# Patient Record
Sex: Male | Born: 1950 | Race: Black or African American | Hispanic: No | Marital: Single | Smoking: Current every day smoker
Health system: Southern US, Community
[De-identification: ages and names within clinical notes are randomized; demographics above are authoritative.]

## PROBLEM LIST (undated history)

## (undated) DIAGNOSIS — C3490 Malignant neoplasm of unspecified part of unspecified bronchus or lung: Secondary | ICD-10-CM

## (undated) DIAGNOSIS — B1920 Unspecified viral hepatitis C without hepatic coma: Secondary | ICD-10-CM

## (undated) DIAGNOSIS — J439 Emphysema, unspecified: Secondary | ICD-10-CM

## (undated) DIAGNOSIS — T7840XA Allergy, unspecified, initial encounter: Secondary | ICD-10-CM

## (undated) DIAGNOSIS — J449 Chronic obstructive pulmonary disease, unspecified: Secondary | ICD-10-CM

## (undated) DIAGNOSIS — F17210 Nicotine dependence, cigarettes, uncomplicated: Secondary | ICD-10-CM

## (undated) DIAGNOSIS — F1010 Alcohol abuse, uncomplicated: Secondary | ICD-10-CM

## (undated) DIAGNOSIS — N189 Chronic kidney disease, unspecified: Secondary | ICD-10-CM

## (undated) HISTORY — DX: Emphysema, unspecified: J43.9

## (undated) HISTORY — DX: Allergy, unspecified, initial encounter: T78.40XA

## (undated) HISTORY — DX: Chronic kidney disease, unspecified: N18.9

---

## 1998-02-26 ENCOUNTER — Emergency Department (HOSPITAL_COMMUNITY): Admission: EM | Admit: 1998-02-26 | Discharge: 1998-02-26 | Payer: Self-pay | Admitting: Emergency Medicine

## 1998-02-26 ENCOUNTER — Encounter: Payer: Self-pay | Admitting: Emergency Medicine

## 1998-03-04 ENCOUNTER — Emergency Department (HOSPITAL_COMMUNITY): Admission: EM | Admit: 1998-03-04 | Discharge: 1998-03-04 | Payer: Self-pay | Admitting: Emergency Medicine

## 1998-03-05 ENCOUNTER — Encounter: Payer: Self-pay | Admitting: Emergency Medicine

## 2004-06-02 ENCOUNTER — Emergency Department (HOSPITAL_COMMUNITY): Admission: EM | Admit: 2004-06-02 | Discharge: 2004-06-02 | Payer: Self-pay | Admitting: Emergency Medicine

## 2005-12-02 ENCOUNTER — Emergency Department (HOSPITAL_COMMUNITY): Admission: EM | Admit: 2005-12-02 | Discharge: 2005-12-02 | Payer: Self-pay | Admitting: Emergency Medicine

## 2006-11-13 ENCOUNTER — Emergency Department (HOSPITAL_COMMUNITY): Admission: EM | Admit: 2006-11-13 | Discharge: 2006-11-13 | Payer: Self-pay | Admitting: Emergency Medicine

## 2008-07-24 ENCOUNTER — Emergency Department (HOSPITAL_COMMUNITY): Admission: EM | Admit: 2008-07-24 | Discharge: 2008-07-25 | Payer: Self-pay | Admitting: Emergency Medicine

## 2008-07-25 ENCOUNTER — Inpatient Hospital Stay (HOSPITAL_COMMUNITY): Admission: RE | Admit: 2008-07-25 | Discharge: 2008-07-31 | Payer: Self-pay | Admitting: Psychiatry

## 2008-07-25 ENCOUNTER — Ambulatory Visit: Payer: Self-pay | Admitting: Psychiatry

## 2008-08-03 ENCOUNTER — Emergency Department (HOSPITAL_COMMUNITY): Admission: EM | Admit: 2008-08-03 | Discharge: 2008-08-04 | Payer: Self-pay | Admitting: Emergency Medicine

## 2008-08-05 ENCOUNTER — Inpatient Hospital Stay (HOSPITAL_COMMUNITY): Admission: EM | Admit: 2008-08-05 | Discharge: 2008-08-08 | Payer: Self-pay | Admitting: Psychiatry

## 2009-01-18 ENCOUNTER — Emergency Department (HOSPITAL_COMMUNITY): Admission: EM | Admit: 2009-01-18 | Discharge: 2009-01-19 | Payer: Self-pay | Admitting: Emergency Medicine

## 2009-01-19 ENCOUNTER — Ambulatory Visit: Payer: Self-pay | Admitting: Psychiatry

## 2009-01-19 ENCOUNTER — Inpatient Hospital Stay (HOSPITAL_COMMUNITY): Admission: AD | Admit: 2009-01-19 | Discharge: 2009-01-28 | Payer: Self-pay | Admitting: Psychiatry

## 2010-06-04 LAB — URINALYSIS, ROUTINE W REFLEX MICROSCOPIC
Glucose, UA: NEGATIVE mg/dL
Hgb urine dipstick: NEGATIVE
Nitrite: NEGATIVE
Protein, ur: NEGATIVE mg/dL
Specific Gravity, Urine: 1.026 (ref 1.005–1.030)
Urobilinogen, UA: 1 mg/dL (ref 0.0–1.0)
pH: 6 (ref 5.0–8.0)

## 2010-06-04 LAB — RAPID URINE DRUG SCREEN, HOSP PERFORMED
Amphetamines: NOT DETECTED
Barbiturates: NOT DETECTED
Benzodiazepines: NOT DETECTED
Opiates: NOT DETECTED

## 2010-06-04 LAB — DIFFERENTIAL
Basophils Relative: 1 % (ref 0–1)
Eosinophils Absolute: 0.1 10*3/uL (ref 0.0–0.7)
Monocytes Relative: 16 % — ABNORMAL HIGH (ref 3–12)
Neutrophils Relative %: 53 % (ref 43–77)

## 2010-06-04 LAB — BASIC METABOLIC PANEL
BUN: 8 mg/dL (ref 6–23)
CO2: 28 mEq/L (ref 19–32)
Chloride: 101 mEq/L (ref 96–112)
Creatinine, Ser: 1.15 mg/dL (ref 0.4–1.5)
Glucose, Bld: 123 mg/dL — ABNORMAL HIGH (ref 70–99)

## 2010-06-04 LAB — CBC
MCHC: 34.3 g/dL (ref 30.0–36.0)
MCV: 98.4 fL (ref 78.0–100.0)
Platelets: 62 10*3/uL — ABNORMAL LOW (ref 150–400)
RBC: 4.37 MIL/uL (ref 4.22–5.81)
RDW: 13.5 % (ref 11.5–15.5)

## 2010-06-09 LAB — DIFFERENTIAL
Basophils Absolute: 0.1 10*3/uL (ref 0.0–0.1)
Basophils Relative: 1 % (ref 0–1)
Eosinophils Absolute: 0.1 10*3/uL (ref 0.0–0.7)
Eosinophils Relative: 2 % (ref 0–5)
Lymphocytes Relative: 56 % — ABNORMAL HIGH (ref 12–46)

## 2010-06-09 LAB — URINALYSIS, ROUTINE W REFLEX MICROSCOPIC
Bilirubin Urine: NEGATIVE
Ketones, ur: NEGATIVE mg/dL
Nitrite: NEGATIVE
Protein, ur: NEGATIVE mg/dL
Urobilinogen, UA: 0.2 mg/dL (ref 0.0–1.0)

## 2010-06-09 LAB — ACETAMINOPHEN LEVEL: Acetaminophen (Tylenol), Serum: <10 ug/mL — ABNORMAL LOW (ref 10–30)

## 2010-06-09 LAB — BASIC METABOLIC PANEL
BUN: 9 mg/dL (ref 6–23)
GFR calc non Af Amer: >60 mL/min (ref >60)
Glucose, Bld: 89 mg/dL (ref 70–99)
Potassium: 3.9 mEq/L (ref 3.5–5.1)

## 2010-06-09 LAB — SALICYLATE LEVEL: Salicylate Lvl: <4 mg/dL (ref 2.8–20.0)

## 2010-06-09 LAB — RAPID URINE DRUG SCREEN, HOSP PERFORMED: Tetrahydrocannabinol: NOT DETECTED

## 2010-06-09 LAB — CBC
HCT: 40.2 % (ref 39.0–52.0)
MCV: 99.5 fL (ref 78.0–100.0)
Platelets: 98 10*3/uL — ABNORMAL LOW (ref 150–400)
RDW: 14 % (ref 11.5–15.5)

## 2010-06-10 LAB — COMPREHENSIVE METABOLIC PANEL
ALT: 90 U/L — ABNORMAL HIGH (ref 0–53)
Alkaline Phosphatase: 107 U/L (ref 39–117)
BUN: 5 mg/dL — ABNORMAL LOW (ref 6–23)
CO2: 25 mEq/L (ref 19–32)
GFR calc non Af Amer: >60 mL/min (ref >60)
Glucose, Bld: 99 mg/dL (ref 70–99)
Potassium: 4.1 mEq/L (ref 3.5–5.1)
Total Bilirubin: 0.8 mg/dL (ref 0.3–1.2)
Total Protein: 7.4 g/dL (ref 6.0–8.3)

## 2010-06-10 LAB — DIFFERENTIAL
Basophils Absolute: 0.1 10*3/uL (ref 0.0–0.1)
Basophils Relative: 1 % (ref 0–1)
Eosinophils Absolute: 0.1 10*3/uL (ref 0.0–0.7)
Monocytes Relative: 11 % (ref 3–12)
Neutro Abs: 2 10*3/uL (ref 1.7–7.7)
Neutrophils Relative %: 29 % — ABNORMAL LOW (ref 43–77)

## 2010-06-10 LAB — URINALYSIS, ROUTINE W REFLEX MICROSCOPIC
Nitrite: NEGATIVE
Specific Gravity, Urine: 1.012 (ref 1.005–1.030)
Urobilinogen, UA: 0.2 mg/dL (ref 0.0–1.0)
pH: 5.5 (ref 5.0–8.0)

## 2010-06-10 LAB — CBC
HCT: 43.8 % (ref 39.0–52.0)
Hemoglobin: 14.4 g/dL (ref 13.0–17.0)
RBC: 4.46 MIL/uL (ref 4.22–5.81)
RDW: 13.3 % (ref 11.5–15.5)

## 2010-06-10 LAB — RAPID URINE DRUG SCREEN, HOSP PERFORMED
Cocaine: NOT DETECTED
Opiates: NOT DETECTED

## 2010-06-10 LAB — ETHANOL: Alcohol, Ethyl (B): 300 mg/dL — ABNORMAL HIGH (ref 0–10)

## 2010-07-15 NOTE — H&P (Signed)
Chad Sparks, Chad Sparks              ACCOUNT NO.:  [id number]   MEDICAL RECORD NO.:  [id number]          PATIENT TYPE:  IPS   LOCATION:  0407                          FACILITY:  [facility]   PHYSICIAN:  Anselm Jungling, MD  DATE OF BIRTH:  1950-12-23   DATE OF ADMISSION:  08/05/2008  DATE OF DISCHARGE:                       PSYCHIATRIC ADMISSION ASSESSMENT   This is a voluntary admission to the services of Dr. Geralyn Flash.   Chad Sparks just left [place] on June 1.  He was also admitted at that time  with substance abuse issues, as again is his presentation today.  His  alcohol level is 225.  He does have benzodiazepines in his system.  He  was discharged on Ambien, so it is possible from that.  Chad Sparks  presented to the [facility] ED yesterday with a chief complaint of  medical clearance.  He states that he has been having increased  flashbacks and hallucinations, that he has suicidal ideations, that he  took 2 Prazosin tablets last night and 3 tablets this morning.  He took  this in an effort to fall asleep and stay that way.  He denied being  homicidal.  He denied drug use.  He admits to having drank 3 beers  earlier today, and he states that the reason for the ingestion was his  desire to sleep.  Patient is known to be an alcohol abuser if not  dependant, and he stated that he has been having auditory hallucinations  as well as visual hallucinations.  Due to his prior history, he was  admitted.   PAST PSYCHIATRIC HISTORY:  As already stated, he was most recently with  [place] from May 26 to June 1.  Again, he was admitted due to increasing  polysubstance abuse, reported auditory hallucinations, and possible  suicide risk.   FAMILY HISTORY:  None.   SOCIAL HISTORY:  He is homeless.  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   ALCOHOL/DRUG USE:  When last with [place] a week ago, he had been drinking  about a case of beer a day.  He denied any history for seizures or  blackouts.  He did not have any during his stay here.  He denied any  cocaine or marijuana use.   DRUG ALLERGIES:  He does not have any.   MEDICATIONS AT DISCHARGE:  He was prescribed Seroquel 100 mg q.h.s. and  Ambien 10 mg p.r.n.   POSITIVE PHYSICAL FINDINGS:  He was medically cleared in the ED at  [facility].  His urine was negative.  His UDS was positive for  benzodiazepines, but he is prescribed the Ambien.  His alcohol level was  225.  His electrolytes had no abnormalities.  His CBC  had no abnormality  of hemoglobin and hematocrit.   MENTAL STATUS EXAM:  Tonight, he was dressed in hospital scrubs.  He  appears to be adequately groomed and nourished.  He walks unaided.  His  speech is not pressured.  His mood is somewhat irritable.  Earlier in  the day when Dr. Electa Sniff saw him, he covered his head up with the  covers.  He stirred when he was spoken to but chose not to respond.  His  thought processes today are clear, rational, and goal-oriented.  He is  doing whatever it takes to get some type of income started.  Judgment  and insight are fair.  Concentration and memory superficially are  intact.  Intelligence is at least superficially intact.  He is not  actively suicidal or homicidal.  He is not responding to internal  stimuli nor does he give any appearance of having any thought disorder.   DIAGNOSES:   AXIS I:  1. Alcohol abuse.  2. History for polysubstance abuse.  3. Alcohol-induced schizo-affective disorder.   AXIS II:  Deferred.   AXIS III:  No acute or chronic illnesses are documented.   AXIS IV:  Severe stressors, financial,  etc.   AXIS V:  55.   PLAN:  Help detox from alcohol.  Towards that end, he was given Librium  25 mg p.o. q.h.s. p.r.n. withdrawal.  His discharge medications of  Seroquel 100 q.h.s. and Ambien 10 mg q.h.s. were continued.  He states  that he is prescribed Prazosin through the [place]; however, that was filled  last year.   Tomorrow, you can call [phone], extension [phone], to get his benefits  status to see if in fact he is service-connected or eligible for a non-  service-connected pension.  You can also check to see if he is eligible  for the substance abuse treatment program there called [facility].   Estimated length of stay is 3 to 5 days.      Chad Sparks, P.A.-C.      Anselm Jungling, MD  Electronically Signed    MD/MEDQ  D:  08/06/2008  T:  08/06/2008  Job:  [phone]

## 2010-07-15 NOTE — H&P (Signed)
NAMEJIAIRE, Chad Sparks              ACCOUNT NO.:  [id number]   MEDICAL RECORD NO.:  [id number]          PATIENT TYPE:  IPS   LOCATION:  0406                          FACILITY:  [facility]   PHYSICIAN:  Anselm Jungling, MD  DATE OF BIRTH:  10-Mar-1950   DATE OF ADMISSION:  07/24/2008  DATE OF DISCHARGE:                       PSYCHIATRIC ADMISSION ASSESSMENT   This is a 60 year old male voluntarily on Jul 24, 2008.   HISTORY OF PRESENT ILLNESS:  The patient is here with auditory  hallucinations and relapsed on alcohol.  States that he just started  back about on mother's day, drinking a case a day.  Last drink was  yesterday.  Also having suicidal ideation with a plan to shoot self in  the mouth.  He stated his  brother wrestled it away from him.  The  patient states that his brother found out that he was on the streets and  had a gun at that time.  The gun is not in the patient's possessions, as  stated per patient.  He has had a poor appetite, has lost some weight,  has been on medications prior but states that it makes his suicidal  thoughts worse.   PAST PSYCHIATRIC HISTORY:  First admission to the [facility].  No current outpatient mental health treatment.   SOCIAL HISTORY:  The patient considers himself homeless.  He has a poor  social support.   FAMILY HISTORY:  None.   ALCOHOL AND DRUG HISTORY:  As above.  Has been drinking about a case a  day.  Denies any seizures or blackouts.  Denies any substance use.  Primary care Filomeno Cromley is [facility].   MEDICAL PROBLEMS:  Hepatitis C.   MEDICATIONS:  Has been on Seroquel and albuterol in the past.   DRUG ALLERGIES:  ALLERGIC TO BEE STINGS.   PHYSICAL EXAMINATION:  GENERAL:  This is a disheveled thin male fully  assessed at [facility] emergency department.  The patient is in no  distress.  He offers no complaints other than feeling tired.  VITAL SIGNS:  Blood pressure is 123/86, 89 heart rate, O2 sat is 93%.   His urinalysis is negative.  CBC within normal limits.  Platelet count  of 142.  Urine drug screen is negative.  His CMET shows elevated liver  enzymes with an AST of 140, ALT of 90.   MENTAL STATUS EXAM:  The patient is in the bed.  He is disheveled.  Poor  eye contact.  He offers little information.  Speech is soft-spoken.  Again, the few words spoken are clear.  Mood is tired and depressed.  The patient's affect reflects mood.  Thought process - endorsing  auditory hallucinations.  Does not appear to be actively responding.  Answers he provides are coherent.  Cognitive function intact.  His  memory appears intact.  Judgment and insight are fair.   IMPRESSION:  AXIS I:  Psychosis, polysubstance abuse.  AXIS II:  Deferred.  AXIS III:  Hepatitis C.  AXIS IV:  Problems with housing and other psychosocial problems,  possible medical problems.  AXIS V:  Current is 35.   PLAN:  Our plan is to contract for safety, stabilize his mood and  thinking.  We will put patient on Librium protocol, work on relapse  prevention.  Continue to assess comorbidities, as well as Seroquel  available on a p.r.n. basis, and will identify his support group.  [facility].  Tentative length of stay at this time is 4-6 days.      Landry Corporal, N.P.      Anselm Jungling, MD  Electronically Signed    JO/MEDQ  D:  07/25/2008  T:  07/25/2008  Job:  [phone]

## 2010-07-15 NOTE — Discharge Summary (Signed)
Chad Sparks, BURGARD              ACCOUNT NO.:  [id number]   MEDICAL RECORD NO.:  [id number]          PATIENT TYPE:  IPS   LOCATION:  0406                          FACILITY:  [facility]   PHYSICIAN:  Anselm Jungling, MD  DATE OF BIRTH:  24-Jul-1950   DATE OF ADMISSION:  07/25/2008  DATE OF DISCHARGE:  07/31/2008                               DISCHARGE SUMMARY   IDENTIFYING DATA AND REASON FOR ADMISSION:  This was an inpatient  psychiatric admission for Brysin, a 60 year old unmarried African  American male who was admitted due to increasing polysubstance abuse,  depression, auditory hallucinations, with possible suicide risk.  Please  refer to the admission note for further details pertaining to the  symptoms, circumstances and history that led to his hospitalization.  He  was given an initial Axis I diagnosis of psychosis NOS, and  polysubstance abuse.   MEDICAL AND LABORATORY:  The patient was in good health without any  active or chronic medical problems.  He was medically and physically  assessed by the psychiatric nurse practitioner.  There were no  significant medical issues.   HOSPITAL COURSE:  The patient was admitted to the adult inpatient  psychiatric service.  He presented as a well-nourished, normally-  developed male who complained of feeling tired.  He was relatively  noncommunicative in the initial interview, remaining in bed, with the  covers up over his head, no eye contact, and only minimal verbal  responses.  However, he indicated that he was okay.   He agreed to Seroquel as a medication to help stabilize his sleep,  irritability and agitation.  He tolerated 100 mg nightly well.   He continued fairly withdrawn and guarded, but this did not appear to be  a function of psychosis.  In fact, it appeared to be more function of  his personality and characterologic issues.  The patient is apparently a  [place] Veteran with significant post-traumatic issues, and a strong  inclination against seeking services through the [facility].   He was fearful about going to a shelter and being homeless, but was  extremely particular about what form of housing situation we would find  for him.  We attempted to find resources for him through the [facility], but they were not able to work with him.   The patient was discharged on the seventh hospital day.  He stated that  he had a dental appointment in [facility] for August 01, 2008, and he was  very motivated to make that appointment, as he had significant dental  work to be done.  He consented to be discharged on the seventh hospital  day, even though we had not been able to arrange any housing for him  other than the shelter.  Nonetheless, this was his preference, to leave.   DISCHARGE AND AFTERCARE PLAN:  The patient was to follow-up for  medication management at [facility] with an  appointment to see their psychiatrist on August 02, 2008, at 9:00 a.m.   DISCHARGE MEDICATIONS:  1. Seroquel 100 mg nightly.  2. Ambien 10  mg h.s. p.r.n. insomnia.   DISCHARGE DIAGNOSES:  Axis I:  Schizoaffective disorder, not otherwise  specified and history of polysubstance abuse.  Axis II:  Deferred.  Axis III:  No acute or chronic illnesses.  Axis IV:  Stressors severe.  Axis V:  Global Assessment of Functioning on discharge 55.      Anselm Jungling, MD  Electronically Signed     SPB/MEDQ  D:  08/01/2008  T:  08/01/2008  Job:  [id number]

## 2010-07-18 NOTE — Discharge Summary (Signed)
NAMESHIRO, ELLERMAN              ACCOUNT NO.:  [id number]   MEDICAL RECORD NO.:  [id number]          PATIENT TYPE:  IPS   LOCATION:  0407                          FACILITY:  [facility]   PHYSICIAN:  Anselm Jungling, MD  DATE OF BIRTH:  Jan 18, 1951   DATE OF ADMISSION:  08/05/2008  DATE OF DISCHARGE:  08/08/2008                               DISCHARGE SUMMARY   IDENTIFYING DATA AND REASON FOR ADMISSION:  This was an inpatient  psychiatric admission for Jonavan, a 60 year old single African American  male who had just been discharged from our service 5 days prior to this  readmission.  He returned with a blood alcohol level of 225, and with  benzodiazepines in his system.  He re-presented to [facility]  Emergency Department.  Please refer to the admission note for further  details pertaining to the symptoms, circumstances and history that led  to his hospitalization.  He was given an initial Axis I diagnosis of  alcohol abuse, polysubstance abuse, and rule out schizoaffective  disorder.   MEDICAL AND LABORATORY:  The patient was medically and physically  assessed in the emergency department, and then reassessed by the  psychiatric nurse practitioner upon arrival on the inpatient psychiatry  service.  He was in generally good health without any active or chronic  medical problems.  There were no significant medical issues.   HOSPITAL COURSE:  The patient was admitted to the adult inpatient  psychiatric service.  He presented as a tall, adequately nourished adult  male who complained of auditory and visual hallucinations and suicidal  ideation, but did not appear to be psychotic in any way.  During the  initial interview he remained in bed with covers up over his head, and  although he appeared to be awake, chose not to respond.   He was treated with Librium on an as-needed basis for emergence of  alcohol withdrawal symptoms.  He was encouraged to participate in  therapeutic  groups and activities.   On the fourth hospital day, we were able to arrange for his transfer to  the [facility] in [place], [place].   DISCHARGE DIAGNOSES:  AXIS I:  Alcohol dependence not otherwise  specified and rule out schizoaffective disorder not otherwise specified.  AXIS II:  Deferred.  AXIS III:  No acute or chronic illnesses.  AXIS IV:  Stressors severe.  AXIS V: GAF on discharge 45.      Anselm Jungling, MD  Electronically Signed     SPB/MEDQ  D:  08/21/2008  T:  08/21/2008  Job:  [id number]

## 2010-12-12 LAB — URINALYSIS, ROUTINE W REFLEX MICROSCOPIC
Nitrite: NEGATIVE
Protein, ur: NEGATIVE
Specific Gravity, Urine: 1.023
Urobilinogen, UA: >8 — ABNORMAL HIGH

## 2010-12-12 LAB — DIFFERENTIAL
Basophils Relative: 1
Eosinophils Absolute: 0.1
Eosinophils Relative: 2
Lymphocytes Relative: 39
Neutro Abs: 1.8
Neutrophils Relative %: 38 — ABNORMAL LOW

## 2010-12-12 LAB — COMPREHENSIVE METABOLIC PANEL
ALT: 95 — ABNORMAL HIGH
Albumin: 3 — ABNORMAL LOW
Alkaline Phosphatase: 140 — ABNORMAL HIGH
Calcium: 8.9
Glucose, Bld: 88
Potassium: 4.3
Sodium: 136
Total Protein: 7.1

## 2010-12-12 LAB — URINE MICROSCOPIC-ADD ON

## 2010-12-12 LAB — CBC
MCHC: 34.5
Platelets: 77 — ABNORMAL LOW
RDW: 14.2 — ABNORMAL HIGH

## 2014-10-14 ENCOUNTER — Inpatient Hospital Stay (HOSPITAL_COMMUNITY)
Admission: EM | Admit: 2014-10-14 | Discharge: 2014-10-19 | DRG: 378 | Disposition: A | Discharge status: Skilled Nursing Facility | Payer: Medicare Other | Attending: Internal Medicine | Admitting: Internal Medicine

## 2014-10-14 ENCOUNTER — Encounter (HOSPITAL_COMMUNITY): Payer: Self-pay | Admitting: Emergency Medicine

## 2014-10-14 DIAGNOSIS — J449 Chronic obstructive pulmonary disease, unspecified: Secondary | ICD-10-CM | POA: Diagnosis present

## 2014-10-14 DIAGNOSIS — E872 Acidosis, unspecified: Secondary | ICD-10-CM

## 2014-10-14 DIAGNOSIS — D649 Anemia, unspecified: Secondary | ICD-10-CM

## 2014-10-14 DIAGNOSIS — R7401 Elevation of levels of liver transaminase levels: Secondary | ICD-10-CM

## 2014-10-14 DIAGNOSIS — F1020 Alcohol dependence, uncomplicated: Secondary | ICD-10-CM

## 2014-10-14 DIAGNOSIS — R740 Nonspecific elevation of levels of transaminase and lactic acid dehydrogenase [LDH]: Secondary | ICD-10-CM

## 2014-10-14 DIAGNOSIS — K922 Gastrointestinal hemorrhage, unspecified: Principal | ICD-10-CM | POA: Diagnosis present

## 2014-10-14 DIAGNOSIS — N183 Chronic kidney disease, stage 3 (moderate): Secondary | ICD-10-CM | POA: Diagnosis present

## 2014-10-14 DIAGNOSIS — F17210 Nicotine dependence, cigarettes, uncomplicated: Secondary | ICD-10-CM

## 2014-10-14 DIAGNOSIS — B1920 Unspecified viral hepatitis C without hepatic coma: Secondary | ICD-10-CM | POA: Diagnosis present

## 2014-10-14 DIAGNOSIS — J440 Chronic obstructive pulmonary disease with acute lower respiratory infection: Secondary | ICD-10-CM

## 2014-10-14 DIAGNOSIS — D696 Thrombocytopenia, unspecified: Secondary | ICD-10-CM | POA: Diagnosis present

## 2014-10-14 DIAGNOSIS — B1710 Acute hepatitis C without hepatic coma: Secondary | ICD-10-CM | POA: Diagnosis present

## 2014-10-14 DIAGNOSIS — N179 Acute kidney failure, unspecified: Secondary | ICD-10-CM | POA: Diagnosis present

## 2014-10-14 DIAGNOSIS — D61818 Other pancytopenia: Secondary | ICD-10-CM | POA: Diagnosis present

## 2014-10-14 DIAGNOSIS — D62 Acute posthemorrhagic anemia: Secondary | ICD-10-CM | POA: Diagnosis present

## 2014-10-14 DIAGNOSIS — K7031 Alcoholic cirrhosis of liver with ascites: Secondary | ICD-10-CM | POA: Diagnosis present

## 2014-10-14 DIAGNOSIS — R17 Unspecified jaundice: Secondary | ICD-10-CM

## 2014-10-14 DIAGNOSIS — F1010 Alcohol abuse, uncomplicated: Secondary | ICD-10-CM | POA: Diagnosis present

## 2014-10-14 HISTORY — DX: Alcohol abuse, uncomplicated: F10.10

## 2014-10-14 HISTORY — DX: Nicotine dependence, cigarettes, uncomplicated: F17.210

## 2014-10-14 HISTORY — DX: Chronic obstructive pulmonary disease, unspecified: J44.9

## 2014-10-14 HISTORY — DX: Unspecified viral hepatitis C without hepatic coma: B19.20

## 2014-10-14 LAB — CBC WITH DIFFERENTIAL/PLATELET
BASOS PCT: 0 % (ref 0–1)
Basophils Absolute: 0 10*3/uL (ref 0.0–0.1)
EOS PCT: 1 % (ref 0–5)
Eosinophils Absolute: 0 10*3/uL (ref 0.0–0.7)
HEMATOCRIT: 23 % — AB (ref 39.0–52.0)
HEMOGLOBIN: 7.8 g/dL — AB (ref 13.0–17.0)
Lymphocytes Relative: 44 % (ref 12–46)
Lymphs Abs: 1.7 10*3/uL (ref 0.7–4.0)
MCH: 33.8 pg (ref 26.0–34.0)
MCHC: 33.9 g/dL (ref 30.0–36.0)
MCV: 99.6 fL (ref 78.0–100.0)
MONO ABS: 0.4 10*3/uL (ref 0.1–1.0)
MONOS PCT: 11 % (ref 3–12)
NEUTROS PCT: 44 % (ref 43–77)
Neutro Abs: 1.7 10*3/uL (ref 1.7–7.7)
Platelets: 40 10*3/uL — ABNORMAL LOW (ref 150–400)
RBC: 2.31 MIL/uL — AB (ref 4.22–5.81)
RDW: 18.1 % — AB (ref 11.5–15.5)
WBC: 3.8 10*3/uL — AB (ref 4.0–10.5)

## 2014-10-14 LAB — COMPREHENSIVE METABOLIC PANEL
ALK PHOS: 114 U/L (ref 38–126)
ALT: 77 U/L — AB (ref 17–63)
ANION GAP: 9 (ref 5–15)
AST: 152 U/L — ABNORMAL HIGH (ref 15–41)
Albumin: 1.9 g/dL — ABNORMAL LOW (ref 3.5–5.0)
BILIRUBIN TOTAL: 2.5 mg/dL — AB (ref 0.3–1.2)
BUN: 12 mg/dL (ref 6–20)
CALCIUM: 8 mg/dL — AB (ref 8.9–10.3)
CHLORIDE: 110 mmol/L (ref 101–111)
CO2: 19 mmol/L — AB (ref 22–32)
CREATININE: 1.6 mg/dL — AB (ref 0.61–1.24)
GFR, EST AFRICAN AMERICAN: 51 mL/min — AB (ref >60)
GFR, EST NON AFRICAN AMERICAN: 44 mL/min — AB (ref >60)
Glucose, Bld: 125 mg/dL — ABNORMAL HIGH (ref 65–99)
POTASSIUM: 3.7 mmol/L (ref 3.5–5.1)
SODIUM: 138 mmol/L (ref 135–145)
Total Protein: 7.1 g/dL (ref 6.5–8.1)

## 2014-10-14 LAB — SAMPLE TO BLOOD BANK

## 2014-10-14 MED ORDER — SODIUM CHLORIDE 0.9 % IV SOLN
10.0000 mL/h | Freq: Once | INTRAVENOUS | Status: AC
  Administered 2014-10-15: 10 mL/h via INTRAVENOUS

## 2014-10-14 MED ORDER — PANTOPRAZOLE SODIUM 40 MG IV SOLR
40.0000 mg | Freq: Once | INTRAVENOUS | Status: AC
  Administered 2014-10-14: 40 mg via INTRAVENOUS
  Filled 2014-10-14: qty 40

## 2014-10-14 MED ORDER — SODIUM CHLORIDE 0.9 % IV BOLUS (SEPSIS)
1000.0000 mL | Freq: Once | INTRAVENOUS | Status: AC
  Administered 2014-10-14: 1000 mL via INTRAVENOUS

## 2014-10-14 NOTE — ED Provider Notes (Signed)
CSN: [id number]     Arrival date & time 10/14/14  2043 History   This chart was scribed for Varney Biles, MD by Forrestine Him, ED Scribe. This patient was seen in room D35C/D35C and the patient's care was started 11:33 PM.   Chief Complaint  Patient presents with  . Hematemesis  . Hematochezia   The history is provided by the patient. No language interpreter was used.    HPI Comments: Chad Sparks is a 64 y.o. male with a PMHx of hepatitis C and COPD who presents to the Emergency Department complaining of intermittent, ongoing hematemesis x 4 days. He reports 3 episodes of black colored emesis in the last 24 hours. Pt also reports 3 episodes of tarry/sticky hematochezia in the last day. Reports 5 episodes of each since onset of symptoms. No previous history of same. Last colonoscopy more than 2 years ago without any abnormalities. Chad Sparks admits to having an alcohol problem. States he consumes a few 40 ounce bottles of beer daily. Last drink earlier today. No previous history of ulcers. He is not currently on any anticoagulants or ASA. No known allergies to medications   PCP: [facility]  Past Medical History  Diagnosis Date  . Hepatitis C   . COPD (chronic obstructive pulmonary disease)   . Heavy cigarette smoker   . Alcohol abuse    History reviewed. No pertinent past surgical history. No family history on file. Social History  Substance Use Topics  . Smoking status: Current Every Day Smoker  . Smokeless tobacco: None  . Alcohol Use: Yes    Review of Systems  Constitutional: Negative for fever and chills.  Respiratory: Negative for cough and shortness of breath.   Gastrointestinal: Positive for vomiting, diarrhea and blood in stool. Negative for abdominal pain.  Musculoskeletal: Negative for arthralgias.  Skin: Positive for rash.  Neurological: Positive for weakness.  Psychiatric/Behavioral: Negative for confusion.  All other systems reviewed and are  negative.     Allergies  Review of patient's allergies indicates no known allergies.  Home Medications   Prior to Admission medications   Not on File   Triage Vitals: BP 141/93 mmHg  Pulse 101  Temp(Src) 98.8 F (37.1 C) (Oral)  Resp 13  SpO2 100%   Physical Exam  Constitutional: He is oriented to person, place, and time. He appears well-developed and well-nourished.  HENT:  Head: Normocephalic and atraumatic.  Positive oral thrush   Eyes: EOM are normal. Scleral icterus (mild) is present.  Neck: Normal range of motion.  Cardiovascular: Regular rhythm, normal heart sounds and intact distal pulses.  Tachycardia present.   Pulmonary/Chest: Effort normal and breath sounds normal. No respiratory distress.  Lungs are clear to ausculation   Abdominal: Soft. He exhibits no distension and no mass. There is no tenderness.  Genitourinary:  Melanotic stools   Musculoskeletal: Normal range of motion.  Neurological: He is alert and oriented to person, place, and time.  Skin: Skin is warm and dry.  Psychiatric: He has a normal mood and affect. Judgment normal.  Nursing note and vitals reviewed.   ED Course  Procedures (including critical care time)  DIAGNOSTIC STUDIES: Oxygen Saturation is 99% on RA, Normal  by my interpretation.    COORDINATION OF CARE: 11:42 PM- Wil order CBC, CMP,  APTT, i-stat CG4 lactic acid, and PT-INR. Will give Ativan, Vitamin B-1, Sandostatin. Discussed treatment plan with pt at bedside and pt agreed to plan.     Labs Review Labs  Reviewed  CBC WITH DIFFERENTIAL/PLATELET - Abnormal; Notable for the following:    WBC 3.8 (*)    RBC 2.31 (*)    Hemoglobin 7.8 (*)    HCT 23.0 (*)    RDW 18.1 (*)    Platelets 40 (*)    All other components within normal limits  COMPREHENSIVE METABOLIC PANEL - Abnormal; Notable for the following:    CO2 19 (*)    Glucose, Bld 125 (*)    Creatinine, Ser 1.60 (*)    Calcium 8.0 (*)    Albumin 1.9 (*)    AST 152 (*)     ALT 77 (*)    Total Bilirubin 2.5 (*)    GFR calc non Af Amer 44 (*)    GFR calc Af Amer 51 (*)    All other components within normal limits  APTT - Abnormal; Notable for the following:    aPTT 45 (*)    All other components within normal limits  PROTIME-INR - Abnormal; Notable for the following:    Prothrombin Time 19.9 (*)    INR 1.69 (*)    All other components within normal limits  I-STAT CG4 LACTIC ACID, ED - Abnormal; Notable for the following:    Lactic Acid, Venous 3.46 (*)    All other components within normal limits  POC OCCULT BLOOD, ED - Abnormal; Notable for the following:    Fecal Occult Bld POSITIVE (*)    All other components within normal limits  HEPATITIS PANEL, ACUTE  HCV RNA QUANT  BILIRUBIN, DIRECT  CBC  CBC  CBC  CBC  LACTIC ACID, PLASMA  SAMPLE TO BLOOD BANK  TYPE AND SCREEN  PREPARE RBC (CROSSMATCH)  ABO/RH    Imaging Review No results found. I personally reviewed and evaluated these images and lab results as part of my medical decision-making.   EKG Interpretation None      MDM   Final diagnoses:  Acute upper GI bleed  Symptomatic anemia  Alcoholism  Elevated transaminase level  Elevated bilirubin  Acute hepatitis C virus infection without hepatic coma  Chronic obstructive pulmonary disease with acute lower respiratory infection  Heavy cigarette smoker  Lactic acidosis    CRITICAL CARE Performed by: Varney Biles   Total critical care time: 37 minutes  Critical care time was exclusive of separately billable procedures and treating other patients.  Critical care was necessary to treat or prevent imminent or life-threatening deterioration.  Critical care was time spent personally by me on the following activities: development of treatment plan with patient and/or surrogate as well as nursing, discussions with consultants, evaluation of patient's response to treatment, examination of patient, obtaining history from patient  or surrogate, ordering and performing treatments and interventions, ordering and review of laboratory studies, ordering and review of radiographic studies, pulse oximetry and re-evaluation of patient's condition.   Pt comes in with cc of GI bleed.  DDx includes: Esophagitis Mallory Weiss tear [place]  Variceal bleeding PUD/Gastritis/ulcers Diverticular bleed Colon cancer Rectal bleed Internal hemorrhoids External hemorrhoids  Pt with cc of GI bleed - melena and hematochezia. Hx of liver alcoholism and hepatitis. He has melena on our exam and Hb is low. Pt has malaise with a Hb of 7.2. Will transfuse- suspect upper GI bleed right now. We will give him protonix iv 80 mg bolus and also start him on octreotide, as varices are possible. Will give him vit k as well.    Varney Biles, MD 10/15/14 [phone]

## 2014-10-14 NOTE — ED Notes (Addendum)
Pt. reports multiple bloody emesis and diarrhea onset last week with fatigue and generalized weakness , denies in jury , no abdominal pain / no fever. + ETOH today .

## 2014-10-15 ENCOUNTER — Encounter (HOSPITAL_COMMUNITY): Payer: Self-pay | Admitting: Internal Medicine

## 2014-10-15 ENCOUNTER — Encounter (HOSPITAL_COMMUNITY)
Admission: EM | Disposition: A | Discharge status: Skilled Nursing Facility | Payer: Self-pay | Source: Home / Self Care | Attending: Internal Medicine

## 2014-10-15 ENCOUNTER — Inpatient Hospital Stay (HOSPITAL_COMMUNITY): Payer: Medicare Other | Admitting: Anesthesiology

## 2014-10-15 ENCOUNTER — Inpatient Hospital Stay (HOSPITAL_COMMUNITY): Payer: Medicare Other

## 2014-10-15 DIAGNOSIS — K922 Gastrointestinal hemorrhage, unspecified: Principal | ICD-10-CM | POA: Insufficient documentation

## 2014-10-15 DIAGNOSIS — B1710 Acute hepatitis C without hepatic coma: Secondary | ICD-10-CM | POA: Insufficient documentation

## 2014-10-15 DIAGNOSIS — K7031 Alcoholic cirrhosis of liver with ascites: Secondary | ICD-10-CM | POA: Diagnosis present

## 2014-10-15 DIAGNOSIS — E872 Acidosis: Secondary | ICD-10-CM | POA: Diagnosis present

## 2014-10-15 DIAGNOSIS — D61818 Other pancytopenia: Secondary | ICD-10-CM | POA: Diagnosis present

## 2014-10-15 DIAGNOSIS — N183 Chronic kidney disease, stage 3 (moderate): Secondary | ICD-10-CM | POA: Diagnosis present

## 2014-10-15 DIAGNOSIS — J449 Chronic obstructive pulmonary disease, unspecified: Secondary | ICD-10-CM

## 2014-10-15 DIAGNOSIS — F1010 Alcohol abuse, uncomplicated: Secondary | ICD-10-CM

## 2014-10-15 DIAGNOSIS — N179 Acute kidney failure, unspecified: Secondary | ICD-10-CM | POA: Diagnosis present

## 2014-10-15 DIAGNOSIS — B1920 Unspecified viral hepatitis C without hepatic coma: Secondary | ICD-10-CM | POA: Diagnosis present

## 2014-10-15 DIAGNOSIS — D62 Acute posthemorrhagic anemia: Secondary | ICD-10-CM | POA: Diagnosis present

## 2014-10-15 DIAGNOSIS — F17210 Nicotine dependence, cigarettes, uncomplicated: Secondary | ICD-10-CM

## 2014-10-15 DIAGNOSIS — D696 Thrombocytopenia, unspecified: Secondary | ICD-10-CM | POA: Diagnosis present

## 2014-10-15 DIAGNOSIS — K921 Melena: Secondary | ICD-10-CM

## 2014-10-15 DIAGNOSIS — F1020 Alcohol dependence, uncomplicated: Secondary | ICD-10-CM | POA: Diagnosis present

## 2014-10-15 HISTORY — PX: ESOPHAGOGASTRODUODENOSCOPY (EGD) WITH PROPOFOL: SHX5813

## 2014-10-15 LAB — COMPREHENSIVE METABOLIC PANEL
ALBUMIN: 1.6 g/dL — AB (ref 3.5–5.0)
ALT: 58 U/L (ref 17–63)
AST: 115 U/L — AB (ref 15–41)
Alkaline Phosphatase: 92 U/L (ref 38–126)
Anion gap: 9 (ref 5–15)
BILIRUBIN TOTAL: 2.9 mg/dL — AB (ref 0.3–1.2)
BUN: 10 mg/dL (ref 6–20)
CHLORIDE: 116 mmol/L — AB (ref 101–111)
CO2: 17 mmol/L — AB (ref 22–32)
Calcium: 7.3 mg/dL — ABNORMAL LOW (ref 8.9–10.3)
Creatinine, Ser: 1.53 mg/dL — ABNORMAL HIGH (ref 0.61–1.24)
GFR calc Af Amer: 53 mL/min — ABNORMAL LOW (ref >60)
GFR calc non Af Amer: 46 mL/min — ABNORMAL LOW (ref >60)
GLUCOSE: 81 mg/dL (ref 65–99)
POTASSIUM: 4.1 mmol/L (ref 3.5–5.1)
Sodium: 142 mmol/L (ref 135–145)
Total Protein: 5.6 g/dL — ABNORMAL LOW (ref 6.5–8.1)

## 2014-10-15 LAB — RAPID URINE DRUG SCREEN, HOSP PERFORMED
Amphetamines: NOT DETECTED
BARBITURATES: NOT DETECTED
Benzodiazepines: NOT DETECTED
COCAINE: NOT DETECTED
Opiates: NOT DETECTED
Tetrahydrocannabinol: POSITIVE — AB

## 2014-10-15 LAB — CBC
HCT: 28.7 % — ABNORMAL LOW (ref 39.0–52.0)
HEMOGLOBIN: 10 g/dL — AB (ref 13.0–17.0)
MCH: 32.4 pg (ref 26.0–34.0)
MCHC: 34.8 g/dL (ref 30.0–36.0)
MCV: 92.9 fL (ref 78.0–100.0)
Platelets: 30 10*3/uL — ABNORMAL LOW (ref 150–400)
RBC: 3.09 MIL/uL — AB (ref 4.22–5.81)
RDW: 21.2 % — ABNORMAL HIGH (ref 11.5–15.5)
WBC: 5.3 10*3/uL (ref 4.0–10.5)

## 2014-10-15 LAB — PREPARE RBC (CROSSMATCH)

## 2014-10-15 LAB — PROTIME-INR
INR: 1.69 — ABNORMAL HIGH (ref 0.00–1.49)
INR: 1.87 — ABNORMAL HIGH (ref 0.00–1.49)
Prothrombin Time: 19.9 seconds — ABNORMAL HIGH (ref 11.6–15.2)
Prothrombin Time: 21.4 seconds — ABNORMAL HIGH (ref 11.6–15.2)

## 2014-10-15 LAB — APTT
aPTT: 45 seconds — ABNORMAL HIGH (ref 24–37)
aPTT: 45 seconds — ABNORMAL HIGH (ref 24–37)

## 2014-10-15 LAB — POC OCCULT BLOOD, ED: Fecal Occult Bld: POSITIVE — AB

## 2014-10-15 LAB — CREATININE, URINE, RANDOM: Creatinine, Urine: 205.91 mg/dL

## 2014-10-15 LAB — MRSA PCR SCREENING: MRSA BY PCR: NEGATIVE

## 2014-10-15 LAB — I-STAT CG4 LACTIC ACID, ED: LACTIC ACID, VENOUS: 3.46 mmol/L — AB (ref 0.5–2.0)

## 2014-10-15 LAB — ABO/RH: ABO/RH(D): O POS

## 2014-10-15 LAB — SODIUM, URINE, RANDOM: Sodium, Ur: 91 mmol/L

## 2014-10-15 LAB — BILIRUBIN, DIRECT: Bilirubin, Direct: 1.6 mg/dL — ABNORMAL HIGH (ref 0.1–0.5)

## 2014-10-15 SURGERY — ESOPHAGOGASTRODUODENOSCOPY (EGD) WITH PROPOFOL
Anesthesia: Monitor Anesthesia Care

## 2014-10-15 MED ORDER — NICOTINE 21 MG/24HR TD PT24
21.0000 mg | MEDICATED_PATCH | Freq: Every day | TRANSDERMAL | Status: DC
  Filled 2014-10-15 (×4): qty 1

## 2014-10-15 MED ORDER — LIDOCAINE HCL (CARDIAC) 20 MG/ML IV SOLN
INTRAVENOUS | Status: DC | PRN
  Administered 2014-10-15: 50 mg via INTRAVENOUS

## 2014-10-15 MED ORDER — SODIUM CHLORIDE 0.9 % IV SOLN
50.0000 ug/h | INTRAVENOUS | Status: DC
  Administered 2014-10-15 (×2): 50 ug/h via INTRAVENOUS
  Filled 2014-10-15 (×6): qty 1

## 2014-10-15 MED ORDER — SODIUM CHLORIDE 0.9 % IJ SOLN
3.0000 mL | Freq: Two times a day (BID) | INTRAMUSCULAR | Status: DC
  Administered 2014-10-15 – 2014-10-17 (×7): 3 mL via INTRAVENOUS
  Administered 2014-10-18: 10 mL via INTRAVENOUS
  Administered 2014-10-18 – 2014-10-19 (×2): 3 mL via INTRAVENOUS

## 2014-10-15 MED ORDER — ALBUTEROL SULFATE (2.5 MG/3ML) 0.083% IN NEBU: 2.5000 mg | INHALATION_SOLUTION | Freq: Four times a day (QID) | RESPIRATORY_TRACT | Status: DC | PRN

## 2014-10-15 MED ORDER — LORAZEPAM 2 MG/ML IJ SOLN
0.0000 mg | Freq: Four times a day (QID) | INTRAMUSCULAR | Status: AC
  Administered 2014-10-15: 1 mg via INTRAVENOUS
  Filled 2014-10-15: qty 1

## 2014-10-15 MED ORDER — PANTOPRAZOLE SODIUM 40 MG IV SOLR
40.0000 mg | Freq: Two times a day (BID) | INTRAVENOUS | Status: DC
  Administered 2014-10-15 – 2014-10-19 (×10): 40 mg via INTRAVENOUS
  Filled 2014-10-15 (×12): qty 40

## 2014-10-15 MED ORDER — LACTATED RINGERS IV SOLN
INTRAVENOUS | Status: DC | PRN
  Administered 2014-10-15: 11:00:00 via INTRAVENOUS

## 2014-10-15 MED ORDER — OCTREOTIDE LOAD VIA INFUSION
50.0000 ug | Freq: Once | INTRAVENOUS | Status: AC
  Administered 2014-10-15: 50 ug via INTRAVENOUS
  Filled 2014-10-15: qty 25

## 2014-10-15 MED ORDER — SODIUM CHLORIDE 0.9 % IV BOLUS (SEPSIS)
1500.0000 mL | Freq: Once | INTRAVENOUS | Status: AC
  Administered 2014-10-15: 1500 mL via INTRAVENOUS

## 2014-10-15 MED ORDER — FENTANYL CITRATE (PF) 100 MCG/2ML IJ SOLN
INTRAMUSCULAR | Status: DC | PRN
  Administered 2014-10-15 (×2): 50 ug via INTRAVENOUS

## 2014-10-15 MED ORDER — SODIUM CHLORIDE 0.9 % IV SOLN: INTRAVENOUS | Status: DC

## 2014-10-15 MED ORDER — MIDAZOLAM HCL 5 MG/5ML IJ SOLN
INTRAMUSCULAR | Status: DC | PRN
  Administered 2014-10-15: 1 mg via INTRAVENOUS

## 2014-10-15 MED ORDER — THIAMINE HCL 100 MG/ML IJ SOLN
100.0000 mg | Freq: Every day | INTRAMUSCULAR | Status: DC
  Administered 2014-10-15 – 2014-10-17 (×3): 100 mg via INTRAVENOUS
  Filled 2014-10-15 (×3): qty 1
  Filled 2014-10-15: qty 2
  Filled 2014-10-15: qty 1

## 2014-10-15 MED ORDER — VITAMIN B-1 100 MG PO TABS
100.0000 mg | ORAL_TABLET | Freq: Every day | ORAL | Status: DC
  Administered 2014-10-18 – 2014-10-19 (×2): 100 mg via ORAL
  Filled 2014-10-15 (×5): qty 1

## 2014-10-15 MED ORDER — LORAZEPAM 2 MG/ML IJ SOLN: 0.0000 mg | Freq: Two times a day (BID) | INTRAMUSCULAR | Status: AC

## 2014-10-15 MED ORDER — MORPHINE SULFATE 2 MG/ML IJ SOLN: 2.0000 mg | INTRAMUSCULAR | Status: DC | PRN

## 2014-10-15 MED ORDER — SODIUM CHLORIDE 0.9 % IV SOLN
INTRAVENOUS | Status: DC
  Administered 2014-10-15: 05:00:00 via INTRAVENOUS

## 2014-10-15 MED ORDER — PROPOFOL 10 MG/ML IV BOLUS
INTRAVENOUS | Status: DC | PRN
  Administered 2014-10-15: 20 mg via INTRAVENOUS

## 2014-10-15 MED ORDER — VITAMIN K1 10 MG/ML IJ SOLN
10.0000 mg | Freq: Once | INTRAMUSCULAR | Status: AC
  Administered 2014-10-15: 10 mg via SUBCUTANEOUS
  Filled 2014-10-15 (×2): qty 1

## 2014-10-15 MED ORDER — BUTAMBEN-TETRACAINE-BENZOCAINE 2-2-14 % EX AERO
INHALATION_SPRAY | CUTANEOUS | Status: DC | PRN
  Administered 2014-10-15: 2 via TOPICAL

## 2014-10-15 MED ORDER — ONDANSETRON HCL 4 MG/2ML IJ SOLN
4.0000 mg | Freq: Four times a day (QID) | INTRAMUSCULAR | Status: DC | PRN
  Administered 2014-10-15: 4 mg via INTRAVENOUS
  Filled 2014-10-15: qty 2

## 2014-10-15 MED ORDER — ONDANSETRON HCL 4 MG PO TABS: 4.0000 mg | ORAL_TABLET | Freq: Four times a day (QID) | ORAL | Status: DC | PRN

## 2014-10-15 NOTE — Transfer of Care (Signed)
Immediate Anesthesia Transfer of Care Note  Patient: Chad Sparks  Procedure(s) Performed: Procedure(s): ESOPHAGOGASTRODUODENOSCOPY (EGD) WITH PROPOFOL (N/A)  Patient Location: PACU and Endoscopy Unit  Anesthesia Type:MAC  Level of Consciousness: sedated, patient cooperative and responds to stimulation  Airway & Oxygen Therapy: Patient Spontanous Breathing and Patient connected to nasal cannula oxygen  Post-op Assessment: Report given to RN, Post -op Vital signs reviewed and stable and Patient moving all extremities  Post vital signs: Reviewed and stable  Last Vitals:  Filed Vitals:   10/15/14 1043  BP: 131/80  Pulse: 80  Temp: 36.8 C  Resp: 13    Complications: No apparent anesthesia complications

## 2014-10-15 NOTE — Clinical Documentation Improvement (Signed)
  Please clarify if patient has indications of symptoms associated with any possible condition listed.  _____ABLA _____Other Not able to determine  On admission pt. presents with hematochezia and hematemesis, has generalized weakness, Melena, positive FOBT, Hgb. 7.8 Albumin 1.9 HR 101 Treatment: IVF: 2.5 L NS and then 100 cc/h - 2U of blood transfusion    Thank you,  Philippa Chester ,RN Clinical Documentation Specialist:  [place] [employer] [facility]

## 2014-10-15 NOTE — Consult Note (Signed)
Subjective:   HPI  The patient is a 64 year old male who presented to the emergency room last night with complaints of melena and coffee-ground emesis. He started to experience these a few days ago. He was found to be anemic. His stools were heme positive. He denies heartburn or abdominal pain. He does drink a lot of alcohol. He has a history of hepatitis C. He reports he had a colonoscopy a few years ago and it was normal. This was done in another city.  Review of Systems Currently denies chest pain or shortness of breath  Past Medical History  Diagnosis Date  . Hepatitis C   . COPD (chronic obstructive pulmonary disease)   . Heavy cigarette smoker   . Alcohol abuse    History reviewed. No pertinent past surgical history. Social History   Social History  . Marital Status: Single    Spouse Name: N/A  . Number of Children: N/A  . Years of Education: N/A   Occupational History  . Not on file.   Social History Main Topics  . Smoking status: Current Every Day Smoker  . Smokeless tobacco: Not on file  . Alcohol Use: Yes  . Drug Use: No  . Sexual Activity: Not on file   Other Topics Concern  . Not on file   Social History Narrative   family history includes Brain cancer in his brother; Emphysema in his father; Throat cancer in his mother.  Current facility-administered medications:  .  0.9 %  sodium chloride infusion, , Intravenous, Continuous, Domenic Polite, MD, Last Rate: 75 mL/hr at 10/15/14 0741 .  albuterol (PROVENTIL) (2.5 MG/3ML) 0.083% nebulizer solution 2.5 mg, 2.5 mg, Nebulization, Q6H PRN, Ivor Costa, MD .  LORazepam (ATIVAN) injection 0-4 mg, 0-4 mg, Intravenous, 4 times per day, 1 mg at 10/15/14 0054 **FOLLOWED BY** [START ON 10/17/2014] LORazepam (ATIVAN) injection 0-4 mg, 0-4 mg, Intravenous, Q12H, Ankit Nanavati, MD .  morphine 2 MG/ML injection 2 mg, 2 mg, Intravenous, Q4H PRN, Ivor Costa, MD .  nicotine (NICODERM CQ - dosed in mg/24 hours) patch 21 mg, 21 mg,  Transdermal, Daily, Ivor Costa, MD .  [COMPLETED] octreotide (SANDOSTATIN) 2 mcg/mL load via infusion 50 mcg, 50 mcg, Intravenous, Once, 50 mcg at 10/15/14 0049 **AND** octreotide (SANDOSTATIN) 500 mcg in sodium chloride 0.9 % 250 mL (2 mcg/mL) infusion, 50 mcg/hr, Intravenous, Continuous, Ankit Nanavati, MD, Last Rate: 25 mL/hr at 10/15/14 0045, 50 mcg/hr at 10/15/14 0045 .  ondansetron (ZOFRAN) tablet 4 mg, 4 mg, Oral, Q6H PRN **OR** ondansetron (ZOFRAN) injection 4 mg, 4 mg, Intravenous, Q6H PRN, Ivor Costa, MD .  pantoprazole (PROTONIX) injection 40 mg, 40 mg, Intravenous, Q12H, Ivor Costa, MD, 40 mg at 10/15/14 0334 .  sodium chloride 0.9 % injection 3 mL, 3 mL, Intravenous, Q12H, Ivor Costa, MD, 3 mL at 10/15/14 0330 .  thiamine (VITAMIN B-1) tablet 100 mg, 100 mg, Oral, Daily **OR** thiamine (B-1) injection 100 mg, 100 mg, Intravenous, Daily, Ankit Nanavati, MD No Known Allergies   Objective:     BP 138/96 mmHg  Pulse 82  Temp(Src) 98.6 F (37 C) (Oral)  Resp 14  Ht '6\' 2"'$  (1.88 m)  Wt 71.5 kg (157 lb 10.1 oz)  BMI 20.23 kg/m2  SpO2 100%  He is in no acute distress  Nonicteric  Heart regular rhythm no murmurs  Lungs clear  Abdomen: Bowel sounds normal, soft, nontender  Laboratory No components found for: D1    Assessment:     Gastrointestinal  bleeding. Given the nature of coffee-ground emesis and melena this is most likely upper. With his history of alcohol abuse he could have alcoholic gastritis or peptic ulcer disease.  History of hepatitis C      Plan:     He has received 2 units of packed red cells. Repeat H&H will be done. PPI therapy. We will plan EGD later today. Lab Results  Component Value Date   HGB 7.8* 10/14/2014   HGB 14.8 01/18/2009   HGB 13.5 08/03/2008   HCT 23.0* 10/14/2014   HCT 43.0 01/18/2009   HCT 40.2 08/03/2008   ALKPHOS 114 10/14/2014   ALKPHOS 107 07/24/2008   ALKPHOS 140* 11/13/2006   AST 152* 10/14/2014   AST 140* 07/24/2008   AST  182* 11/13/2006   ALT 77* 10/14/2014   ALT 90* 07/24/2008   ALT 95* 11/13/2006

## 2014-10-15 NOTE — Progress Notes (Signed)
Initial Nutrition Assessment  DOCUMENTATION CODES:   Not applicable  INTERVENTION:   Advance diet as medically appropriate, RD to add interventions accordingly  NUTRITION DIAGNOSIS:   Inadequate oral intake related to inability to eat as evidenced by NPO status  GOAL:   Patient will meet greater than or equal to 90% of their needs  MONITOR:   Diet advancement, Labs, Weight trends, I & O's  REASON FOR ASSESSMENT:   Malnutrition Screening Tool  ASSESSMENT:   64 y.o. Male with PMH of hepatitis C, COPD, tobacco abuse, alcohol abuse, who presents with hematochezia and hematemesis.  Pt currently in ENDOSCOPY.  Per Malnutrition Screening Tool Report, with recent weight loss and has been eating poorly because of a decreased appetite.  Currently NPO.  Nutrient needs increased given chronic illness/PMH.  RD unable to complete Nutrition Focused Physical Exam at this time.  RD suspects malnutrition, however, unable to identify at this time.  Diet Order:  Diet regular Room service appropriate?: Yes; Fluid consistency:: Thin  Skin:  Reviewed, no issues  Last BM:  PTA  Height:   Ht Readings from Last 1 Encounters:  10/15/14 '6\' 2"'$  (1.88 m)    Weight:   Wt Readings from Last 1 Encounters:  10/15/14 157 lb 10.1 oz (71.5 kg)    Ideal Body Weight:  86.3 kg  BMI:  Body mass index is 20.23 kg/(m^2).  Estimated Nutritional Needs:   Kcal:  1800-2000  Protein:  90-100 gm  Fluid:  1.8-2.0 L  EDUCATION NEEDS:   No education needs identified at this time  Arthur Holms, RD, LDN Pager #: [phone] After-Hours Pager #: [phone]

## 2014-10-15 NOTE — H&P (Signed)
[facility] History and Physical  Chad Sparks OHY:[id number] DOB: 1951/02/16 DOA: 10/14/2014  Referring physician: ED physician PCP: No primary care provider on file.  Specialists:   Chief Complaint: Hematochezia and hematemesis  HPI: Chad Sparks is a 64 y.o. male with PMH of hepatitis C, COPD, tobacco abuse, alcohol abuse, who presents with hematochezia and hematemesis.  Patient reports that he has been having hematemesis and are stool intermittently in the past 4 days.   He reports 3 episodes of black colored emesis in the last 24 hours. Pt also reports 3 episodes of tarry yesterday. No previous history of same. Last colonoscopy was more than 2 years ago without any abnormalities per pt. Pt admits to having an alcohol problem. States he consumes a few 40 ounce bottles of beer daily. Last drink earlier today. No previous history of ulcers. He is not currently on any anticoagulants, NSAIDs or ASA. Patient does not have chest pain, shortness of breath, dizziness, abdominal pain, symptoms of UTI. No unilateral weakness, but has generalized weakness.  In ED, patient was found to have positive FOBT, hemoglobin drop from 14.8 on 01/18/09 to 7.8, WBC 3.8, platelet of 40, AKI, temperature 90.9, tachycardia, lactate is 3.46, INR 1.69, PTT 45, abnormal liver function with ALP 114, AST 152, ALT 77, total bilirubin 2.5. Patient is admitted to inpatient for further evaluation and treatment. GI was consulted by ED.  Where does patient live?   At home    Can patient participate in ADLs?  Yes     Review of Systems:   General: no fevers, chills, no changes in body weight, has fatigue HEENT: no blurry vision, hearing changes or sore throat Pulm: no dyspnea, coughing, wheezing CV: no chest pain, palpitations Abd: no nausea, vomiting, abdominal pain, diarrhea, constipation. Has hematemesis and hematochezia. GU: no dysuria, burning on urination, increased urinary frequency, hematuria  Ext:  no leg edema Neuro: no unilateral weakness, numbness, or tingling, no vision change or hearing loss Skin: no rash MSK: No muscle spasm, no deformity, no limitation of range of movement in spin Heme: No easy bruising.  Travel history: No recent long distant travel.  Allergy: No Known Allergies  Past Medical History  Diagnosis Date  . Hepatitis C   . COPD (chronic obstructive pulmonary disease)   . Heavy cigarette smoker   . Alcohol abuse     History reviewed. No pertinent past surgical history.  Social History:  reports that he has been smoking.  He does not have any smokeless tobacco history on file. He reports that he drinks alcohol. He reports that he does not use illicit drugs.  Family History:  Family History  Problem Relation Age of Onset  . Throat cancer Mother   . Emphysema Father   . Brain cancer Brother      Prior to Admission medications   Not on File    Physical Exam: Filed Vitals:   10/15/14 0100 10/15/14 0125 10/15/14 0226 10/15/14 0322  BP: 141/93 128/87 126/82 126/87  Pulse: 101 95  91  Temp:  98.6 F (37 C) 98.4 F (36.9 C) 98.2 F (36.8 C)  TempSrc:  Oral Oral Oral  Resp: '13 16  20  '$ Height:   '6\' 2"'$  (1.88 m)   Weight:   71.5 kg (157 lb 10.1 oz)   SpO2: 100% 100%  100%   General: Not in acute distress.  HEENT:       Eyes: PERRL, EOMI, no scleral icterus.  ENT: No discharge from the ears and nose, no pharynx injection, no tonsillar enlargement.        Neck: No JVD, no bruit, no mass felt. Heme: No neck lymph node enlargement. Cardiac: S1/S2, RRR, No murmurs, No gallops or rubs. Pulm: Good air movement bilaterally. No rales, wheezing, rhonchi or rubs. Abd: Soft, nondistended, nontender, no rebound pain, no organomegaly, BS present. Ext: No pitting leg edema bilaterally. 2+DP/PT pulse bilaterally. Musculoskeletal: No joint deformities, No joint redness or warmth, no limitation of ROM in spin. Skin: No rashes.  Neuro: Alert, oriented X3,  cranial nerves II-XII grossly intact, muscle strength 5/5 in all extremities, sensation to light touch intact.  Psych: Patient is not psychotic, no suicidal or hemocidal ideation.  Labs on Admission:  Basic Metabolic Panel:  Recent Labs Lab 10/14/14 2103  NA 138  K 3.7  CL 110  CO2 19*  GLUCOSE 125*  BUN 12  CREATININE 1.60*  CALCIUM 8.0*   Liver Function Tests:  Recent Labs Lab 10/14/14 2103  AST 152*  ALT 77*  ALKPHOS 114  BILITOT 2.5*  PROT 7.1  ALBUMIN 1.9*   No results for input(s): LIPASE, AMYLASE in the last 168 hours. No results for input(s): AMMONIA in the last 168 hours. CBC:  Recent Labs Lab 10/14/14 2103  WBC 3.8*  NEUTROABS 1.7  HGB 7.8*  HCT 23.0*  MCV 99.6  PLT 40*   Cardiac Enzymes: No results for input(s): CKTOTAL, CKMB, CKMBINDEX, TROPONINI in the last 168 hours.  BNP (last 3 results) No results for input(s): BNP in the last 8760 hours.  ProBNP (last 3 results) No results for input(s): PROBNP in the last 8760 hours.  CBG: No results for input(s): GLUCAP in the last 168 hours.  Radiological Exams on Admission: Dg Chest Portable 1 View  10/15/2014   CLINICAL DATA:  Pre-transfusion chest radiograph. Initial encounter.  EXAM: PORTABLE CHEST - 1 VIEW  COMPARISON:  None.  FINDINGS: The lungs are well-aerated and clear. There is no evidence of focal opacification, pleural effusion or pneumothorax.  The cardiomediastinal silhouette is within normal limits. No acute osseous abnormalities are seen.  IMPRESSION: No acute cardiopulmonary process seen.   Electronically Signed   By: Garald Balding M.D.   On: 10/15/2014 02:28    EKG:  Not done in ED, will get one.   Assessment/Plan Principal Problem:   GIB (gastrointestinal bleeding) Active Problems:   Hepatitis C   COPD (chronic obstructive pulmonary disease)   Heavy cigarette smoker   Alcohol abuse   AKI (acute kidney injury)   Pancytopenia  GIB (gastrointestinal bleeding): Likely upper  GI bleeding secondary to alcohol abuse, possible gastritis, alcoholic cirrhosis/esophageal varices. Patient's tachycardia, with elevated lactate 3.46, indicating hypoperfusion. Hemodynamically stable. GI was consulted today ED.  - will admit to tele bed - GI consulted by Ed, will follow up recommendations - NPO - IVF: 2.5 L NS and then 100 cc/h - 2U of blood ordered by ED - Start IV pantoprazole 40 mg bib - Octreotide load and infusion - prn Zofran IV for nausea and morphine for pain - Avoid NSAIDs and SQ heparin - Maintain IV access (2 large bore IVs if possible). - Monitor closely and follow q6h cbc, transfuse as necessary. - LaB: INR, PTT, Lactate - US-abd to evaluate for possible alcoholic cirrhosis - check UDS and HIV ab  Abnormal liver function: Likely secondary to hepatitis C and alcohol abuse - Hepatitis C VL - Hepatitis panel - avoid tylenol  Tobacco  abuse and Alcohol abuse: -Did counseling about importance of quitting smoking -Nicotine patch -Did counseling about the importance of quitting drinking -CIWA protocol  COPD (chronic obstructive pulmonary disease): stable. No shortness breath or coughing. -Albuterol Nebx prn  AKI: Likely due to prerenal secondary to GIB - IVF as above - Check FeNa - f/u US-abd to r/u hydronephrosis  DVT ppx: SCD  Code Status: Full code Family Communication: None at bed side.  Disposition Plan: Admit to inpatient   Date of Service 10/15/2014    Ivor Costa [facility] Pager [phone]  If 7PM-7AM, please contact night-coverage [email or web address] Password [facility] 10/15/2014, 3:48 AM

## 2014-10-15 NOTE — Care Management Note (Signed)
Case Management Note  Patient Details  Name: Chad Sparks MRN: [id number] Date of Birth: 06-Dec-1950  Subjective/Objective:      Adm w gi bleed              Action/Plan: lives at home   Expected Discharge Date:                  Expected Discharge Plan:  Home/Self Care  In-House Referral:     Discharge planning Services     Post Acute Care Choice:    Choice offered to:     DME Arranged:    DME Agency:     HH Arranged:    [place] Agency:     Status of Service:     Medicare Important Message Given:    Date Medicare IM Given:    Medicare IM give by:    Date Additional Medicare IM Given:    Additional Medicare Important Message give by:     If discussed at [place] of Stay Meetings, dates discussed:    Additional Comments: ur review done  Lacretia Leigh, RN 10/15/2014, 10:03 AM

## 2014-10-15 NOTE — Progress Notes (Signed)
PT Cancellation Note  Patient Details Name: Chad Sparks MRN: [id number] DOB: October 27, 1950   Cancelled Treatment:    Reason Eval/Treat Not Completed: Patient at procedure or test/unavailable (2 attempts to see pt this AM. Ist Dr.Joseph in room and now pt at procedure. Will attempt again as time allows)   Lanetta Inch [facility] 10/15/2014, 11:55 AM Elwyn Reach, [place]

## 2014-10-15 NOTE — ED Notes (Signed)
Pt vomiting.

## 2014-10-15 NOTE — Anesthesia Postprocedure Evaluation (Signed)
Anesthesia Post Note  Patient: Chad Sparks  Procedure(s) Performed: Procedure(s) (LRB): ESOPHAGOGASTRODUODENOSCOPY (EGD) WITH PROPOFOL (N/A)  Anesthesia type: MAC  Patient location: PACU  Post pain: Pain level controlled  Post assessment: Patient's Cardiovascular Status Stable  Last Vitals:  Filed Vitals:   10/15/14 1208  BP: 125/79  Pulse: 96  Temp: 36.9 C  Resp: 21    Post vital signs: Reviewed and stable  Level of consciousness: sedated  Complications: No apparent anesthesia complications

## 2014-10-15 NOTE — ED Notes (Signed)
Pt reports being daily Etoh

## 2014-10-15 NOTE — Anesthesia Preprocedure Evaluation (Signed)
Anesthesia Evaluation  Patient identified by MRN, date of birth, ID band Patient awake    Reviewed: Allergy & Precautions, NPO status , Patient's Chart, lab work & pertinent test results  Airway Mallampati: II  TM Distance: >3 FB Neck ROM: Full    Dental  (+) Edentulous Upper, Poor Dentition, Dental Advisory Given   Pulmonary COPDCurrent Smoker,    Pulmonary exam normal       Cardiovascular negative cardio ROS Normal cardiovascular exam    Neuro/Psych negative neurological ROS  negative psych ROS   GI/Hepatic (+)     substance abuse  alcohol use, Hepatitis -, C  Endo/Other  negative endocrine ROS  Renal/GU Renal InsufficiencyRenal disease     Musculoskeletal   Abdominal   Peds  Hematology   Anesthesia Other Findings   Reproductive/Obstetrics                             Anesthesia Physical Anesthesia Plan  ASA: III  Anesthesia Plan: MAC   Post-op Pain Management:    Induction: Intravenous  Airway Management Planned: Simple Face Mask  Additional Equipment:   Intra-op Plan:   Post-operative Plan:   Informed Consent: I have reviewed the patients History and Physical, chart, labs and discussed the procedure including the risks, benefits and alternatives for the proposed anesthesia with the patient or authorized representative who has indicated his/her understanding and acceptance.   Dental advisory given  Plan Discussed with: CRNA, Anesthesiologist and Surgeon  Anesthesia Plan Comments:         Anesthesia Quick Evaluation

## 2014-10-15 NOTE — Progress Notes (Signed)
Pt seen and examined, admitted this am pe Dr.Niu 63/M with COPD, HEp C and ETOH abuse here with Hematemesis and melena for 24hours IV PPI, Octreotide, 2units PRBC transfusion ongoing [facility] consulting, plan for EGD today Labs in am  Domenic Polite, MD [phone]

## 2014-10-15 NOTE — Op Note (Signed)
[facility] [place], [id number]   ENDOSCOPY PROCEDURE REPORT  PATIENT: Chad, Sparks  MR#: [id number] BIRTHDATE: [birth year] , 53  yrs. old GENDER: male ENDOSCOPIST: Acquanetta Sit, MD REFERRED BY: PROCEDURE DATE:  2014-11-02 PROCEDURE:  EGD ASA CLASS:     3 INDICATIONS:  melena and coffee-ground emesis MEDICATIONS: propofol per anesthesia TOPICAL ANESTHETIC:  DESCRIPTION OF PROCEDURE: After the risks benefits and alternatives of the procedure were thoroughly explained, informed consent was obtained.  The PENTAX GASTOROSCOPE [id number] endoscope was introduced through the mouth and advanced to the second portion of the duodenum , Without limitations.  The instrument was slowly withdrawn as the mucosa was fully examined. Estimated blood loss is zero unless otherwise noted in this procedure report.  Findings:  Esophagus: Normal. No evidence of Mallory-Weiss tear or esophageal varices.  Stomach: Normal, no blood seen and no stigmata of bleeding seen.  Duodenum: Normal      The scope was then withdrawn from the patient and the procedure completed.  COMPLICATIONS: There were no immediate complications.  ENDOSCOPIC IMPRESSION: normal EGD. There is nothing on this exam to explain coffee-ground emesis or melena. I suspect that there was some irritation from all the alcohol that he has been drinking.   RECOMMENDATIONS: monitor the patient clinically. PPI therapy. Avoid alcohol.  REPEAT EXAM:  eSignedAcquanetta Sit, MD 11/02/2014 11:55 AM    CC:  CPT CODES: ICD CODES:  The ICD and CPT codes recommended by this software are interpretations from the data that the clinical staff has captured with the software.  The verification of the translation of this report to the ICD and CPT codes and modifiers is the sole responsibility of the health care institution and practicing physician where this report was generated.  [employer]. will not be held responsible for the validity of the ICD and CPT codes included on this report.  AMA assumes no liability for data contained or not contained herein. CPT is a Designer, television/film set of the [employer].  PATIENT NAME:  Chad, Sparks MR#: [id number]

## 2014-10-15 NOTE — ED Notes (Signed)
Pt reports last Etoh today at 2pm.   Pt reports no food since Wednesday.

## 2014-10-16 ENCOUNTER — Inpatient Hospital Stay (HOSPITAL_COMMUNITY): Payer: Medicare Other

## 2014-10-16 ENCOUNTER — Encounter (HOSPITAL_COMMUNITY): Payer: Self-pay | Admitting: Gastroenterology

## 2014-10-16 DIAGNOSIS — N179 Acute kidney failure, unspecified: Secondary | ICD-10-CM

## 2014-10-16 LAB — COMPREHENSIVE METABOLIC PANEL
ALT: 48 U/L (ref 17–63)
AST: 85 U/L — AB (ref 15–41)
Albumin: 1.5 g/dL — ABNORMAL LOW (ref 3.5–5.0)
Alkaline Phosphatase: 84 U/L (ref 38–126)
Anion gap: 4 — ABNORMAL LOW (ref 5–15)
BILIRUBIN TOTAL: 2.5 mg/dL — AB (ref 0.3–1.2)
BUN: 7 mg/dL (ref 6–20)
CALCIUM: 7 mg/dL — AB (ref 8.9–10.3)
CO2: 19 mmol/L — ABNORMAL LOW (ref 22–32)
CREATININE: 1.51 mg/dL — AB (ref 0.61–1.24)
Chloride: 112 mmol/L — ABNORMAL HIGH (ref 101–111)
GFR calc Af Amer: 54 mL/min — ABNORMAL LOW (ref >60)
GFR, EST NON AFRICAN AMERICAN: 47 mL/min — AB (ref >60)
Glucose, Bld: 152 mg/dL — ABNORMAL HIGH (ref 65–99)
POTASSIUM: 3.8 mmol/L (ref 3.5–5.1)
Sodium: 135 mmol/L (ref 135–145)
TOTAL PROTEIN: 5.5 g/dL — AB (ref 6.5–8.1)

## 2014-10-16 LAB — CBC
HEMATOCRIT: 25.4 % — AB (ref 39.0–52.0)
HEMATOCRIT: 25.4 % — AB (ref 39.0–52.0)
Hemoglobin: 8.8 g/dL — ABNORMAL LOW (ref 13.0–17.0)
Hemoglobin: 8.6 g/dL — ABNORMAL LOW (ref 13.0–17.0)
MCH: 32 pg (ref 26.0–34.0)
MCH: 31.9 pg (ref 26.0–34.0)
MCHC: 34.6 g/dL (ref 30.0–36.0)
MCHC: 33.9 g/dL (ref 30.0–36.0)
MCV: 92.4 fL (ref 78.0–100.0)
MCV: 94.1 fL (ref 78.0–100.0)
PLATELETS: 32 10*3/uL — AB (ref 150–400)
Platelets: 30 10*3/uL — ABNORMAL LOW (ref 150–400)
RBC: 2.75 MIL/uL — ABNORMAL LOW (ref 4.22–5.81)
RBC: 2.7 MIL/uL — ABNORMAL LOW (ref 4.22–5.81)
RDW: 20.9 % — AB (ref 11.5–15.5)
RDW: 21 % — AB (ref 11.5–15.5)
WBC: 6.8 10*3/uL (ref 4.0–10.5)
WBC: 7.2 10*3/uL (ref 4.0–10.5)

## 2014-10-16 LAB — HEPATITIS PANEL, ACUTE
HCV Ab: >11 s/co ratio — ABNORMAL HIGH (ref 0.0–0.9)
HEP A IGM: NEGATIVE
Hep B C IgM: NEGATIVE
Hepatitis B Surface Ag: NEGATIVE

## 2014-10-16 LAB — TYPE AND SCREEN
ABO/RH(D): O POS
Antibody Screen: NEGATIVE
UNIT DIVISION: 0
Unit division: 0

## 2014-10-16 LAB — GLUCOSE, CAPILLARY: Glucose-Capillary: 124 mg/dL — ABNORMAL HIGH (ref 65–99)

## 2014-10-16 LAB — HIV ANTIBODY (ROUTINE TESTING W REFLEX): HIV Screen 4th Generation wRfx: NONREACTIVE

## 2014-10-16 MED ORDER — PNEUMOCOCCAL VAC POLYVALENT 25 MCG/0.5ML IJ INJ
0.5000 mL | INJECTION | INTRAMUSCULAR | Status: DC
  Filled 2014-10-16: qty 0.5

## 2014-10-16 MED ORDER — PEG 3350-KCL-NA BICARB-NACL 420 G PO SOLR
4000.0000 mL | Freq: Once | ORAL | Status: AC
  Administered 2014-10-16: 4000 mL via ORAL
  Filled 2014-10-16: qty 4000

## 2014-10-16 MED ORDER — PEG 3350-KCL-NA BICARB-NACL 420 G PO SOLR: 4000.0000 mL | Freq: Once | ORAL | Status: DC

## 2014-10-16 NOTE — Evaluation (Signed)
Physical Therapy Evaluation Patient Details Name: Chad Sparks MRN: [id number] DOB: November 20, 1950 Today's Date: 10/16/2014   History of Present Illness  64 y.o. male with PMH of hepatitis C, COPD, tobacco abuse, alcohol abuse, who presents with hematochezia and hematemesis.  Clinical Impression  Pt pleasant with rather flat affect who reports continued decline with several falls over the last year. Inability to shop for himself and unable to get out of the tub at home. Pt will benefit from acute therapy to maximize mobility, strength, gait, balance and function to decrease burden of care as well as ST-SNF rehab to maximize independence. Recommend daily mobility with nursing.     Follow Up Recommendations SNF;Supervision/Assistance - 24 hour    Equipment Recommendations  Rolling walker with 5" wheels;Other (comment) (tub bench)    Recommendations for Other Services       Precautions / Restrictions Precautions Precautions: Fall      Mobility  Bed Mobility Overal bed mobility: Modified Independent             General bed mobility comments: with rail and increased time  Transfers Overall transfer level: Needs assistance   Transfers: Sit to/from Stand Sit to Stand: Min assist         General transfer comment: cues for hand placement, anterior translation and assist for elevation from surface  Ambulation/Gait Ambulation/Gait assistance: Min guard Ambulation Distance (Feet): 50 Feet Assistive device: Rolling walker (2 wheeled) Gait Pattern/deviations: Step-through pattern;Decreased stride length;Trunk flexed   Gait velocity interpretation: Below normal speed for age/gender General Gait Details: cues for posture and position in RW with fatigue limiting distance  Stairs            Wheelchair Mobility    Modified Rankin (Stroke Patients Only)       Balance Overall balance assessment: Needs assistance;History of Falls   Sitting balance-Leahy Scale:  Fair       Standing balance-Leahy Scale: Poor                               Pertinent Vitals/Pain Pain Assessment: 0-10 Pain Score: 4  Pain Location: hands- arthritic Pain Descriptors / Indicators: Aching Pain Intervention(s): Repositioned    Home Living Family/patient expects to be discharged to:: Private residence Living Arrangements: Alone Available Help at Discharge: Family;Available PRN/intermittently Type of Home: Apartment Home Access: Level entry     Home Layout: One level Home Equipment: None      Prior Function Level of Independence: Needs assistance      ADL's / Homemaking Assistance Needed: doesn't drive, sisters do grocery shopping  Comments: pt reports continued decline over the last year and that he doesnt' leave his apartment much     Hand Dominance        Extremity/Trunk Assessment   Upper Extremity Assessment: Generalized weakness           Lower Extremity Assessment: RLE deficits/detail;LLE deficits/detail RLE Deficits / Details: hip flexion 4/5, abduct/ADD 3/5, knee extension and flexion 4/5 LLE Deficits / Details: hip flexion 4/5, abduct/ADD 3/5, knee extension and flexion 4/5  Cervical / Trunk Assessment: Kyphotic  Communication   Communication: No difficulties  Cognition Arousal/Alertness: Awake/alert Behavior During Therapy: WFL for tasks assessed/performed Overall Cognitive Status: Impaired/Different from baseline Area of Impairment: Safety/judgement         Safety/Judgement: Decreased awareness of deficits;Decreased awareness of safety     General Comments: pt incontinent of urine, aware and did not  notify anyone and returned to sleep    General Comments      Exercises        Assessment/Plan    PT Assessment Patient needs continued PT services  PT Diagnosis Difficulty walking;Generalized weakness   PT Problem List Decreased strength;Decreased cognition;Decreased activity tolerance;Decreased  balance;Decreased safety awareness;Decreased knowledge of use of DME;Decreased mobility  PT Treatment Interventions Gait training;DME instruction;Functional mobility training;Therapeutic activities;Therapeutic exercise;Balance training;Patient/family education;Cognitive remediation   PT Goals (Current goals can be found in the Care Plan section) Acute Rehab PT Goals Patient Stated Goal: return home PT Goal Formulation: With patient Time For Goal Achievement: 10/30/14 Potential to Achieve Goals: Fair    Frequency Min 3X/week   Barriers to discharge Decreased caregiver support      Co-evaluation               End of Session Equipment Utilized During Treatment: Gait belt Activity Tolerance: Patient tolerated treatment well Patient left: in chair;with call bell/phone within reach;with chair alarm set Nurse Communication: Mobility status         Time: 8337-4451 PT Time Calculation (min) (ACUTE ONLY): 20 min   Charges:   PT Evaluation $Initial PT Evaluation Tier I: 1 Procedure     PT G CodesMelford Aase 10/16/2014, 8:14 AM Elwyn Reach, [place]

## 2014-10-16 NOTE — Progress Notes (Signed)
[facility] Progress Note  Subjective: Other than feeling tired he does not have any specific complaints today. I reviewed his EGD with him again today. It was normal. There was nothing to explain coffee-ground emesis or melena. We talked about doing a colonoscopy. His last colonoscopy was a few years ago he states, and states it was normal, but we don't have any documentation of that.  Objective: Vital signs in last 24 hours: Temp:  [98.2 F (36.8 C)-99.6 F (37.6 C)] 98.8 F (37.1 C) (08/16 0739) Pulse Rate:  [73-128] 128 (08/16 0809) Resp:  [12-23] 17 (08/16 0739) BP: (117-152)/(71-98) 119/75 mmHg (08/16 0739) SpO2:  [96 %-100 %] 100 % (08/16 0809) Weight change:    PE:  No distress  Heart regular rhythm  Lungs clear  Abdomen soft and nontender  Lab Results: Results for orders placed or performed during the hospital encounter of 10/14/14 (from the past 24 hour(s))  Hepatitis panel, acute     Status: Abnormal   Collection Time: 10/15/14 12:10 PM  Result Value Ref Range   Hepatitis B Surface Ag Negative Negative   HCV Ab >11.0 (H) 0.0 - 0.9 s/co ratio   Hep A IgM Negative Negative   Hep B C IgM Negative Negative  Bilirubin, direct     Status: Abnormal   Collection Time: 10/15/14 12:10 PM  Result Value Ref Range   Bilirubin, Direct 1.6 (H) 0.1 - 0.5 mg/dL  HIV antibody     Status: None   Collection Time: 10/15/14 12:10 PM  Result Value Ref Range   HIV Screen 4th Generation wRfx Non Reactive Non Reactive  Comprehensive metabolic panel     Status: Abnormal   Collection Time: 10/15/14 12:10 PM  Result Value Ref Range   Sodium 142 135 - 145 mmol/L   Potassium 4.1 3.5 - 5.1 mmol/L   Chloride 116 (H) 101 - 111 mmol/L   CO2 17 (L) 22 - 32 mmol/L   Glucose, Bld 81 65 - 99 mg/dL   BUN 10 6 - 20 mg/dL   Creatinine, Ser 1.53 (H) 0.61 - 1.24 mg/dL   Calcium 7.3 (L) 8.9 - 10.3 mg/dL   Total Protein 5.6 (L) 6.5 - 8.1 g/dL   Albumin 1.6 (L) 3.5 - 5.0 g/dL   AST  115 (H) 15 - 41 U/L   ALT 58 17 - 63 U/L   Alkaline Phosphatase 92 38 - 126 U/L   Total Bilirubin 2.9 (H) 0.3 - 1.2 mg/dL   GFR calc non Af Amer 46 (L) >60 mL/min   GFR calc Af Amer 53 (L) >60 mL/min   Anion gap 9 5 - 15  Protime-INR     Status: Abnormal   Collection Time: 10/15/14 12:10 PM  Result Value Ref Range   Prothrombin Time 21.4 (H) 11.6 - 15.2 seconds   INR 1.87 (H) 0.00 - 1.49  APTT     Status: Abnormal   Collection Time: 10/15/14 12:10 PM  Result Value Ref Range   aPTT 45 (H) 24 - 37 seconds  CBC     Status: Abnormal   Collection Time: 10/15/14  4:15 PM  Result Value Ref Range   WBC 5.3 4.0 - 10.5 K/uL   RBC 3.09 (L) 4.22 - 5.81 MIL/uL   Hemoglobin 10.0 (L) 13.0 - 17.0 g/dL   HCT 28.7 (L) 39.0 - 52.0 %   MCV 92.9 78.0 - 100.0 fL   MCH 32.4 26.0 - 34.0 pg   MCHC 34.8 30.0 -  36.0 g/dL   RDW 21.2 (H) 11.5 - 15.5 %   Platelets 30 (L) 150 - 400 K/uL  CBC     Status: Abnormal   Collection Time: 10/15/14 11:20 PM  Result Value Ref Range   WBC 6.8 4.0 - 10.5 K/uL   RBC 2.75 (L) 4.22 - 5.81 MIL/uL   Hemoglobin 8.8 (L) 13.0 - 17.0 g/dL   HCT 25.4 (L) 39.0 - 52.0 %   MCV 92.4 78.0 - 100.0 fL   MCH 32.0 26.0 - 34.0 pg   MCHC 34.6 30.0 - 36.0 g/dL   RDW 20.9 (H) 11.5 - 15.5 %   Platelets 30 (L) 150 - 400 K/uL  Comprehensive metabolic panel     Status: Abnormal   Collection Time: 10/16/14  6:02 AM  Result Value Ref Range   Sodium 135 135 - 145 mmol/L   Potassium 3.8 3.5 - 5.1 mmol/L   Chloride 112 (H) 101 - 111 mmol/L   CO2 19 (L) 22 - 32 mmol/L   Glucose, Bld 152 (H) 65 - 99 mg/dL   BUN 7 6 - 20 mg/dL   Creatinine, Ser 1.51 (H) 0.61 - 1.24 mg/dL   Calcium 7.0 (L) 8.9 - 10.3 mg/dL   Total Protein 5.5 (L) 6.5 - 8.1 g/dL   Albumin 1.5 (L) 3.5 - 5.0 g/dL   AST 85 (H) 15 - 41 U/L   ALT 48 17 - 63 U/L   Alkaline Phosphatase 84 38 - 126 U/L   Total Bilirubin 2.5 (H) 0.3 - 1.2 mg/dL   GFR calc non Af Amer 47 (L) >60 mL/min   GFR calc Af Amer 54 (L) >60 mL/min   Anion  gap 4 (L) 5 - 15  CBC     Status: Abnormal   Collection Time: 10/16/14  6:57 AM  Result Value Ref Range   WBC 7.2 4.0 - 10.5 K/uL   RBC 2.70 (L) 4.22 - 5.81 MIL/uL   Hemoglobin 8.6 (L) 13.0 - 17.0 g/dL   HCT 25.4 (L) 39.0 - 52.0 %   MCV 94.1 78.0 - 100.0 fL   MCH 31.9 26.0 - 34.0 pg   MCHC 33.9 30.0 - 36.0 g/dL   RDW 21.0 (H) 11.5 - 15.5 %   Platelets 32 (L) 150 - 400 K/uL  Glucose, capillary     Status: Abnormal   Collection Time: 10/16/14  7:38 AM  Result Value Ref Range   Glucose-Capillary 124 (H) 65 - 99 mg/dL    Studies/Results: No results found.    Assessment: GI bleeding of uncertain etiology.  Anemia  Plan:   We will set up for colonoscopy tomorrow.    Chad Sparks 10/16/2014, 9:29 AM  Pager: [phone] If no answer or after 5 PM call [phone] Lab Results  Component Value Date   HGB 8.6* 10/16/2014   HGB 8.8* 10/15/2014   HGB 10.0* 10/15/2014   HCT 25.4* 10/16/2014   HCT 25.4* 10/15/2014   HCT 28.7* 10/15/2014   ALKPHOS 84 10/16/2014   ALKPHOS 92 10/15/2014   ALKPHOS 114 10/14/2014   AST 85* 10/16/2014   AST 115* 10/15/2014   AST 152* 10/14/2014   ALT 48 10/16/2014   ALT 58 10/15/2014   ALT 77* 10/14/2014

## 2014-10-16 NOTE — Progress Notes (Addendum)
[facility] PROGRESS NOTE  Chad Sparks IBB:[id number] DOB: 21-Mar-1950 DOA: 10/14/2014 PCP: No primary care provider on file.  Assessment/Plan: 1. Upper Gi Bleed -resolved, suspect ETOH related irritation -EGD normal -on PPI -GI following, plan for colonoscopy tomorrow -follow Hb  2. Acute blood loss anemia -due to 1, s/p 2 units PRBC 8/15am -follow Hb  3. ETOH abuse -continue thiamine, monitor on CIWA -no overt withdrawal yet  4. COPD -stable, albuterol PRN  5. 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 AKI -due to volume loss, s/p iVF and PRBC -IVf stopped, labs pending this am  7. H/o Hep C  DVt proph: SCDs  Code Status: Full Code Family Communication:none at bedside Disposition Plan: transfer to tele   Consultants:  [facility]  Procedures:  EGD: 8/15, normal  HPI/Subjective: Feels ok, no withdrawal symptoms  Objective: Filed Vitals:   10/16/14 1207  BP: 123/78  Pulse:   Temp:   Resp:     Intake/Output Summary (Last 24 hours) at 10/16/14 1250 Last data filed at 10/16/14 1000  Gross per 24 hour  Intake    866 ml  Output    525 ml  Net    341 ml   Filed Weights   10/15/14 0226  Weight: 71.5 kg (157 lb 10.1 oz)    Exam:   General:  AAOx3, no distress  Cardiovascular: S1S2/RRR  Respiratory: CTAB  Abdomen: soft, Nt, BS present  Musculoskeletal: no edema c/c  Neuro: mild tremors only, no asterixes   Data Reviewed: Basic Metabolic Panel:  Recent Labs Lab 10/14/14 2103 10/15/14 1210 10/16/14 0602  NA 138 142 135  K 3.7 4.1 3.8  CL 110 116* 112*  CO2 19* 17* 19*  GLUCOSE 125* 81 152*  BUN '12 10 7  '$ CREATININE 1.60* 1.53* 1.51*  CALCIUM 8.0* 7.3* 7.0*   Liver Function Tests:  Recent Labs Lab 10/14/14 2103 10/15/14 1210 10/16/14 0602  AST 152* 115* 85*  ALT 77* 58 48  ALKPHOS 114 92 84  BILITOT 2.5* 2.9* 2.5*  PROT 7.1 5.6* 5.5*   ALBUMIN 1.9* 1.6* 1.5*   No results for input(s): LIPASE, AMYLASE in the last 168 hours. No results for input(s): AMMONIA in the last 168 hours. CBC:  Recent Labs Lab 10/14/14 2103 10/15/14 1615 10/15/14 2320 10/16/14 0657  WBC 3.8* 5.3 6.8 7.2  NEUTROABS 1.7  --   --   --   HGB 7.8* 10.0* 8.8* 8.6*  HCT 23.0* 28.7* 25.4* 25.4*  MCV 99.6 92.9 92.4 94.1  PLT 40* 30* 30* 32*   Cardiac Enzymes: No results for input(s): CKTOTAL, CKMB, CKMBINDEX, TROPONINI in the last 168 hours. BNP (last 3 results) No results for input(s): BNP in the last 8760 hours.  ProBNP (last 3 results) No results for input(s): PROBNP in the last 8760 hours.  CBG:  Recent Labs Lab 10/16/14 0738  GLUCAP 124*    Recent Results (from the past 240 hour(s))  MRSA PCR Screening     Status: None   Collection Time: 10/15/14  3:13 AM  Result Value Ref Range Status   MRSA by PCR NEGATIVE NEGATIVE Final    Comment:        The GeneXpert MRSA Assay (FDA approved for NASAL specimens only), is one component of a comprehensive MRSA colonization surveillance program. It is not intended to diagnose MRSA infection nor to guide or monitor treatment for MRSA infections.  Studies: Dg Chest Portable 1 View  10/15/2014   CLINICAL DATA:  Pre-transfusion chest radiograph. Initial encounter.  EXAM: PORTABLE CHEST - 1 VIEW  COMPARISON:  None.  FINDINGS: The lungs are well-aerated and clear. There is no evidence of focal opacification, pleural effusion or pneumothorax.  The cardiomediastinal silhouette is within normal limits. No acute osseous abnormalities are seen.  IMPRESSION: No acute cardiopulmonary process seen.   Electronically Signed   By: Garald Balding M.D.   On: 10/15/2014 02:28    Scheduled Meds: . LORazepam  0-4 mg Intravenous 4 times per day   Followed by  . [START ON 10/17/2014] LORazepam  0-4 mg Intravenous Q12H  . nicotine  21 mg Transdermal Daily  . pantoprazole (PROTONIX) IV  40 mg  Intravenous Q12H  . polyethylene glycol-electrolytes  4,000 mL Oral Once  . sodium chloride  3 mL Intravenous Q12H  . thiamine  100 mg Oral Daily   Or  . thiamine  100 mg Intravenous Daily   Continuous Infusions:  Antibiotics Given (last 72 hours)    None      Principal Problem:   GIB (gastrointestinal bleeding) Active Problems:   Hepatitis C   COPD (chronic obstructive pulmonary disease)   Heavy cigarette smoker   Alcohol abuse   AKI (acute kidney injury)   Pancytopenia   Acute upper GI bleed    Time spent: 87mn    Chad Sparks  [facility] Pager [phone] If 7PM-7AM, please contact night-coverage at [email or web address], password [email or web address]8/16/2016, 12:50 PM  LOS: 1 day

## 2014-10-16 NOTE — Evaluation (Signed)
Occupational Therapy Evaluation Patient Details Name: Chad Sparks MRN: [id number] DOB: October 29, 1950 Today's Date: 10/16/2014    History of Present Illness 64 y.o. male with PMH of hepatitis C, COPD, tobacco abuse, alcohol abuse, who presents with hematochezia and hematemesis.   Clinical Impression   Pt was managing personal care and light meal prep and housekeeping prior to admission, but with a significant amount of falls including in the bathroom. Pt presents with weakness, impaired balance and decreased safety awareness.  Will benefit from [place] rehab in SNF prior to return home alone.  Will follow acutely.    Follow Up Recommendations  SNF    Equipment Recommendations  3 in 1 bedside comode;Tub/shower bench    Recommendations for Other Services       Precautions / Restrictions Precautions Precautions: Fall Restrictions Weight Bearing Restrictions: No      Mobility Bed Mobility Overal bed mobility: Modified Independent             General bed mobility comments: with rail and increased time  Transfers Overall transfer level: Needs assistance Equipment used: Rolling walker (2 wheeled) Transfers: Sit to/from Stand Sit to Stand: Min assist         General transfer comment: cues for hand placement, anterior translation and assist for elevation from surface    Balance Overall balance assessment: Needs assistance;History of Falls   Sitting balance-Leahy Scale: Fair       Standing balance-Leahy Scale: Poor                              ADL Overall ADL's : Needs assistance/impaired Eating/Feeding: NPO   Grooming: Oral care;Standing;Minimal assistance   Upper Body Bathing: Set up;Sitting   Lower Body Bathing: Minimal assistance;Sit to/from stand   Upper Body Dressing : Set up;Sitting   Lower Body Dressing: Sit to/from stand;Minimal assistance   Toilet Transfer: Min guard;Ambulation;RW;Regular Museum/gallery exhibitions officer  and Hygiene: Sit to/from stand;Minimal assistance       Functional mobility during ADLs: Herbalist     Praxis      Pertinent Vitals/Pain Pain Assessment: No/denies pain Pain Score: 4  Pain Location: hands- arthritic Pain Descriptors / Indicators: Aching Pain Intervention(s): Repositioned     Hand Dominance Right   Extremity/Trunk Assessment Upper Extremity Assessment Upper Extremity Assessment: Generalized weakness   Lower Extremity Assessment Lower Extremity Assessment: Defer to PT evaluation RLE Deficits / Details: hip flexion 4/5, abduct/ADD 3/5, knee extension and flexion 4/5 LLE Deficits / Details: hip flexion 4/5, abduct/ADD 3/5, knee extension and flexion 4/5   Cervical / Trunk Assessment Cervical / Trunk Assessment: Kyphotic   Communication Communication Communication: No difficulties   Cognition Arousal/Alertness: Awake/alert Behavior During Therapy: WFL for tasks assessed/performed Overall Cognitive Status: Impaired/Different from baseline Area of Impairment: Safety/judgement         Safety/Judgement: Decreased awareness of deficits;Decreased awareness of safety     General Comments: pt incontinent of urine, aware and did not notify anyone and returned to sleep   General Comments       Exercises       Shoulder Instructions      Home Living Family/patient expects to be discharged to:: Private residence Living Arrangements: Alone Available Help at Discharge: Family;Available PRN/intermittently Type of Home: Apartment Home Access: Level entry     Home Layout: One level  Bathroom Shower/Tub: Risk analyst characteristics: Scientist, water quality: None          Prior Functioning/Environment Level of Independence: Needs assistance    ADL's / Homemaking Assistance Needed: doesn't drive, sisters do grocery shopping   Comments: pt reports  continued decline over the last year and that he doesnt' leave his apartment much    OT Diagnosis: Generalized weakness;Cognitive deficits   OT Problem List: Decreased strength;Decreased activity tolerance;Impaired balance (sitting and/or standing);Decreased knowledge of use of DME or AE;Decreased safety awareness   OT Treatment/Interventions: Self-care/ADL training;DME and/or AE instruction;Therapeutic activities;Patient/family education    OT Goals(Current goals can be found in the care plan section) Acute Rehab OT Goals Patient Stated Goal: return home OT Goal Formulation: With patient Time For Goal Achievement: 10/30/14 Potential to Achieve Goals: Good ADL Goals Pt Will Perform Grooming: with modified independence;standing Pt Will Perform Lower Body Bathing: with modified independence;sit to/from stand Pt Will Perform Lower Body Dressing: with modified independence;sit to/from stand Pt Will Transfer to Toilet: with modified independence;ambulating;bedside commode (over toilet) Pt Will Perform Toileting - Clothing Manipulation and hygiene: with modified independence;sit to/from stand Pt Will Perform Tub/Shower Transfer: Tub transfer;with modified independence;ambulating;tub bench;rolling walker  OT Frequency: Min 2X/week   Barriers to D/C: Decreased caregiver support          Co-evaluation              End of Session Nurse Communication:  (pt is NPO for test)  Activity Tolerance: Patient tolerated treatment well Patient left: in chair;with call bell/phone within reach;with family/visitor present;with chair alarm set   Time: 8841-6606 OT Time Calculation (min): 18 min Charges:  OT General Charges $OT Visit: 1 Procedure OT Evaluation $Initial OT Evaluation Tier I: 1 Procedure G-Codes:    Malka So 10/16/2014, 9:06 AM

## 2014-10-17 ENCOUNTER — Encounter (HOSPITAL_COMMUNITY)
Admission: EM | Disposition: A | Discharge status: Skilled Nursing Facility | Payer: Self-pay | Source: Home / Self Care | Attending: Internal Medicine

## 2014-10-17 ENCOUNTER — Encounter (HOSPITAL_COMMUNITY): Payer: Self-pay | Admitting: Anesthesiology

## 2014-10-17 DIAGNOSIS — D61818 Other pancytopenia: Secondary | ICD-10-CM

## 2014-10-17 LAB — GLUCOSE, CAPILLARY: Glucose-Capillary: 154 mg/dL — ABNORMAL HIGH (ref 65–99)

## 2014-10-17 LAB — COMPREHENSIVE METABOLIC PANEL
ALBUMIN: 1.5 g/dL — AB (ref 3.5–5.0)
ALT: 43 U/L (ref 17–63)
ANION GAP: 5 (ref 5–15)
AST: 74 U/L — AB (ref 15–41)
Alkaline Phosphatase: 81 U/L (ref 38–126)
BILIRUBIN TOTAL: 3.2 mg/dL — AB (ref 0.3–1.2)
BUN: 8 mg/dL (ref 6–20)
CHLORIDE: 110 mmol/L (ref 101–111)
CO2: 18 mmol/L — AB (ref 22–32)
Calcium: 7 mg/dL — ABNORMAL LOW (ref 8.9–10.3)
Creatinine, Ser: 1.48 mg/dL — ABNORMAL HIGH (ref 0.61–1.24)
GFR calc Af Amer: 56 mL/min — ABNORMAL LOW (ref >60)
GFR calc non Af Amer: 48 mL/min — ABNORMAL LOW (ref >60)
GLUCOSE: 106 mg/dL — AB (ref 65–99)
POTASSIUM: 3.8 mmol/L (ref 3.5–5.1)
SODIUM: 133 mmol/L — AB (ref 135–145)
TOTAL PROTEIN: 5.7 g/dL — AB (ref 6.5–8.1)

## 2014-10-17 LAB — CBC
HEMATOCRIT: 25.2 % — AB (ref 39.0–52.0)
HEMOGLOBIN: 8.5 g/dL — AB (ref 13.0–17.0)
MCH: 32.1 pg (ref 26.0–34.0)
MCHC: 33.7 g/dL (ref 30.0–36.0)
MCV: 95.1 fL (ref 78.0–100.0)
Platelets: 32 10*3/uL — ABNORMAL LOW (ref 150–400)
RBC: 2.65 MIL/uL — ABNORMAL LOW (ref 4.22–5.81)
RDW: 20.6 % — AB (ref 11.5–15.5)
WBC: 6 10*3/uL (ref 4.0–10.5)

## 2014-10-17 LAB — HCV RNA QUANT
HCV QUANT LOG: 6.25 {Log_IU}/mL (ref >1.70)
HCV Quantitative: 1780000 IU/mL (ref >50)

## 2014-10-17 SURGERY — CANCELLED PROCEDURE

## 2014-10-17 MED ORDER — PHYTONADIONE 5 MG PO TABS
5.0000 mg | ORAL_TABLET | Freq: Once | ORAL | Status: AC
  Administered 2014-10-17: 5 mg via ORAL
  Filled 2014-10-17: qty 1

## 2014-10-17 NOTE — Progress Notes (Signed)
RN attempting to have patient drink colonoscopy prep since beginning of shift. Only one cup of golytely drank thus far. Patient now refusing to drink prep, stating 'Can't they just give me something else.' Thoroughly explained to the patient the importance of drinking prep for procedure in the morning. Patient still refusing to drink golytely. Dr. Penelope Coop notified and aware. Colonoscopy will be canceled at this time.  M.Forest Gleason, RN

## 2014-10-17 NOTE — Progress Notes (Signed)
The patient refused to drink his prep for colonoscopy. I talked to him today. He refuses to drink it and refuses colonoscopy. We will sign off. I will give him a regular diet.

## 2014-10-17 NOTE — Care Management Important Message (Signed)
Important Message  Patient Details  Name: DAYLAN JUHNKE MRN: [id number] Date of Birth: 1950-08-03   Medicare Important Message Given:  Yes-second notification given    Pricilla Handler 10/17/2014, 11:50 AM

## 2014-10-17 NOTE — Progress Notes (Signed)
[facility] PROGRESS NOTE  Assessment/Plan: GIB (gastrointestinal bleeding)/acute blood loss anemia: - No sign of bleeding, EGD on 8.16.2016 no signs of bleeding. - colonoscopy on 8.17.2016. Cont PPI. - HBg has stabilize. S/p 2 units of PRBC  AKI (acute kidney injury) - Unknown baseline creatinine, his renal function continues to improve with IV hydration.  Pancytopenia: - Likely due to alcohol abuse. - Abdominal ultrasound shows signs of cirrhosis.  Cirrhosis due to alcohol abuse and  Hepatitis C - Ultrasound confirmed discuss with him abstinence. - PT and INR was 1.8, give vitamin K check a PT and INR.  Heavy cigarette smoker - counseling.  COPD (chronic obstructive pulmonary disease)  Alcohol abuse: - No signs of withdrawal.   Code Status: full Family Communication: none  Disposition Plan: home in am   Consultants:  GI  Procedures:  EGD on 8.16.2016: No signs of bleeding.  Antibiotics:  none  HPI/Subjective: He has no new complaints, he hasn't had any further bleedings. No melanotic or red stools.  Objective: Filed Vitals:   10/16/14 2320 10/17/14 0000 10/17/14 0200 10/17/14 0400  BP: 138/74 129/83 131/80 124/77  Pulse: 75 65 64 81  Temp: 98.4 F (36.9 C)   99.3 F (37.4 C)  TempSrc: Oral   Oral  Resp: '20 14 13 15  '$ Height:      Weight:      SpO2: 98% 100% 100% 99%    Intake/Output Summary (Last 24 hours) at 10/17/14 0719 Last data filed at 10/17/14 0400  Gross per 24 hour  Intake    551 ml  Output    500 ml  Net     51 ml   Filed Weights   10/15/14 0226  Weight: 71.5 kg (157 lb 10.1 oz)    Exam:  General: Alert, awake, oriented x3, in no acute distress.  HEENT: No bruits, no goiter.  Heart: Regular rate and rhythm. Lungs: Good air movement, clear Abdomen: Soft, nontender, nondistended, positive bowel sounds.  Neuro: Grossly intact, nonfocal.   Data Reviewed: Basic Metabolic Panel:  Recent Labs Lab 10/14/14 2103  10/15/14 1210 10/16/14 0602 10/17/14 0418  NA 138 142 135 133*  K 3.7 4.1 3.8 3.8  CL 110 116* 112* 110  CO2 19* 17* 19* 18*  GLUCOSE 125* 81 152* 106*  BUN '12 10 7 8  '$ CREATININE 1.60* 1.53* 1.51* 1.48*  CALCIUM 8.0* 7.3* 7.0* 7.0*   Liver Function Tests:  Recent Labs Lab 10/14/14 2103 10/15/14 1210 10/16/14 0602 10/17/14 0418  AST 152* 115* 85* 74*  ALT 77* 58 48 43  ALKPHOS 114 92 84 81  BILITOT 2.5* 2.9* 2.5* 3.2*  PROT 7.1 5.6* 5.5* 5.7*  ALBUMIN 1.9* 1.6* 1.5* 1.5*   No results for input(s): LIPASE, AMYLASE in the last 168 hours. No results for input(s): AMMONIA in the last 168 hours. CBC:  Recent Labs Lab 10/14/14 2103 10/15/14 1615 10/15/14 2320 10/16/14 0657 10/17/14 0418  WBC 3.8* 5.3 6.8 7.2 6.0  NEUTROABS 1.7  --   --   --   --   HGB 7.8* 10.0* 8.8* 8.6* 8.5*  HCT 23.0* 28.7* 25.4* 25.4* 25.2*  MCV 99.6 92.9 92.4 94.1 95.1  PLT 40* 30* 30* 32* 32*   Cardiac Enzymes: No results for input(s): CKTOTAL, CKMB, CKMBINDEX, TROPONINI in the last 168 hours. BNP (last 3 results) No results for input(s): BNP in the last 8760 hours.  ProBNP (last 3 results) No results for input(s): PROBNP in the last 8760  hours.  CBG:  Recent Labs Lab 10/16/14 0738  GLUCAP 124*    Recent Results (from the past 240 hour(s))  MRSA PCR Screening     Status: None   Collection Time: 10/15/14  3:13 AM  Result Value Ref Range Status   MRSA by PCR NEGATIVE NEGATIVE Final    Comment:        The GeneXpert MRSA Assay (FDA approved for NASAL specimens only), is one component of a comprehensive MRSA colonization surveillance program. It is not intended to diagnose MRSA infection nor to guide or monitor treatment for MRSA infections.      Studies: US Abdomen Complete  10/16/2014   CLINICAL DATA:  GI bleeding.  Chronic hepatitis-C and alcohol abuse.  EXAM: ULTRASOUND ABDOMEN COMPLETE  COMPARISON:  None.  FINDINGS: Gallbladder: Echogenic sludge noted within the  gallbladder, however no definite gallstones identified. Scattered tiny 1-2 mm nodular densities are seen along the gallbladder wall, suspicious for cholesterolosis which is of no clinical significance. No sonographic Murphy sign noted by sonographer.  Common bile duct: Diameter: 3 mm, within normal limits  Liver: Increased parenchymal echogenicity and capsular nodularity, consistent with cirrhosis. Mild perihepatic ascites also noted. No liver mass visualized.  IVC: No abnormality visualized.  Pancreas: Visualized portion unremarkable.  Spleen: Size and appearance within normal limits.  Right Kidney: Length: 10.0 cm. Echogenicity within normal limits. Tiny 1 cm subcapsular cyst noted in upper pole. No mass or hydronephrosis visualized.  Left Kidney: Length: 9.6 cm. Echogenicity within normal limits. Small cysts noted, largest measuring 2.1 cm. No mass or hydronephrosis visualized.  Abdominal aorta: No aneurysm visualized.  Other findings: None.  IMPRESSION: Gallbladder sludge, without definite gallstones or biliary ductal dilatation.  Hepatic cirrhosis and mild perihepatic ascites. No liver mass visualized.  Small bilateral renal cysts.  No evidence of hydronephrosis.   Electronically Signed   By: Earle Gell M.D.   On: 10/16/2014 13:48    Scheduled Meds: . LORazepam  0-4 mg Intravenous Q12H  . nicotine  21 mg Transdermal Daily  . pantoprazole (PROTONIX) IV  40 mg Intravenous Q12H  . pneumococcal 23 valent vaccine  0.5 mL Intramuscular Tomorrow-1000  . sodium chloride  3 mL Intravenous Q12H  . thiamine  100 mg Oral Daily   Or  . thiamine  100 mg Intravenous Daily   Continuous Infusions:   Time Spent: 25 min   Charlynne Cousins  [facility] Pager [phone]. If 7PM-7AM, please contact night-coverage at [email or web address], password [email or web address] [facility] 10/17/2014, 7:19 AM  LOS: 2 days

## 2014-10-17 NOTE — Clinical Social Work Note (Signed)
Clinical Social Work Assessment  Patient Details  Name: Chad Sparks MRN: [id number] Date of Birth: 12-29-1950  Date of referral:  10/17/14               Reason for consult:  Facility Placement                Permission sought to share information with:  Chartered certified accountant granted to share information::  Yes, Verbal Permission Granted  Name::        Agency::  [facility]  Relationship::     Contact Information:     Housing/Transportation Living arrangements for the past 2 months:  Single Family Home Source of Information:  Patient Patient Interpreter Needed:  None Criminal Activity/Legal Involvement Pertinent to Current Situation/Hospitalization:  No - Comment as needed Significant Relationships:  Siblings Lives with:  Self Do you feel safe going back to the place where you live?  Yes Need for family participation in patient care:  No (Coment)  Care giving concerns: pt lives alone   Facilities manager / plan:  CSW spoke with pt about PT recommendation for SNF  Employment status:    Insurance information:  Medicare PT Recommendations:  [place] / Referral to community resources:  [place]  Patient/Family's Response to care:  Pt agreeable to SNF placement for very short term rehab- does not wish to stay more than 2 weeks  Patient/Family's Understanding of and Emotional Response to Diagnosis, Current Treatment, and Prognosis:  Pt did not have any questions or concerns at this time  Emotional Assessment Appearance:  Appears stated age Attitude/Demeanor/Rapport:    Affect (typically observed):  Appropriate Orientation:  Oriented to Self, Oriented to Place, Oriented to  Time, Oriented to Situation Alcohol / Substance use:  Alcohol Use Psych involvement (Current and /or in the community):  No (Comment)  Discharge Needs  Concerns to be addressed:  Care Coordination Readmission within the  last 30 days:  No Current discharge risk:  Physical Impairment Barriers to Discharge:  Continued Medical Work up   [employer], LCSW 10/17/2014, 3:53 PM

## 2014-10-17 NOTE — Clinical Social Work Placement (Signed)
   CLINICAL SOCIAL WORK PLACEMENT  NOTE  Date:  10/17/2014  Patient Details  Name: Chad Sparks MRN: [id number] Date of Birth: 04-25-1950  Clinical Social Work is seeking post-discharge placement for this patient at the [place] level of care (*CSW will initial, date and re-position this form in  chart as items are completed):  Yes   Patient/family provided with [place] Work Department's list of facilities offering this level of care within the geographic area requested by the patient (or if unable, by the patient's family).  Yes   Patient/family informed of their freedom to choose among providers that offer the needed level of care, that participate in Medicare, Medicaid or managed care program needed by the patient, have an available bed and are willing to accept the patient.  Yes   Patient/family informed of [place]'s ownership interest in [facility] and [facility], as well as of the fact that they are under no obligation to receive care at these facilities.  PASRR submitted to EDS on 10/17/14     PASRR number received on 10/17/14     Existing PASRR number confirmed on       FL2 transmitted to all facilities in geographic area requested by pt/family on 10/17/14     FL2 transmitted to all facilities within larger geographic area on       Patient informed that his/her managed care company has contracts with or will negotiate with certain facilities, including the following:            Patient/family informed of bed offers received.  Patient chooses bed at       Physician recommends and patient chooses bed at      Patient to be transferred to   on  .  Patient to be transferred to facility by       Patient family notified on   of transfer.  Name of family member notified:        PHYSICIAN Please sign FL2     Additional Comment:    _______________________________________________ Cranford Mon, LCSW 10/17/2014,  3:54 PM

## 2014-10-17 NOTE — Progress Notes (Addendum)
Pt given bed offers- choice pending  csw will continue to follow  Domenica Reamer, [place] Social Worker [phone]

## 2014-10-18 LAB — PROTIME-INR
INR: 1.93 — ABNORMAL HIGH (ref 0.00–1.49)
INR: 2.07 — AB (ref 0.00–1.49)
PROTHROMBIN TIME: 22 s — AB (ref 11.6–15.2)
Prothrombin Time: 23.2 seconds — ABNORMAL HIGH (ref 11.6–15.2)

## 2014-10-18 LAB — GLUCOSE, CAPILLARY: GLUCOSE-CAPILLARY: 83 mg/dL (ref 65–99)

## 2014-10-18 MED ORDER — PHYTONADIONE 5 MG PO TABS
5.0000 mg | ORAL_TABLET | Freq: Once | ORAL | Status: AC
  Administered 2014-10-18: 5 mg via ORAL
  Filled 2014-10-18: qty 1

## 2014-10-18 MED ORDER — DEXTROSE 5 % IV SOLN
INTRAVENOUS | Status: AC
  Administered 2014-10-18: 17:00:00 via INTRAVENOUS
  Filled 2014-10-18: qty 1000

## 2014-10-18 NOTE — Progress Notes (Signed)
[facility] PROGRESS NOTE  Assessment/Plan: GIB (gastrointestinal bleeding)/acute blood loss anemia: - No sign of bleeding, EGD on 8.16.2016 showed no signs of bleeding. - he refused colonoscopy,Cont PPI. - HBg has stabilize. S/p 2 units of PRBC. - check Hbg in am is stable home. - advance diet  AKI (acute kidney injury) - renal function improving, change IV to K7Q  Metabolic acidosis: - Probably due to renal disease. - Start D5w with HCO3.  Pancytopenia: - Likely due to alcohol abuse. - Abdominal ultrasound shows signs of cirrhosis.  Cirrhosis due to alcohol abuse and  Hepatitis C - Ultrasound confirmed discuss with him abstinence. - Check a PT and INR.  Heavy cigarette smoker - counseling.  COPD (chronic obstructive pulmonary disease)  Alcohol abuse: - No signs of withdrawal.   Code Status: full Family Communication: none  Disposition Plan: home in am   Consultants:  GI  Procedures:  EGD on 8.16.2016: No signs of bleeding.  Antibiotics:  none  HPI/Subjective: He has no new complaints, he hasn't had any further bleedings. No melanotic or red stools.  Objective: Filed Vitals:   10/17/14 1213 10/17/14 1353 10/17/14 2107 10/18/14 0532  BP: 109/57 101/61 115/62 109/64  Pulse: 68 111 79 94  Temp: 98.9 F (37.2 C) 99.4 F (37.4 C) 99.9 F (37.7 C) 100.3 F (37.9 C)  TempSrc: Oral Oral Oral Oral  Resp: '17 18 16 18  '$ Height:      Weight:      SpO2: 100% 97% 100% 96%    Intake/Output Summary (Last 24 hours) at 10/18/14 1322 Last data filed at 10/18/14 0941  Gross per 24 hour  Intake    240 ml  Output    250 ml  Net    -10 ml   Filed Weights   10/15/14 0226  Weight: 71.5 kg (157 lb 10.1 oz)    Exam:  General: Alert, awake, oriented x3, in no acute distress.  HEENT: No bruits, no goiter.  Heart: Regular rate and rhythm. Lungs: Good air movement, clear Abdomen: Soft, nontender, nondistended, positive bowel sounds.  Neuro:  Grossly intact, nonfocal.   Data Reviewed: Basic Metabolic Panel:  Recent Labs Lab 10/14/14 2103 10/15/14 1210 10/16/14 0602 10/17/14 0418  NA 138 142 135 133*  K 3.7 4.1 3.8 3.8  CL 110 116* 112* 110  CO2 19* 17* 19* 18*  GLUCOSE 125* 81 152* 106*  BUN '12 10 7 8  '$ CREATININE 1.60* 1.53* 1.51* 1.48*  CALCIUM 8.0* 7.3* 7.0* 7.0*   Liver Function Tests:  Recent Labs Lab 10/14/14 2103 10/15/14 1210 10/16/14 0602 10/17/14 0418  AST 152* 115* 85* 74*  ALT 77* 58 48 43  ALKPHOS 114 92 84 81  BILITOT 2.5* 2.9* 2.5* 3.2*  PROT 7.1 5.6* 5.5* 5.7*  ALBUMIN 1.9* 1.6* 1.5* 1.5*   No results for input(s): LIPASE, AMYLASE in the last 168 hours. No results for input(s): AMMONIA in the last 168 hours. CBC:  Recent Labs Lab 10/14/14 2103 10/15/14 1615 10/15/14 2320 10/16/14 0657 10/17/14 0418  WBC 3.8* 5.3 6.8 7.2 6.0  NEUTROABS 1.7  --   --   --   --   HGB 7.8* 10.0* 8.8* 8.6* 8.5*  HCT 23.0* 28.7* 25.4* 25.4* 25.2*  MCV 99.6 92.9 92.4 94.1 95.1  PLT 40* 30* 30* 32* 32*   Cardiac Enzymes: No results for input(s): CKTOTAL, CKMB, CKMBINDEX, TROPONINI in the last 168 hours. BNP (last 3 results) No results for input(s): BNP in the  last 8760 hours.  ProBNP (last 3 results) No results for input(s): PROBNP in the last 8760 hours.  CBG:  Recent Labs Lab 10/16/14 0738 10/17/14 0903 10/18/14 0755  GLUCAP 124* 154* 83    Recent Results (from the past 240 hour(s))  MRSA PCR Screening     Status: None   Collection Time: 10/15/14  3:13 AM  Result Value Ref Range Status   MRSA by PCR NEGATIVE NEGATIVE Final    Comment:        The GeneXpert MRSA Assay (FDA approved for NASAL specimens only), is one component of a comprehensive MRSA colonization surveillance program. It is not intended to diagnose MRSA infection nor to guide or monitor treatment for MRSA infections.      Studies: US Abdomen Complete  10/16/2014   CLINICAL DATA:  GI bleeding.  Chronic  hepatitis-C and alcohol abuse.  EXAM: ULTRASOUND ABDOMEN COMPLETE  COMPARISON:  None.  FINDINGS: Gallbladder: Echogenic sludge noted within the gallbladder, however no definite gallstones identified. Scattered tiny 1-2 mm nodular densities are seen along the gallbladder wall, suspicious for cholesterolosis which is of no clinical significance. No sonographic Murphy sign noted by sonographer.  Common bile duct: Diameter: 3 mm, within normal limits  Liver: Increased parenchymal echogenicity and capsular nodularity, consistent with cirrhosis. Mild perihepatic ascites also noted. No liver mass visualized.  IVC: No abnormality visualized.  Pancreas: Visualized portion unremarkable.  Spleen: Size and appearance within normal limits.  Right Kidney: Length: 10.0 cm. Echogenicity within normal limits. Tiny 1 cm subcapsular cyst noted in upper pole. No mass or hydronephrosis visualized.  Left Kidney: Length: 9.6 cm. Echogenicity within normal limits. Small cysts noted, largest measuring 2.1 cm. No mass or hydronephrosis visualized.  Abdominal aorta: No aneurysm visualized.  Other findings: None.  IMPRESSION: Gallbladder sludge, without definite gallstones or biliary ductal dilatation.  Hepatic cirrhosis and mild perihepatic ascites. No liver mass visualized.  Small bilateral renal cysts.  No evidence of hydronephrosis.   Electronically Signed   By: Earle Gell M.D.   On: 10/16/2014 13:48    Scheduled Meds: . LORazepam  0-4 mg Intravenous Q12H  . nicotine  21 mg Transdermal Daily  . pantoprazole (PROTONIX) IV  40 mg Intravenous Q12H  . phytonadione  5 mg Oral Once  . pneumococcal 23 valent vaccine  0.5 mL Intramuscular Tomorrow-1000  . sodium chloride  3 mL Intravenous Q12H  . thiamine  100 mg Oral Daily   Or  . thiamine  100 mg Intravenous Daily   Continuous Infusions:   Time Spent: 25 min   Charlynne Cousins  [facility] Pager [phone]. If 7PM-7AM, please contact night-coverage at  [email or web address], password [facility] 10/18/2014, 1:22 PM  LOS: 3 days

## 2014-10-18 NOTE — Progress Notes (Signed)
PT Cancellation Note  Patient Details Name: Chad Sparks MRN: [id number] DOB: 1950-09-30   Cancelled Treatment:    Reason Eval/Treat Not Completed: Patient declined, no reason specified.  Is running a fever today and not feeling completely energetic but has been up to BR without assistance.   Ramond Dial 10/18/2014, 12:10 PM   Mee Hives, PT MS Acute Rehab Dept. Number: [facility] [id number] and [place] [phone]

## 2014-10-18 NOTE — Progress Notes (Signed)
Utilization Review completed. Jarrah Seher RN BSN CM 

## 2014-10-18 NOTE — Clinical Social Work Note (Signed)
Clinical Social Worker continuing to follow patient and family for support and discharge planning needs.  CSW spoke with patient at bedside who states that he has chosen a bed [name].  CSW spoke with admissions coordinator at the facility who states that bed will be available tomorrow.  Patient plans to provide update to his family.  CSW remains available to facilitate patient discharge needs once medically stable.  Chad Sparks, [place]

## 2014-10-19 LAB — GLUCOSE, CAPILLARY: GLUCOSE-CAPILLARY: 85 mg/dL (ref 65–99)

## 2014-10-19 LAB — BASIC METABOLIC PANEL
Anion gap: 7 (ref 5–15)
BUN: 6 mg/dL (ref 6–20)
CALCIUM: 7.5 mg/dL — AB (ref 8.9–10.3)
CO2: 23 mmol/L (ref 22–32)
CREATININE: 1.53 mg/dL — AB (ref 0.61–1.24)
Chloride: 105 mmol/L (ref 101–111)
GFR, EST AFRICAN AMERICAN: 53 mL/min — AB (ref >60)
GFR, EST NON AFRICAN AMERICAN: 46 mL/min — AB (ref >60)
Glucose, Bld: 97 mg/dL (ref 65–99)
Potassium: 3.5 mmol/L (ref 3.5–5.1)
SODIUM: 135 mmol/L (ref 135–145)

## 2014-10-19 LAB — CBC
HCT: 24.7 % — ABNORMAL LOW (ref 39.0–52.0)
Hemoglobin: 8.4 g/dL — ABNORMAL LOW (ref 13.0–17.0)
MCH: 32.3 pg (ref 26.0–34.0)
MCHC: 34 g/dL (ref 30.0–36.0)
MCV: 95 fL (ref 78.0–100.0)
PLATELETS: 42 10*3/uL — AB (ref 150–400)
RBC: 2.6 MIL/uL — AB (ref 4.22–5.81)
RDW: 20.4 % — AB (ref 11.5–15.5)
WBC: 6.2 10*3/uL (ref 4.0–10.5)

## 2014-10-19 MED ORDER — PANTOPRAZOLE SODIUM 40 MG PO TBEC: 40.0000 mg | DELAYED_RELEASE_TABLET | Freq: Every day | ORAL | Status: DC

## 2014-10-19 NOTE — Progress Notes (Signed)
Report given to [facility].  Will continue to monitor.

## 2014-10-19 NOTE — Discharge Summary (Signed)
Physician Discharge Summary  Chad Sparks OIN:[id number] DOB: 1950/04/14 DOA: 10/14/2014  PCP: No primary care provider on file.  Admit date: 10/14/2014 Discharge date: 10/19/2014  Time spent: 35 minutes  Recommendations for Outpatient Follow-up:  1. Follow-up with primary care doctor as an outpatient. Patient has refuse colonoscopy.  Discharge Diagnoses:  Principal Problem:   GIB (gastrointestinal bleeding) Active Problems:   Hepatitis C   COPD (chronic obstructive pulmonary disease)   Heavy cigarette smoker   Alcohol abuse   AKI (acute kidney injury)   Pancytopenia   Acute upper GI bleed   Discharge Condition: stable  Diet recommendation: regular  Filed Weights   10/15/14 0226  Weight: 71.5 kg (157 lb 10.1 oz)    History of present illness:  64 year old male with past medical history of hepatitis C and COPD reports 3 episodes of black stools in the last 24 hours prior to admission. Patient admits to having alcohol. He relates his last colonoscopy was more than 2 years ago. He is not currently on any anticoagulation NSAID or aspirin.  Hospital Course:  Acute blood loss anemia/gastrointestinal bleeding: He was started empirically on Protonix twice a day, GI was consulted who recommended an EGD which was done in a 16 2016 that showed no signs of bleeding. He was transfused 2 units of packed red blood cells and his hemoglobin remained stable. Patient refused colonoscopy as he was not able to tolerate the prep. 2 GI recommended follow-up as an outpatient.  Chronic kidney disease stage III: Creatinine remained stable from 1. 412.6.  Metabolic acidosis likely due to chronic renal disease: After IV fluid hydration resolved.  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  Heavy tobacco  abuse: Counseling.  Alcohol abuse No signs of withdrawal.   Procedures:  Upper endoscopy  Chest x-ray  Abdominal ultrasound  Consultations:  GI  Discharge Exam: Filed Vitals:   10/19/14 0514  BP: 132/81  Pulse: 84  Temp: 99.8 F (37.7 C)  Resp: 18    General: A&O x3 Cardiovascular: RRR Respiratory: good air movement CTA B/L  Discharge Instructions   Discharge Instructions    Diet - low sodium heart healthy    Complete by:  As directed      Increase activity slowly    Complete by:  As directed           Current Discharge Medication List    START taking these medications   Details  pantoprazole (PROTONIX) 40 MG tablet Take 1 tablet (40 mg total) by mouth daily. Qty: 30 tablet, Refills: 0       No Known Allergies    The results of significant diagnostics from this hospitalization (including imaging, microbiology, ancillary and laboratory) are listed below for reference.    Significant Diagnostic Studies: US Abdomen Complete  10/16/2014   CLINICAL DATA:  GI bleeding.  Chronic hepatitis-C and alcohol abuse.  EXAM: ULTRASOUND ABDOMEN COMPLETE  COMPARISON:  None.  FINDINGS: Gallbladder: Echogenic sludge noted within the gallbladder, however no definite gallstones identified. Scattered tiny 1-2 mm nodular densities are seen along the gallbladder wall, suspicious for cholesterolosis which is of no clinical significance. No sonographic Murphy sign noted by sonographer.  Common bile duct: Diameter: 3 mm, within normal limits  Liver: Increased parenchymal echogenicity and capsular nodularity, consistent with cirrhosis. Mild perihepatic ascites also noted. No  liver mass visualized.  IVC: No abnormality visualized.  Pancreas: Visualized portion unremarkable.  Spleen: Size and appearance within normal limits.  Right Kidney: Length: 10.0 cm. Echogenicity within normal limits. Tiny 1 cm subcapsular cyst noted in upper pole. No mass or hydronephrosis visualized.  Left  Kidney: Length: 9.6 cm. Echogenicity within normal limits. Small cysts noted, largest measuring 2.1 cm. No mass or hydronephrosis visualized.  Abdominal aorta: No aneurysm visualized.  Other findings: None.  IMPRESSION: Gallbladder sludge, without definite gallstones or biliary ductal dilatation.  Hepatic cirrhosis and mild perihepatic ascites. No liver mass visualized.  Small bilateral renal cysts.  No evidence of hydronephrosis.   Electronically Signed   By: Earle Gell M.D.   On: 10/16/2014 13:48   Dg Chest Portable 1 View  10/15/2014   CLINICAL DATA:  Pre-transfusion chest radiograph. Initial encounter.  EXAM: PORTABLE CHEST - 1 VIEW  COMPARISON:  None.  FINDINGS: The lungs are well-aerated and clear. There is no evidence of focal opacification, pleural effusion or pneumothorax.  The cardiomediastinal silhouette is within normal limits. No acute osseous abnormalities are seen.  IMPRESSION: No acute cardiopulmonary process seen.   Electronically Signed   By: Garald Balding M.D.   On: 10/15/2014 02:28    Microbiology: Recent Results (from the past 240 hour(s))  MRSA PCR Screening     Status: None   Collection Time: 10/15/14  3:13 AM  Result Value Ref Range Status   MRSA by PCR NEGATIVE NEGATIVE Final    Comment:        The GeneXpert MRSA Assay (FDA approved for NASAL specimens only), is one component of a comprehensive MRSA colonization surveillance program. It is not intended to diagnose MRSA infection nor to guide or monitor treatment for MRSA infections.      Labs: Basic Metabolic Panel:  Recent Labs Lab 10/14/14 2103 10/15/14 1210 10/16/14 0602 10/17/14 0418 10/19/14 0538  NA 138 142 135 133* 135  K 3.7 4.1 3.8 3.8 3.5  CL 110 116* 112* 110 105  CO2 19* 17* 19* 18* 23  GLUCOSE 125* 81 152* 106* 97  BUN '12 10 7 8 6  '$ CREATININE 1.60* 1.53* 1.51* 1.48* 1.53*  CALCIUM 8.0* 7.3* 7.0* 7.0* 7.5*   Liver Function Tests:  Recent Labs Lab 10/14/14 2103 10/15/14 1210  10/16/14 0602 10/17/14 0418  AST 152* 115* 85* 74*  ALT 77* 58 48 43  ALKPHOS 114 92 84 81  BILITOT 2.5* 2.9* 2.5* 3.2*  PROT 7.1 5.6* 5.5* 5.7*  ALBUMIN 1.9* 1.6* 1.5* 1.5*   No results for input(s): LIPASE, AMYLASE in the last 168 hours. No results for input(s): AMMONIA in the last 168 hours. CBC:  Recent Labs Lab 10/14/14 2103 10/15/14 1615 10/15/14 2320 10/16/14 0657 10/17/14 0418 10/19/14 0538  WBC 3.8* 5.3 6.8 7.2 6.0 6.2  NEUTROABS 1.7  --   --   --   --   --   HGB 7.8* 10.0* 8.8* 8.6* 8.5* 8.4*  HCT 23.0* 28.7* 25.4* 25.4* 25.2* 24.7*  MCV 99.6 92.9 92.4 94.1 95.1 95.0  PLT 40* 30* 30* 32* 32* 42*   Cardiac Enzymes: No results for input(s): CKTOTAL, CKMB, CKMBINDEX, TROPONINI in the last 168 hours. BNP: BNP (last 3 results) No results for input(s): BNP in the last 8760 hours.  ProBNP (last 3 results) No results for input(s): PROBNP in the last 8760 hours.  CBG:  Recent Labs Lab 10/16/14 0738 10/17/14 0903 10/18/14 0755  GLUCAP 124* 154* 83  Signed:  Charlynne Cousins  [facility] 10/19/2014, 7:28 AM

## 2014-10-19 NOTE — Clinical Social Work Placement (Signed)
   CLINICAL SOCIAL WORK PLACEMENT  NOTE  Date:  10/19/2014  Patient Details  Name: RENTON BERKLEY MRN: [id number] Date of Birth: [birth year]  Clinical Social Work is seeking post-discharge placement for this patient at the [place] level of care (*CSW will initial, date and re-position this form in  chart as items are completed):  Yes   Patient/family provided with [place] Work Department's list of facilities offering this level of care within the geographic area requested by the patient (or if unable, by the patient's family).  Yes   Patient/family informed of their freedom to choose among providers that offer the needed level of care, that participate in Medicare, Medicaid or managed care program needed by the patient, have an available bed and are willing to accept the patient.  Yes   Patient/family informed of [place] [facility]'s ownership interest in [facility] and [facility], as well as of the fact that they are under no obligation to receive care at these facilities.  PASRR submitted to EDS on 10/17/14     PASRR number received on 10/17/14     Existing PASRR number confirmed on       FL2 transmitted to all facilities in geographic area requested by pt/family on 10/17/14     FL2 transmitted to all facilities within larger geographic area on       Patient informed that his/her managed care company has contracts with or will negotiate with certain facilities, including the following:        Yes   Patient/family informed of bed offers received.  Patient chooses bed at [facility]     Physician recommends and patient chooses bed at      Patient to be transferred to [facility] on 10/19/14.  Patient to be transferred to facility by Ambulance     Patient family notified on 10/19/14 of transfer.  Name of family member notified:  Patient notifying family.      PHYSICIAN Please sign FL2      Additional Comment:   Per MD patient ready for DC to [facility]. RN, patient, patient's family, and facility notified of DC. RN given number for report. DC packet on chart. Ambulance transport requested for patient for 1:30PM. CSW signing off.  _______________________________________________ Liz Beach MSW, [place], [place], [phone]

## 2014-10-19 NOTE — Progress Notes (Signed)
PT Cancellation Note  Patient Details Name: Chad Sparks MRN: [id number] DOB: 06-10-1950   Cancelled Treatment:    Reason Eval/Treat Not Completed: Patient declined, no reason specified   [place] 10/19/2014, 9:19 AM  [facility] PT [phone]

## 2014-10-19 NOTE — Care Management Note (Signed)
Case Management Note  Patient Details  Name: CAMRAN KEADY MRN: [id number] Date of Birth: 02-Jun-1950  Subjective/Objective:                 Pt to discharge today to SNF- [facility].    Action/Plan:   Expected Discharge Date:                  Expected Discharge Plan:  Home/Self Care  In-House Referral:  Clinical Social Work  Discharge planning Services  CM Consult  Post Acute Care Choice:    Choice offered to:  Patient  DME Arranged:    DME Agency:     HH Arranged:    [place] Agency:     Status of Service:  Completed, signed off  Medicare Important Message Given:  Yes-second notification given Date Medicare IM Given:    Medicare IM give by:    Date Additional Medicare IM Given:    Additional Medicare Important Message give by:     If discussed at [place] of Stay Meetings, dates discussed:    Additional Comments:  Carles Collet, RN 10/19/2014, 11:21 AM

## 2014-11-22 ENCOUNTER — Ambulatory Visit: Payer: Self-pay | Admitting: Nurse Practitioner

## 2014-11-26 ENCOUNTER — Emergency Department (INDEPENDENT_AMBULATORY_CARE_PROVIDER_SITE_OTHER)
Admission: EM | Admit: 2014-11-26 | Discharge: 2014-11-26 | Disposition: A | Discharge status: Nursing Home (Includes ICF) | Payer: Non-veteran care | Source: Home / Self Care | Attending: Family Medicine | Admitting: Family Medicine

## 2014-11-26 ENCOUNTER — Observation Stay (HOSPITAL_COMMUNITY)
Admission: EM | Admit: 2014-11-26 | Discharge: 2014-11-28 | Disposition: A | Discharge status: Home / Self Care | Payer: Non-veteran care | Attending: Internal Medicine | Admitting: Internal Medicine

## 2014-11-26 ENCOUNTER — Encounter (HOSPITAL_COMMUNITY): Payer: Self-pay | Admitting: Emergency Medicine

## 2014-11-26 ENCOUNTER — Encounter (HOSPITAL_COMMUNITY): Payer: Self-pay | Admitting: Cardiology

## 2014-11-26 ENCOUNTER — Emergency Department (HOSPITAL_COMMUNITY)
Admission: EM | Admit: 2014-11-26 | Discharge: 2014-11-26 | Disposition: A | Discharge status: Home / Self Care | Payer: Self-pay

## 2014-11-26 ENCOUNTER — Emergency Department (INDEPENDENT_AMBULATORY_CARE_PROVIDER_SITE_OTHER): Payer: Non-veteran care

## 2014-11-26 DIAGNOSIS — R5383 Other fatigue: Secondary | ICD-10-CM | POA: Diagnosis not present

## 2014-11-26 DIAGNOSIS — D649 Anemia, unspecified: Secondary | ICD-10-CM | POA: Diagnosis not present

## 2014-11-26 DIAGNOSIS — Z720 Tobacco use: Secondary | ICD-10-CM | POA: Diagnosis not present

## 2014-11-26 DIAGNOSIS — M7989 Other specified soft tissue disorders: Secondary | ICD-10-CM

## 2014-11-26 DIAGNOSIS — R600 Localized edema: Secondary | ICD-10-CM

## 2014-11-26 DIAGNOSIS — R0602 Shortness of breath: Secondary | ICD-10-CM

## 2014-11-26 DIAGNOSIS — D61818 Other pancytopenia: Secondary | ICD-10-CM | POA: Diagnosis present

## 2014-11-26 DIAGNOSIS — R635 Abnormal weight gain: Secondary | ICD-10-CM

## 2014-11-26 DIAGNOSIS — R0902 Hypoxemia: Secondary | ICD-10-CM

## 2014-11-26 DIAGNOSIS — Z79899 Other long term (current) drug therapy: Secondary | ICD-10-CM | POA: Diagnosis not present

## 2014-11-26 DIAGNOSIS — R609 Edema, unspecified: Secondary | ICD-10-CM

## 2014-11-26 DIAGNOSIS — R05 Cough: Secondary | ICD-10-CM | POA: Diagnosis not present

## 2014-11-26 DIAGNOSIS — N189 Chronic kidney disease, unspecified: Secondary | ICD-10-CM | POA: Diagnosis present

## 2014-11-26 DIAGNOSIS — B1920 Unspecified viral hepatitis C without hepatic coma: Secondary | ICD-10-CM

## 2014-11-26 DIAGNOSIS — J441 Chronic obstructive pulmonary disease with (acute) exacerbation: Secondary | ICD-10-CM | POA: Insufficient documentation

## 2014-11-26 DIAGNOSIS — K922 Gastrointestinal hemorrhage, unspecified: Principal | ICD-10-CM | POA: Insufficient documentation

## 2014-11-26 DIAGNOSIS — Z8619 Personal history of other infectious and parasitic diseases: Secondary | ICD-10-CM | POA: Insufficient documentation

## 2014-11-26 DIAGNOSIS — F1010 Alcohol abuse, uncomplicated: Secondary | ICD-10-CM | POA: Diagnosis present

## 2014-11-26 DIAGNOSIS — R2240 Localized swelling, mass and lump, unspecified lower limb: Secondary | ICD-10-CM | POA: Diagnosis present

## 2014-11-26 LAB — BASIC METABOLIC PANEL
ANION GAP: 10 (ref 5–15)
BUN: 8 mg/dL (ref 6–20)
CALCIUM: 7.8 mg/dL — AB (ref 8.9–10.3)
CO2: 17 mmol/L — AB (ref 22–32)
Chloride: 109 mmol/L (ref 101–111)
Creatinine, Ser: 1.45 mg/dL — ABNORMAL HIGH (ref 0.61–1.24)
GFR, EST AFRICAN AMERICAN: 57 mL/min — AB (ref >60)
GFR, EST NON AFRICAN AMERICAN: 49 mL/min — AB (ref >60)
Glucose, Bld: 106 mg/dL — ABNORMAL HIGH (ref 65–99)
POTASSIUM: 3.6 mmol/L (ref 3.5–5.1)
Sodium: 136 mmol/L (ref 135–145)

## 2014-11-26 LAB — HEPATIC FUNCTION PANEL
ALBUMIN: 1.6 g/dL — AB (ref 3.5–5.0)
ALK PHOS: 95 U/L (ref 38–126)
ALT: 26 U/L (ref 17–63)
AST: 58 U/L — AB (ref 15–41)
BILIRUBIN TOTAL: 1.2 mg/dL (ref 0.3–1.2)
Bilirubin, Direct: 0.7 mg/dL — ABNORMAL HIGH (ref 0.1–0.5)
Indirect Bilirubin: 0.5 mg/dL (ref 0.3–0.9)
TOTAL PROTEIN: 7.2 g/dL (ref 6.5–8.1)

## 2014-11-26 LAB — CBC
HEMATOCRIT: 20.4 % — AB (ref 39.0–52.0)
Hemoglobin: 6.8 g/dL — CL (ref 13.0–17.0)
MCH: 31.5 pg (ref 26.0–34.0)
MCHC: 33.3 g/dL (ref 30.0–36.0)
MCV: 94.4 fL (ref 78.0–100.0)
Platelets: 106 10*3/uL — ABNORMAL LOW (ref 150–400)
RBC: 2.16 MIL/uL — AB (ref 4.22–5.81)
RDW: 14.8 % (ref 11.5–15.5)
WBC: 6.1 10*3/uL (ref 4.0–10.5)

## 2014-11-26 LAB — RAPID URINE DRUG SCREEN, HOSP PERFORMED
AMPHETAMINES: NOT DETECTED
BENZODIAZEPINES: NOT DETECTED
Barbiturates: NOT DETECTED
Cocaine: NOT DETECTED
OPIATES: NOT DETECTED
TETRAHYDROCANNABINOL: NOT DETECTED

## 2014-11-26 LAB — PREPARE RBC (CROSSMATCH)

## 2014-11-26 LAB — APTT: aPTT: 46 seconds — ABNORMAL HIGH (ref 24–37)

## 2014-11-26 LAB — ETHANOL: ALCOHOL ETHYL (B): 125 mg/dL — AB (ref <5)

## 2014-11-26 LAB — BRAIN NATRIURETIC PEPTIDE: B Natriuretic Peptide: 28.2 pg/mL (ref 0.0–100.0)

## 2014-11-26 LAB — I-STAT TROPONIN, ED: TROPONIN I, POC: 0 ng/mL (ref 0.00–0.08)

## 2014-11-26 LAB — PROTIME-INR
INR: 1.84 — ABNORMAL HIGH (ref 0.00–1.49)
Prothrombin Time: 21.2 seconds — ABNORMAL HIGH (ref 11.6–15.2)

## 2014-11-26 MED ORDER — FUROSEMIDE 10 MG/ML IJ SOLN
40.0000 mg | Freq: Once | INTRAMUSCULAR | Status: AC
  Administered 2014-11-26: 40 mg via INTRAVENOUS
  Filled 2014-11-26: qty 4

## 2014-11-26 MED ORDER — ONDANSETRON HCL 4 MG PO TABS
4.0000 mg | ORAL_TABLET | Freq: Four times a day (QID) | ORAL | Status: DC | PRN
  Administered 2014-11-27: 4 mg via ORAL
  Filled 2014-11-26: qty 1

## 2014-11-26 MED ORDER — VITAMIN B-1 100 MG PO TABS
100.0000 mg | ORAL_TABLET | Freq: Every day | ORAL | Status: DC
  Administered 2014-11-27 – 2014-11-28 (×2): 100 mg via ORAL
  Filled 2014-11-26 (×3): qty 1

## 2014-11-26 MED ORDER — LORAZEPAM 2 MG/ML IJ SOLN: 1.0000 mg | Freq: Four times a day (QID) | INTRAMUSCULAR | Status: DC | PRN

## 2014-11-26 MED ORDER — IPRATROPIUM-ALBUTEROL 0.5-2.5 (3) MG/3ML IN SOLN
3.0000 mL | Freq: Once | RESPIRATORY_TRACT | Status: AC
  Administered 2014-11-26: 3 mL via RESPIRATORY_TRACT

## 2014-11-26 MED ORDER — ACETAMINOPHEN 650 MG RE SUPP
650.0000 mg | Freq: Four times a day (QID) | RECTAL | Status: DC | PRN
Start: 2014-11-26 — End: 2014-11-28

## 2014-11-26 MED ORDER — THIAMINE HCL 100 MG/ML IJ SOLN
100.0000 mg | Freq: Every day | INTRAMUSCULAR | Status: DC
  Administered 2014-11-26: 100 mg via INTRAVENOUS
  Filled 2014-11-26: qty 2

## 2014-11-26 MED ORDER — ADULT MULTIVITAMIN W/MINERALS CH
1.0000 | ORAL_TABLET | Freq: Every day | ORAL | Status: DC
  Administered 2014-11-27 – 2014-11-28 (×3): 1 via ORAL
  Filled 2014-11-26 (×3): qty 1

## 2014-11-26 MED ORDER — IPRATROPIUM-ALBUTEROL 0.5-2.5 (3) MG/3ML IN SOLN
RESPIRATORY_TRACT | Status: AC
  Filled 2014-11-26: qty 3

## 2014-11-26 MED ORDER — PANTOPRAZOLE SODIUM 40 MG IV SOLR
40.0000 mg | Freq: Two times a day (BID) | INTRAVENOUS | Status: DC
  Administered 2014-11-26 – 2014-11-28 (×4): 40 mg via INTRAVENOUS
  Filled 2014-11-26 (×4): qty 40

## 2014-11-26 MED ORDER — ACETAMINOPHEN 325 MG PO TABS: 650.0000 mg | ORAL_TABLET | Freq: Four times a day (QID) | ORAL | Status: DC | PRN

## 2014-11-26 MED ORDER — FOLIC ACID 1 MG PO TABS
1.0000 mg | ORAL_TABLET | Freq: Every day | ORAL | Status: DC
  Administered 2014-11-27 – 2014-11-28 (×3): 1 mg via ORAL
  Filled 2014-11-26 (×3): qty 1

## 2014-11-26 MED ORDER — SODIUM CHLORIDE 0.9 % IJ SOLN
3.0000 mL | Freq: Two times a day (BID) | INTRAMUSCULAR | Status: DC
  Administered 2014-11-27 – 2014-11-28 (×4): 3 mL via INTRAVENOUS

## 2014-11-26 MED ORDER — LORAZEPAM 1 MG PO TABS
1.0000 mg | ORAL_TABLET | Freq: Four times a day (QID) | ORAL | Status: DC | PRN
  Administered 2014-11-27: 1 mg via ORAL
  Filled 2014-11-26: qty 1

## 2014-11-26 MED ORDER — ALBUTEROL SULFATE (2.5 MG/3ML) 0.083% IN NEBU
INHALATION_SOLUTION | RESPIRATORY_TRACT | Status: AC
  Filled 2014-11-26: qty 3

## 2014-11-26 MED ORDER — SODIUM CHLORIDE 0.9 % IV SOLN
10.0000 mL/h | Freq: Once | INTRAVENOUS | Status: AC
  Administered 2014-11-26: 10 mL/h via INTRAVENOUS

## 2014-11-26 MED ORDER — ONDANSETRON HCL 4 MG/2ML IJ SOLN: 4.0000 mg | Freq: Four times a day (QID) | INTRAMUSCULAR | Status: DC | PRN

## 2014-11-26 MED ORDER — ALBUTEROL SULFATE (2.5 MG/3ML) 0.083% IN NEBU
2.5000 mg | INHALATION_SOLUTION | Freq: Once | RESPIRATORY_TRACT | Status: AC
  Administered 2014-11-26: 2.5 mg via RESPIRATORY_TRACT

## 2014-11-26 NOTE — ED Notes (Signed)
Pt reports swelling in bilateral legs for the past month. Reports pain with the swelling. Pt reports some SOB and cough recently.

## 2014-11-26 NOTE — ED Notes (Signed)
Dr. Patel at bedside 

## 2014-11-26 NOTE — ED Notes (Signed)
Pt being transferred to ED via shuttle for further evaluation for low O2 Sats and BLE swelling, 2 pitting edema. Report called to First RN in the ED, Caryl Pina.

## 2014-11-26 NOTE — ED Notes (Signed)
Pt here today for SOB and swelling in his feet and legs.  Pt has 2+ pitting edema and low oxygen saturation levels.

## 2014-11-26 NOTE — H&P (Signed)
[facility] History and Physical  Patient: Chad Sparks  MRN: [id number]  DOB: Jul 23, 1950  DOS: the patient was seen and examined on 11/26/2014 PCP: [facility]  Referring physician: Dr. Alvino Chapel Chief Complaint: Shortness of breath  HPI: Chad Sparks is a 64 y.o. male with Past medical history of hepatitis C,, cirrhosis, alcohol abuse, COPD, active smoker. The patient is presenting with complaints of shortness of breath progressively worsening over last 1 week. Denies having any fever chills cough. Denies having any active bleeding nausea vomiting. Denies having any burning urination. He mentions he has noted on and out episodes of bright red blood per rectum. Denies any black color bowel movements or loose watery bowel movements. He denies any sick contacts as well. No recent travel history.  The patient is coming from home.  At his baseline ambulates with walker And is independent for most of his ADL; manages his medication on his own.  Review of Systems: as mentioned in the history of present illness.  A comprehensive review of the other systems is negative.  Past Medical History  Diagnosis Date  . Hepatitis C   . COPD (chronic obstructive pulmonary disease)   . Heavy cigarette smoker   . Alcohol abuse    Past Surgical History  Procedure Laterality Date  . Esophagogastroduodenoscopy (egd) with propofol N/A 10/15/2014    Procedure: ESOPHAGOGASTRODUODENOSCOPY (EGD) WITH PROPOFOL;  Surgeon: Wonda Horner, MD;  Location: [facility] ENDOSCOPY;  Service: Endoscopy;  Laterality: N/A;   Social History:  reports that he has been smoking Cigarettes.  He has been smoking about 0.50 packs per day. He does not have any smokeless tobacco history on file. He reports that he drinks alcohol. He reports that he does not use illicit drugs.  No Known Allergies  Family History  Problem Relation Age of Onset  . Throat cancer Mother   . Emphysema Father   . Brain cancer Brother      Prior to Admission medications   Medication Sig Start Date End Date Taking? Authorizing Provider  pantoprazole (PROTONIX) 40 MG tablet Take 1 tablet (40 mg total) by mouth daily. 10/19/14  Yes Charlynne Cousins, MD    Physical Exam: Filed Vitals:   11/26/14 2030 11/26/14 2146 11/26/14 2155 11/26/14 2213  BP: 110/69 116/75 116/75 123/78  Pulse: 107 115 116 115  Temp:  98.3 F (36.8 C)  98.5 F (36.9 C)  TempSrc:  Oral  Oral  Resp: '14 16  16  '$ Weight:      SpO2: 100% 100%  94%    General: Alert, Awake and Oriented to Time, Place and Person. Appear in mild distress Eyes: PERRL ENT: Oral Mucosa clear moist. Neck: no JVD Cardiovascular: S1 and S2 Present, no Murmur, Peripheral Pulses Present Respiratory: Bilateral Air entry equal and Decreased,  Clear to Auscultation, no Crackles, no wheezes Abdomen: Bowel Sound present, Soft and no tenderness Skin: no Rash ExtremitiesBilateral  Pedal edema, no calf tenderness Neurologic: Grossly no focal neuro deficit. Labs on Admission:  CBC:  Recent Labs Lab 11/26/14 1853  WBC 6.1  HGB 6.8*  HCT 20.4*  MCV 94.4  PLT 106*    CMP     Component Value Date/Time   NA 136 11/26/2014 1853   K 3.6 11/26/2014 1853   CL 109 11/26/2014 1853   CO2 17* 11/26/2014 1853   GLUCOSE 106* 11/26/2014 1853   BUN 8 11/26/2014 1853   CREATININE 1.45* 11/26/2014 1853   CALCIUM 7.8* 11/26/2014 1853  PROT 7.2 11/26/2014 1854   ALBUMIN 1.6* 11/26/2014 1854   AST 58* 11/26/2014 1854   ALT 26 11/26/2014 1854   ALKPHOS 95 11/26/2014 1854   BILITOT 1.2 11/26/2014 1854   GFRNONAA 49* 11/26/2014 1853   GFRAA 57* 11/26/2014 1853    No results for input(s): CKTOTAL, CKMB, CKMBINDEX, TROPONINI in the last 168 hours. BNP (last 3 results)  Recent Labs  11/26/14 1854  BNP 28.2    ProBNP (last 3 results) No results for input(s): PROBNP in the last 8760 hours.   Radiological Exams on Admission: Dg Chest 2 View  11/26/2014   CLINICAL DATA:   Sick for 1 month, cough, COPD, smoker  EXAM: CHEST  2 VIEW  COMPARISON:  10/15/2014  FINDINGS: Normal heart size, mediastinal contours and pulmonary vascularity.  Lungs clear.  No definite pulmonary infiltrate, pleural effusion or pneumothorax.  Osseous structures unremarkable.  IMPRESSION: No acute abnormalities.   Electronically Signed   By: Lavonia Dana M.D.   On: 11/26/2014 16:43   EKG: Independently reviewed. normal sinus rhythm, nonspecific ST and T waves changes.  Assessment/Plan 1. Symptomatic anemia The patient is presenting with numbness of progressively worsening shortness of breath. He is found to be having anemia. Most likely the setting of cirrhosis as well as a slow chronic GI bleed. With this the patient will be admitted in the hospital on telemetry. He will receive one unit of PRBC. I would give him 40 mg of IV Lasix post procedure. Monitor ins and outs and daily weight. Recheck H&H and if remains lower would need GI evaluation in the hospital otherwise GI follow-up.  2. Hepatitis C Not on any medication. Patient recommended to go to [facility] on a regular basis for further workup.  3  Alcohol abuse Does not appear to be any withdrawal at present. We will continue close monitoring. On CIWA protocol currently.  4  Pancytopenia Chronic pancytopenia. We will continue close monitoring. Avoiding DVT prophylaxis pharmacological.  5  CKD (chronic kidney disease) Chronic kidney disease in the setting of cough hepatic cirrhosis. We will continue close to monitor.  6  Pedal edema Asymmetrical pedal edema. Will check ultrasound lower extremity in the morning.  Nutrition clear liquid diet DVT Prophylaxis: mechanical compression device  Advance goals of care discussion: Full code   Disposition: Admitted as observation, telemetry unit. Estimated length of stay: One to 2 day  Author: Berle Mull, MD [facility] Pager: [phone] 11/26/2014  If 7PM-7AM,  please contact night-coverage [email or web address] Password [email or web address]

## 2014-11-26 NOTE — ED Provider Notes (Signed)
CSN: [id number]     Arrival date & time 11/26/14  1407 History   First MD Initiated Contact with Patient 11/26/14 1610     Chief Complaint  Patient presents with  . Leg Swelling  . Shortness of Breath   (Consider location/radiation/quality/duration/timing/severity/associated sxs/prior Treatment) HPI  Swelling of bilat LE. Started 6 wks ago. Getting worse. Constant. Initially in ankles but migrating to upper thighs. Hot bath w/o benefit. No change after rest. SOB 6 wks ago. Also constant and getting worse. Cough started 6 wks ago. Non-productive. SOB worse w/ ambulation Denies fevers, diarrhea, constipation, hematochezia, CP, wheezing.     Past Medical History  Diagnosis Date  . Hepatitis C   . COPD (chronic obstructive pulmonary disease)   . Heavy cigarette smoker   . Alcohol abuse    Past Surgical History  Procedure Laterality Date  . Esophagogastroduodenoscopy (egd) with propofol N/A 10/15/2014    Procedure: ESOPHAGOGASTRODUODENOSCOPY (EGD) WITH PROPOFOL;  Surgeon: Wonda Horner, MD;  Location: [facility] ENDOSCOPY;  Service: Endoscopy;  Laterality: N/A;   Family History  Problem Relation Age of Onset  . Throat cancer Mother   . Emphysema Father   . Brain cancer Brother    Social History  Substance Use Topics  . Smoking status: Current Every Day Smoker -- 0.50 packs/day    Types: Cigarettes  . Smokeless tobacco: None  . Alcohol Use: Yes     Comment: social    Review of Systems Per HPI with all other pertinent systems negative.   Allergies  Review of patient's allergies indicates no known allergies.  Home Medications   Prior to Admission medications   Medication Sig Start Date End Date Taking? Authorizing Provider  pantoprazole (PROTONIX) 40 MG tablet Take 1 tablet (40 mg total) by mouth daily. 10/19/14  Yes Charlynne Cousins, MD   Meds Ordered and Administered this Visit   Medications  albuterol (PROVENTIL) (2.5 MG/3ML) 0.083% nebulizer solution 2.5 mg (2.5 mg  Nebulization Given 11/26/14 1711)  ipratropium-albuterol (DUONEB) 0.5-2.5 (3) MG/3ML nebulizer solution 3 mL (3 mLs Nebulization Given 11/26/14 1711)    BP 121/74 mmHg  Pulse 138  Temp(Src) 98.3 F (36.8 C) (Oral)  Resp 18  Wt 172 lb (78.019 kg)  SpO2 100% No data found.   Physical Exam Physical Exam  Constitutional: oriented to person, place, and time. appears well-developed and well-nourished. No distress.  HENT:  Head: Normocephalic and atraumatic.  Eyes: EOMI. PERRL.  Neck: Normal range of motion.  Cardiovascular: RRR, no m/r/g, 2+ distal pulses, 2+ bilat LE edema Pulmonary/Chest: Effort normal and breath sounds normal. No respiratory distress.  Abdominal: Soft. Bowel sounds are normal. NonTTP, no distension.  Musculoskeletal: Normal range of motion. Non ttp, no effusion.  Neurological: alert and oriented to person, place, and time.  Skin: Skin is warm. No rash noted. non diaphoretic.  Psychiatric: normal mood and affect. behavior is normal. Judgment and thought content normal.   ED Course  Procedures (including critical care time)  Labs Review Labs Reviewed - No data to display  Imaging Review Dg Chest 2 View  11/26/2014   CLINICAL DATA:  Sick for 1 month, cough, COPD, smoker  EXAM: CHEST  2 VIEW  COMPARISON:  10/15/2014  FINDINGS: Normal heart size, mediastinal contours and pulmonary vascularity.  Lungs clear.  No definite pulmonary infiltrate, pleural effusion or pneumothorax.  Osseous structures unremarkable.  IMPRESSION: No acute abnormalities.   Electronically Signed   By: Lavonia Dana M.D.   On: 11/26/2014 16:43  Visual Acuity Review  Right Eye Distance:   Left Eye Distance:   Bilateral Distance:    Right Eye Near:   Left Eye Near:    Bilateral Near:         MDM   1. SOB (shortness of breath)   2. Leg swelling   3. Weight gain   4. Hypoxemia    Etiology not immediately clear but warrants further workup - COPD (though lungs CTAB and nml effort),  CHF (no Echo or h/o), Cirrhosis (hep C and continues to drink ETOH), DVT (unlikely given bilat edema but possible), multifactorial.. Patient's sats initially 89%, checked on 2 separate pulse ox, improved to 100% after DuoNeb and separate albuterol nebulizer treatment. Patient states that his shortness of breath is unrelieved after breathing treatment.   CXR unremarkable  Pt w/ 15lb wt gain in past 3wks  Waldemar Dickens, MD 11/26/14 1735

## 2014-11-26 NOTE — ED Provider Notes (Signed)
CSN: [id number]     Arrival date & time 11/26/14  1738 History   First MD Initiated Contact with Patient 11/26/14 1812     Chief Complaint  Patient presents with  . Leg Swelling     (Consider location/radiation/quality/duration/timing/severity/associated sxs/prior Treatment) The history is provided by the patient.   patient was sent from urgent care. He has had swelling in his legs for last couple weeks. Has had shortness of breath also. Has had a little bit of cough. Has had some chest pain he states is from a cough. Some slight production. No fevers. Found to have oxygen saturations of 89%. He is a heavy smoker and heavy drinker. Has a history of hepatitis C and does continue to drink. States her legs are painful. States he did seen at the [place].  Past Medical History  Diagnosis Date  . Hepatitis C   . COPD (chronic obstructive pulmonary disease)   . Heavy cigarette smoker   . Alcohol abuse    Past Surgical History  Procedure Laterality Date  . Esophagogastroduodenoscopy (egd) with propofol N/A 10/15/2014    Procedure: ESOPHAGOGASTRODUODENOSCOPY (EGD) WITH PROPOFOL;  Surgeon: Wonda Horner, MD;  Location: [facility] ENDOSCOPY;  Service: Endoscopy;  Laterality: N/A;   Family History  Problem Relation Age of Onset  . Throat cancer Mother   . Emphysema Father   . Brain cancer Brother    Social History  Substance Use Topics  . Smoking status: Current Every Day Smoker -- 0.50 packs/day    Types: Cigarettes  . Smokeless tobacco: None  . Alcohol Use: Yes     Comment: social    Review of Systems  Constitutional: Positive for fatigue. Negative for activity change and appetite change.  Eyes: Negative for pain.  Respiratory: Positive for cough and shortness of breath. Negative for chest tightness.   Cardiovascular: Positive for chest pain and leg swelling.  Gastrointestinal: Negative for nausea, vomiting, abdominal pain and diarrhea.  Genitourinary: Negative for flank pain.   Musculoskeletal: Negative for back pain and neck stiffness.  Skin: Negative for rash.  Neurological: Negative for weakness, numbness and headaches.  Psychiatric/Behavioral: Negative for behavioral problems.      Allergies  Review of patient's allergies indicates no known allergies.  Home Medications   Prior to Admission medications   Medication Sig Start Date End Date Taking? Authorizing Provider  pantoprazole (PROTONIX) 40 MG tablet Take 1 tablet (40 mg total) by mouth daily. 10/19/14  Yes Charlynne Cousins, MD   BP 133/88 mmHg  Pulse 101  Temp(Src) 98.2 F (36.8 C) (Oral)  Resp 18  Ht 6' 2.5" (1.892 m)  Wt 172 lb (78.019 kg)  BMI 21.80 kg/m2  SpO2 100% Physical Exam  Constitutional: He is oriented to person, place, and time. He appears well-developed and well-nourished.  Patient smells like alcohol  HENT:  Head: Normocephalic and atraumatic.  Neck: Normal range of motion.  Cardiovascular:  No murmur heard. Tachycardia  Pulmonary/Chest:  Some tachypnea, but lungs clear.  Abdominal: Soft. Bowel sounds are normal. He exhibits no distension. There is no tenderness.  Musculoskeletal: He exhibits edema.  Moderate pitting edema to bilateral lower legs.  Neurological: He is alert and oriented to person, place, and time. No cranial nerve deficit.  Skin: Skin is warm and dry.  Nursing note and vitals reviewed.   ED Course  Procedures (including critical care time) Labs Review Labs Reviewed  BASIC METABOLIC PANEL - Abnormal; Notable for the following:    CO2 17 (*)  Glucose, Bld 106 (*)    Creatinine, Ser 1.45 (*)    Calcium 7.8 (*)    GFR calc non Af Amer 49 (*)    GFR calc Af Amer 57 (*)    All other components within normal limits  CBC - Abnormal; Notable for the following:    RBC 2.16 (*)    Hemoglobin 6.8 (*)    HCT 20.4 (*)    Platelets 106 (*)    All other components within normal limits  HEPATIC FUNCTION PANEL - Abnormal; Notable for the following:     Albumin 1.6 (*)    AST 58 (*)    Bilirubin, Direct 0.7 (*)    All other components within normal limits  ETHANOL - Abnormal; Notable for the following:    Alcohol, Ethyl (B) 125 (*)    All other components within normal limits  PROTIME-INR - Abnormal; Notable for the following:    Prothrombin Time 21.2 (*)    INR 1.84 (*)    All other components within normal limits  APTT - Abnormal; Notable for the following:    aPTT 46 (*)    All other components within normal limits  BRAIN NATRIURETIC PEPTIDE  URINE RAPID DRUG SCREEN, HOSP PERFORMED  I-STAT TROPOININ, ED  POC OCCULT BLOOD, ED  TYPE AND SCREEN  PREPARE RBC (CROSSMATCH)    Imaging Review Dg Chest 2 View  11/26/2014   CLINICAL DATA:  Sick for 1 month, cough, COPD, smoker  EXAM: CHEST  2 VIEW  COMPARISON:  10/15/2014  FINDINGS: Normal heart size, mediastinal contours and pulmonary vascularity.  Lungs clear.  No definite pulmonary infiltrate, pleural effusion or pneumothorax.  Osseous structures unremarkable.  IMPRESSION: No acute abnormalities.   Electronically Signed   By: Lavonia Dana M.D.   On: 11/26/2014 16:43   I have personally reviewed and evaluated these images and lab results as part of my medical decision-making.   EKG Interpretation   Date/Time:  Monday November 26 2014 18:09:10 EDT Ventricular Rate:  141 PR Interval:  126 QRS Duration: 78 QT Interval:  290 QTC Calculation: 444 R Axis:   12 Text Interpretation:  indeterminate rhythm. tachycardia Confirmed by  Alvino Chapel  MD, Ovid Curd [phone]) on 11/26/2014 6:16:01 PM      MDM   Final diagnoses:  Gastrointestinal hemorrhage, unspecified gastritis, unspecified gastrointestinal hemorrhage type    Patient with shortness of breath and leg swelling. Found to have recurrent anemia. Previous upper GI bleed that required transfusion. Anemia here. States that he has had some black stools on further questioning. Admit to internal medicine.    Davonna Belling,  MD 11/27/14 [phone]

## 2014-11-26 NOTE — ED Notes (Signed)
Consent signed by patient to receive blood products. Blood bank has blood ready for transfusion

## 2014-11-26 NOTE — ED Notes (Signed)
Second RN at bedside attempting IV

## 2014-11-26 NOTE — ED Notes (Signed)
IV attempt x1

## 2014-11-26 NOTE — ED Notes (Signed)
hemoglobin 6.8 Dr. Alvino Chapel informed

## 2014-11-27 DIAGNOSIS — B1710 Acute hepatitis C without hepatic coma: Secondary | ICD-10-CM

## 2014-11-27 DIAGNOSIS — D649 Anemia, unspecified: Secondary | ICD-10-CM | POA: Diagnosis not present

## 2014-11-27 DIAGNOSIS — R600 Localized edema: Secondary | ICD-10-CM | POA: Diagnosis not present

## 2014-11-27 DIAGNOSIS — D61818 Other pancytopenia: Secondary | ICD-10-CM | POA: Diagnosis not present

## 2014-11-27 LAB — COMPREHENSIVE METABOLIC PANEL
ALT: 22 U/L (ref 17–63)
ANION GAP: 6 (ref 5–15)
AST: 46 U/L — ABNORMAL HIGH (ref 15–41)
Albumin: 1.4 g/dL — ABNORMAL LOW (ref 3.5–5.0)
Alkaline Phosphatase: 71 U/L (ref 38–126)
BUN: 7 mg/dL (ref 6–20)
CHLORIDE: 108 mmol/L (ref 101–111)
CO2: 23 mmol/L (ref 22–32)
Calcium: 7.7 mg/dL — ABNORMAL LOW (ref 8.9–10.3)
Creatinine, Ser: 1.36 mg/dL — ABNORMAL HIGH (ref 0.61–1.24)
GFR calc non Af Amer: 53 mL/min — ABNORMAL LOW (ref >60)
Glucose, Bld: 86 mg/dL (ref 65–99)
POTASSIUM: 3.8 mmol/L (ref 3.5–5.1)
SODIUM: 137 mmol/L (ref 135–145)
Total Bilirubin: 1.8 mg/dL — ABNORMAL HIGH (ref 0.3–1.2)
Total Protein: 5.7 g/dL — ABNORMAL LOW (ref 6.5–8.1)

## 2014-11-27 LAB — CBC WITH DIFFERENTIAL/PLATELET
Basophils Absolute: 0 10*3/uL (ref 0.0–0.1)
Basophils Relative: 1 %
EOS ABS: 0.1 10*3/uL (ref 0.0–0.7)
EOS PCT: 2 %
HCT: 21.7 % — ABNORMAL LOW (ref 39.0–52.0)
Hemoglobin: 7.1 g/dL — ABNORMAL LOW (ref 13.0–17.0)
LYMPHS ABS: 2.2 10*3/uL (ref 0.7–4.0)
Lymphocytes Relative: 43 %
MCH: 30.7 pg (ref 26.0–34.0)
MCHC: 32.7 g/dL (ref 30.0–36.0)
MCV: 93.9 fL (ref 78.0–100.0)
MONO ABS: 0.9 10*3/uL (ref 0.1–1.0)
Monocytes Relative: 17 %
Neutro Abs: 1.9 10*3/uL (ref 1.7–7.7)
Neutrophils Relative %: 38 %
PLATELETS: 88 10*3/uL — AB (ref 150–400)
RBC: 2.31 MIL/uL — AB (ref 4.22–5.81)
RDW: 14.8 % (ref 11.5–15.5)
WBC: 5.1 10*3/uL (ref 4.0–10.5)

## 2014-11-27 LAB — TYPE AND SCREEN
ABO/RH(D): O POS
Antibody Screen: NEGATIVE
UNIT DIVISION: 0

## 2014-11-27 LAB — PROTIME-INR
INR: 1.98 — AB (ref 0.00–1.49)
PROTHROMBIN TIME: 22.4 s — AB (ref 11.6–15.2)

## 2014-11-27 NOTE — Progress Notes (Signed)
PROGRESS NOTE  Chad Sparks QQI:[id number] DOB: 1950-10-08 DOA: 11/26/2014 PCP: [facility]  HPI/Recap of past 24 hours: Feeling better, poor historian,   Assessment/Plan: Principal Problem:   Symptomatic anemia Active Problems:   Hepatitis C   Alcohol abuse   Pancytopenia   CKD (chronic kidney disease)   Pedal edema  1. Symptomatic anemia With progressively worsening shortness of breath. Anemia in setting of cirrhosis as well as a slow chronic GI bleed. S/p  PRBCx1 with lasix '40mg'$  post transfusion. No active gi bleed, Recheck H&H and if remains lower would need GI evaluation in the hospital otherwise GI follow-up. On ppi  2. Hepatitis C Not on any medication. Patient recommended to go to [facility] on a regular basis for further workup. Care manager consulted to help with transportation to [place].  3 Alcohol abuse Does not appear to be any withdrawal at present. on CIWA protocol currently. Reported only drink on the weekend.  4 Pancytopenia Chronic pancytopenia. Avoiding DVT prophylaxis pharmacological.  5 CKD (chronic kidney disease) Chronic kidney disease in the setting of cough hepatic cirrhosis. Stable at baseline  6 Pedal edema Asymmetrical pedal edema ultrasound lower extremity pending,  If no DVT, consider lasix/spironolactone at discharge   Soft  diet DVT Prophylaxis: mechanical compression device  Advance goals of care discussion: Full code   Family Communication: patient   Disposition Plan: home with home health? Pending PT   Consultants:  none  Procedures:  prbc transfuisonx1unit  Antibiotics:  none   Objective: BP 110/83 mmHg  Pulse 99  Temp(Src) 98.2 F (36.8 C) (Oral)  Resp 18  Ht 6' 2.5" (1.892 m)  Wt 172 lb (78.019 kg)  BMI 21.80 kg/m2  SpO2 100%  Intake/Output Summary (Last 24 hours) at 11/27/14 2021 Last data filed at 11/27/14 1749  Gross per 24 hour  Intake   2445 ml  Output    700 ml  Net   1745 ml     Filed Weights   11/26/14 1807 11/27/14 0001  Weight: 172 lb (78.019 kg) 172 lb (78.019 kg)    Exam:   General:  NAD, frail  Cardiovascular: RRR  Respiratory: CTABL  Abdomen: Soft/ND/NT, positive BS  Musculoskeletal: No Edema  Neuro: aaox3  Data Reviewed: Basic Metabolic Panel:  Recent Labs Lab 11/26/14 1853 11/27/14 0716  NA 136 137  K 3.6 3.8  CL 109 108  CO2 17* 23  GLUCOSE 106* 86  BUN 8 7  CREATININE 1.45* 1.36*  CALCIUM 7.8* 7.7*   Liver Function Tests:  Recent Labs Lab 11/26/14 1854 11/27/14 0716  AST 58* 46*  ALT 26 22  ALKPHOS 95 71  BILITOT 1.2 1.8*  PROT 7.2 5.7*  ALBUMIN 1.6* 1.4*   No results for input(s): LIPASE, AMYLASE in the last 168 hours. No results for input(s): AMMONIA in the last 168 hours. CBC:  Recent Labs Lab 11/26/14 1853 11/27/14 0716  WBC 6.1 5.1  NEUTROABS  --  1.9  HGB 6.8* 7.1*  HCT 20.4* 21.7*  MCV 94.4 93.9  PLT 106* 88*   Cardiac Enzymes:   No results for input(s): CKTOTAL, CKMB, CKMBINDEX, TROPONINI in the last 168 hours. BNP (last 3 results)  Recent Labs  11/26/14 1854  BNP 28.2    ProBNP (last 3 results) No results for input(s): PROBNP in the last 8760 hours.  CBG: No results for input(s): GLUCAP in the last 168 hours.  No results found for this or any previous visit (from the  past 240 hour(s)).   Studies: No results found.  Scheduled Meds: . folic acid  1 mg Oral Daily  . multivitamin with minerals  1 tablet Oral Daily  . pantoprazole (PROTONIX) IV  40 mg Intravenous Q12H  . sodium chloride  3 mL Intravenous Q12H  . thiamine  100 mg Oral Daily   Or  . thiamine  100 mg Intravenous Daily    Continuous Infusions:    Time spent: 38mns  Xu,Fang MD, PhD  [facility] Pager [phone] If 7PM-7AM, please contact night-coverage at [email or web address], password [email or web address]9/27/2016, 8:21 PM

## 2014-11-28 ENCOUNTER — Observation Stay (HOSPITAL_BASED_OUTPATIENT_CLINIC_OR_DEPARTMENT_OTHER): Payer: Non-veteran care

## 2014-11-28 DIAGNOSIS — B1710 Acute hepatitis C without hepatic coma: Secondary | ICD-10-CM | POA: Diagnosis not present

## 2014-11-28 DIAGNOSIS — F1010 Alcohol abuse, uncomplicated: Secondary | ICD-10-CM | POA: Diagnosis not present

## 2014-11-28 DIAGNOSIS — R609 Edema, unspecified: Secondary | ICD-10-CM

## 2014-11-28 DIAGNOSIS — D649 Anemia, unspecified: Secondary | ICD-10-CM | POA: Diagnosis not present

## 2014-11-28 DIAGNOSIS — N189 Chronic kidney disease, unspecified: Secondary | ICD-10-CM | POA: Diagnosis not present

## 2014-11-28 LAB — BASIC METABOLIC PANEL
Anion gap: 3 — ABNORMAL LOW (ref 5–15)
BUN: 7 mg/dL (ref 6–20)
CALCIUM: 7.5 mg/dL — AB (ref 8.9–10.3)
CHLORIDE: 107 mmol/L (ref 101–111)
CO2: 23 mmol/L (ref 22–32)
CREATININE: 1.45 mg/dL — AB (ref 0.61–1.24)
GFR, EST AFRICAN AMERICAN: 57 mL/min — AB (ref >60)
GFR, EST NON AFRICAN AMERICAN: 49 mL/min — AB (ref >60)
Glucose, Bld: 87 mg/dL (ref 65–99)
Potassium: 4 mmol/L (ref 3.5–5.1)
SODIUM: 133 mmol/L — AB (ref 135–145)

## 2014-11-28 LAB — CBC
HCT: 21.4 % — ABNORMAL LOW (ref 39.0–52.0)
HEMOGLOBIN: 7.1 g/dL — AB (ref 13.0–17.0)
MCH: 30.2 pg (ref 26.0–34.0)
MCHC: 33.2 g/dL (ref 30.0–36.0)
MCV: 91.1 fL (ref 78.0–100.0)
PLATELETS: 84 10*3/uL — AB (ref 150–400)
RBC: 2.35 MIL/uL — ABNORMAL LOW (ref 4.22–5.81)
RDW: 14.7 % (ref 11.5–15.5)
WBC: 5.8 10*3/uL (ref 4.0–10.5)

## 2014-11-28 LAB — MAGNESIUM: MAGNESIUM: 1.6 mg/dL — AB (ref 1.7–2.4)

## 2014-11-28 MED ORDER — PANTOPRAZOLE SODIUM 40 MG PO TBEC: 40.0000 mg | DELAYED_RELEASE_TABLET | Freq: Two times a day (BID) | ORAL | Status: DC

## 2014-11-28 NOTE — Evaluation (Signed)
Physical Therapy Evaluation Patient Details Name: Chad Sparks MRN: [id number] DOB: [birth year] Today's Date: 11/28/2014   History of Present Illness  HPI: Chad Sparks is a 64 y.o. male with Past medical history of hepatitis C,, cirrhosis, alcohol abuse, COPD, active smoker.; Admitted with shortness of breath and symptomatic anemia  Clinical Impression   Pt admitted with above diagnosis. Pt currently with functional limitations due to the deficits listed below (see PT Problem List).  Pt will benefit from skilled PT to increase their independence and safety with mobility to allow discharge to the venue listed below.       Follow Up Recommendations Home health PT    Equipment Recommendations  None recommended by PT    Recommendations for Other Services       Precautions / Restrictions Precautions Precautions: Fall Precaution Comments: Fall risk greatly reduced with use of RW      Mobility  Bed Mobility Overal bed mobility: Independent                Transfers Overall transfer level: Needs assistance Equipment used: 4-wheeled walker Transfers: Sit to/from Stand Sit to Stand: Min guard         General transfer comment: Initialy difficulty with rise, but better after scooting closer to edge; dependent on UE push  Ambulation/Gait Ambulation/Gait assistance: Min guard;Supervision Ambulation Distance (Feet): 100 Feet Assistive device: 4-wheeled walker Gait Pattern/deviations: Step-through pattern (trunk slightly flexed)     General Gait Details: Cues to self-monitor for activity tolerance  Stairs            Wheelchair Mobility    Modified Rankin (Stroke Patients Only)       Balance                                             Pertinent Vitals/Pain Pain Assessment: No/denies pain    Home Living Family/patient expects to be discharged to:: Private residence Living Arrangements: Alone Available Help at Discharge: Other  (Comment) (sisters check on pt occasionallly ) Type of Home: Apartment Home Access: Level entry     Home Layout: One level Home Equipment: Walker - 4 wheels      Prior Function Level of Independence: Independent with assistive device(s)         Comments: pt reports continued decline over the last year and that he doesnt' leave his apartment much     Hand Dominance   Dominant Hand: Right    Extremity/Trunk Assessment   Upper Extremity Assessment: Overall WFL for tasks assessed           Lower Extremity Assessment: Generalized weakness         Communication   Communication: No difficulties  Cognition Arousal/Alertness: Awake/alert Behavior During Therapy: WFL for tasks assessed/performed Overall Cognitive Status: Within Functional Limits for tasks assessed                      General Comments General comments (skin integrity, edema, etc.): not Orthostatic, See vitals flow sheet.     Exercises        Assessment/Plan    PT Assessment Patient needs continued PT services  PT Diagnosis Generalized weakness   PT Problem List Decreased strength;Decreased activity tolerance;Decreased balance;Decreased mobility  PT Treatment Interventions DME instruction;Gait training;Functional mobility training;Therapeutic activities;Therapeutic exercise;Balance training;Patient/family education   PT Goals (Current goals can be  found in the Care Plan section) Acute Rehab PT Goals Patient Stated Goal: hopes to get better PT Goal Formulation: With patient Time For Goal Achievement: 12/05/14 Potential to Achieve Goals: Good    Frequency Min 3X/week   Barriers to discharge        Co-evaluation               End of Session Equipment Utilized During Treatment: Gait belt Activity Tolerance: Patient tolerated treatment well Patient left: in chair;with call bell/phone within reach Nurse Communication: Mobility status    Functional Assessment Tool Used:  Clinical Judgement Functional Limitation: Mobility: Walking and moving around Mobility: Walking and Moving Around Current Status (J1941): At least 1 percent but less than 20 percent impaired, limited or restricted Mobility: Walking and Moving Around Goal Status [phone]): 0 percent impaired, limited or restricted    Time: 1236-1250 PT Time Calculation (min) (ACUTE ONLY): 14 min   Charges:         PT G Codes:   PT G-Codes **NOT FOR INPATIENT CLASS** Functional Assessment Tool Used: Clinical Judgement Functional Limitation: Mobility: Walking and moving around Mobility: Walking and Moving Around Current Status (K4818): At least 1 percent but less than 20 percent impaired, limited or restricted Mobility: Walking and Moving Around Goal Status [phone]): 0 percent impaired, limited or restricted    Roney Marion [facility] 11/28/2014, 1:45 PM  Roney Marion, [place] Pager [phone] Office [phone]

## 2014-11-28 NOTE — Progress Notes (Signed)
Walked with 1+ assist last night around 2130 without oxygen. Oxygen checked and sat 98%-99 %.

## 2014-11-28 NOTE — Discharge Summary (Signed)
Physician Discharge Summary   Patient ID: Chad Sparks MRN: [id number] DOB/AGE: 1951/01/28 64 y.o.  Admit date: 11/26/2014 Discharge date: 11/28/2014  Primary Care Physician:  [facility]  Discharge Diagnoses:    . Symptomatic anemia . Hepatitis C . Alcohol abuse . Pancytopenia . CKD (chronic kidney disease) . Pedal edema  Consults:  none   Recommendations for Outpatient Follow-up:    TESTS THAT NEED FOLLOW-UP cbc   DIET heart healthy diet    Allergies:  No Known Allergies   Discharge Medications:   Medication List    TAKE these medications        pantoprazole 40 MG tablet  Commonly known as:  PROTONIX  Take 1 tablet (40 mg total) by mouth 2 (two) times daily before a meal.         Brief H and P: For complete details please refer to admission H and P, but in brief patient is 64 year old male with a known history of hepatitis C, cirrhosis, alcohol abuse, COPD, active smoker, had presented with shortness of breath over last 1 week, no fevers or chills. Denied any active bleeding, nausea or vomiting. He had noticed off and on episodes of bright red blood per rectum otherwise denied any melena or diarrhea. Patient was found to have a hemoglobin of 6.8 and hence was admitted.  Hospital Course:     Symptomatic anemia - Patient has a history of liver cirrhosis, hepatitis C and chronic oozing. He was admitted last month with similar presentation. The patient was placed on Protonix. GI was consulted and patient underwent endoscopy (Dr. Penelope Coop) which showed no signs of bleeding and was normal. Patient refused colonoscopy at that time and it was recommended GI follow-up. Patient did not follow-up. During this admission patient was transfused 1 unit packed RBC, hemoglobin remained stable after the transfusion. He did not have any any active bleeding. I discussed in detail with Dr. Amedeo Plenty on the phone who recommended outpatient follow-up with gastroenterology,  colonoscopy to be scheduled with Dr. Penelope Coop. Dr. Amedeo Plenty took the patient's information to schedule the patient's follow-up. I discussed in detail with patient and his wife at the bedside and he is willing to undergo outpatient colonoscopy and GI follow-up. Normally he follows up at [facility] [place].  History of alcohol abuse Patient was strongly counseled to quit alcohol, he had no signs of withdrawal  Chronic kidney disease stage III Creatinine remained stable  Pancytopenia Due to liver disease and alcohol abuse  Bilateral lower extremity edema - Venous duplex did not show any lower extremity DVTs. Patient's BP is borderline low, recommended strongly to follow up with gastroenterology. He will benefit from diuretics, once more stable, to be started by GI outpatient.   Day of Discharge BP 100/65 mmHg  Pulse 88  Temp(Src) 99.6 F (37.6 C) (Oral)  Resp 18  Ht 6' 2.5" (1.892 m)  Wt 78.109 kg (172 lb 3.2 oz)  BMI 21.82 kg/m2  SpO2 98%  Physical Exam: General: Alert and awake oriented x3 not in any acute distress. HEENT: anicteric sclera, pupils reactive to light and accommodation CVS: S1-S2 clear no murmur rubs or gallops Chest: clear to auscultation bilaterally, no wheezing rales or rhonchi Abdomen: soft nontender, nondistended, normal bowel sounds Extremities: no cyanosis, clubbing, 2+ edema noted bilaterally Neuro: Cranial nerves II-XII intact, no focal neurological deficits   The results of significant diagnostics from this hospitalization (including imaging, microbiology, ancillary and laboratory) are listed below for reference.    LAB  RESULTS: Basic Metabolic Panel:  Recent Labs Lab 11/27/14 0716 11/28/14 0604  NA 137 133*  K 3.8 4.0  CL 108 107  CO2 23 23  GLUCOSE 86 87  BUN 7 7  CREATININE 1.36* 1.45*  CALCIUM 7.7* 7.5*  MG  --  1.6*   Liver Function Tests:  Recent Labs Lab 11/26/14 1854 11/27/14 0716  AST 58* 46*  ALT 26 22  ALKPHOS 95 71   BILITOT 1.2 1.8*  PROT 7.2 5.7*  ALBUMIN 1.6* 1.4*   No results for input(s): LIPASE, AMYLASE in the last 168 hours. No results for input(s): AMMONIA in the last 168 hours. CBC:  Recent Labs Lab 11/27/14 0716 11/28/14 0604  WBC 5.1 5.8  NEUTROABS 1.9  --   HGB 7.1* 7.1*  HCT 21.7* 21.4*  MCV 93.9 91.1  PLT 88* 84*   Cardiac Enzymes: No results for input(s): CKTOTAL, CKMB, CKMBINDEX, TROPONINI in the last 168 hours. BNP: Invalid input(s): POCBNP CBG: No results for input(s): GLUCAP in the last 168 hours.  Significant Diagnostic Studies:  Dg Chest 2 View  11/26/2014   CLINICAL DATA:  Sick for 1 month, cough, COPD, smoker  EXAM: CHEST  2 VIEW  COMPARISON:  10/15/2014  FINDINGS: Normal heart size, mediastinal contours and pulmonary vascularity.  Lungs clear.  No definite pulmonary infiltrate, pleural effusion or pneumothorax.  Osseous structures unremarkable.  IMPRESSION: No acute abnormalities.   Electronically Signed   By: Lavonia Dana M.D.   On: 11/26/2014 16:43    2D ECHO:   Disposition and Follow-up:     Discharge Instructions    Diet - low sodium heart healthy    Complete by:  As directed      Increase activity slowly    Complete by:  As directed             DISPOSITION: Home  DISCHARGE FOLLOW-UP Follow-up Information    Follow up with Cassell Clement, MD. Schedule an appointment as soon as possible for a visit in 2 weeks.   Specialty:  Gastroenterology   Why:  for hospital follow-up. Office will call to set up appointment for colonoscopy.   Contact information:   [address] [place] [address] [phone]        Time spent on Discharge: 35 mins   Signed:   RAI,RIPUDEEP M.D. [facility] 11/28/2014, 12:15 PM Pager: [phone]

## 2014-11-28 NOTE — Progress Notes (Signed)
Preliminary results by tech - Bilateral lower ext. Venous duplex completed. Negative for deep and superficial vein thrombosis in the both lower extremities. Oda Cogan, BS, RDMS, RVT

## 2015-01-31 ENCOUNTER — Encounter: Payer: Self-pay | Admitting: Medical

## 2015-01-31 ENCOUNTER — Telehealth: Payer: Self-pay

## 2015-01-31 ENCOUNTER — Ambulatory Visit (HOSPITAL_BASED_OUTPATIENT_CLINIC_OR_DEPARTMENT_OTHER)
Admission: RE | Admit: 2015-01-31 | Discharge: 2015-01-31 | Disposition: A | Discharge status: Home / Self Care | Payer: Medicare Other | Source: Ambulatory Visit | Attending: Medical | Admitting: Medical

## 2015-01-31 ENCOUNTER — Ambulatory Visit (INDEPENDENT_AMBULATORY_CARE_PROVIDER_SITE_OTHER): Payer: Non-veteran care | Admitting: Medical

## 2015-01-31 VITALS — BP 110/70 | HR 98 | Temp 97.9°F | Ht 74.5 in | Wt 174.0 lb

## 2015-01-31 DIAGNOSIS — M79609 Pain in unspecified limb: Secondary | ICD-10-CM

## 2015-01-31 DIAGNOSIS — Z23 Encounter for immunization: Secondary | ICD-10-CM | POA: Diagnosis not present

## 2015-01-31 DIAGNOSIS — R600 Localized edema: Secondary | ICD-10-CM

## 2015-01-31 DIAGNOSIS — F1011 Alcohol abuse, in remission: Secondary | ICD-10-CM

## 2015-01-31 DIAGNOSIS — F1010 Alcohol abuse, uncomplicated: Secondary | ICD-10-CM | POA: Diagnosis not present

## 2015-01-31 DIAGNOSIS — D649 Anemia, unspecified: Secondary | ICD-10-CM

## 2015-01-31 DIAGNOSIS — J441 Chronic obstructive pulmonary disease with (acute) exacerbation: Secondary | ICD-10-CM

## 2015-01-31 DIAGNOSIS — R0600 Dyspnea, unspecified: Secondary | ICD-10-CM

## 2015-01-31 DIAGNOSIS — Z8719 Personal history of other diseases of the digestive system: Secondary | ICD-10-CM

## 2015-01-31 DIAGNOSIS — R05 Cough: Secondary | ICD-10-CM | POA: Insufficient documentation

## 2015-01-31 DIAGNOSIS — J209 Acute bronchitis, unspecified: Secondary | ICD-10-CM

## 2015-01-31 DIAGNOSIS — K219 Gastro-esophageal reflux disease without esophagitis: Secondary | ICD-10-CM

## 2015-01-31 DIAGNOSIS — M79605 Pain in left leg: Secondary | ICD-10-CM | POA: Insufficient documentation

## 2015-01-31 DIAGNOSIS — M7122 Synovial cyst of popliteal space [Baker], left knee: Secondary | ICD-10-CM | POA: Insufficient documentation

## 2015-01-31 DIAGNOSIS — J449 Chronic obstructive pulmonary disease, unspecified: Secondary | ICD-10-CM

## 2015-01-31 DIAGNOSIS — M79604 Pain in right leg: Secondary | ICD-10-CM | POA: Insufficient documentation

## 2015-01-31 DIAGNOSIS — M79606 Pain in leg, unspecified: Secondary | ICD-10-CM

## 2015-01-31 DIAGNOSIS — R918 Other nonspecific abnormal finding of lung field: Secondary | ICD-10-CM | POA: Insufficient documentation

## 2015-01-31 LAB — CBC WITH DIFFERENTIAL/PLATELET
BASOS PCT: 0.3 % (ref 0.0–3.0)
Basophils Absolute: 0 10*3/uL (ref 0.0–0.1)
EOS PCT: 0.7 % (ref 0.0–5.0)
Eosinophils Absolute: 0 10*3/uL (ref 0.0–0.7)
HEMATOCRIT: 30 % — AB (ref 39.0–52.0)
HEMOGLOBIN: 9.3 g/dL — AB (ref 13.0–17.0)
LYMPHS PCT: 26.5 % (ref 12.0–46.0)
Lymphs Abs: 1.7 10*3/uL (ref 0.7–4.0)
MCHC: 30.8 g/dL (ref 30.0–36.0)
MCV: 81.8 fl (ref 78.0–100.0)
Monocytes Absolute: 1.1 10*3/uL — ABNORMAL HIGH (ref 0.1–1.0)
Monocytes Relative: 16.9 % — ABNORMAL HIGH (ref 3.0–12.0)
Neutro Abs: 3.6 10*3/uL (ref 1.4–7.7)
Neutrophils Relative %: 55.6 % (ref 43.0–77.0)
Platelets: 111 10*3/uL — ABNORMAL LOW (ref 150.0–400.0)
RBC: 3.67 Mil/uL — ABNORMAL LOW (ref 4.22–5.81)
RDW: 22.5 % — AB (ref 11.5–15.5)
WBC: 6.4 10*3/uL (ref 4.0–10.5)

## 2015-01-31 LAB — COMPREHENSIVE METABOLIC PANEL
ALBUMIN: 2 g/dL — AB (ref 3.5–5.2)
ALT: 16 U/L (ref 0–53)
AST: 42 U/L — ABNORMAL HIGH (ref 0–37)
Alkaline Phosphatase: 79 U/L (ref 39–117)
BUN: 7 mg/dL (ref 6–23)
CALCIUM: 8.2 mg/dL — AB (ref 8.4–10.5)
CHLORIDE: 106 meq/L (ref 96–112)
CO2: 24 meq/L (ref 19–32)
Creatinine, Ser: 1.24 mg/dL (ref 0.40–1.50)
GFR: 75 mL/min (ref >60.00)
GLUCOSE: 111 mg/dL — AB (ref 70–99)
POTASSIUM: 3.8 meq/L (ref 3.5–5.1)
Sodium: 135 mEq/L (ref 135–145)
Total Bilirubin: 1.4 mg/dL — ABNORMAL HIGH (ref 0.2–1.2)
Total Protein: 7.3 g/dL (ref 6.0–8.3)

## 2015-01-31 LAB — BRAIN NATRIURETIC PEPTIDE: Pro B Natriuretic peptide (BNP): 115 pg/mL — ABNORMAL HIGH (ref 0.0–100.0)

## 2015-01-31 LAB — GAMMA GT: GGT: 114 U/L — ABNORMAL HIGH (ref 7–51)

## 2015-01-31 MED ORDER — AZITHROMYCIN 250 MG PO TABS: ORAL_TABLET | ORAL | Status: DC

## 2015-01-31 MED ORDER — BENZONATATE 100 MG PO CAPS: 100.0000 mg | ORAL_CAPSULE | Freq: Three times a day (TID) | ORAL | Status: DC | PRN

## 2015-01-31 MED ORDER — GABAPENTIN 100 MG PO CAPS: 100.0000 mg | ORAL_CAPSULE | Freq: Every day | ORAL | Status: DC

## 2015-01-31 MED ORDER — ALBUTEROL SULFATE HFA 108 (90 BASE) MCG/ACT IN AERS: 2.0000 | INHALATION_SPRAY | Freq: Four times a day (QID) | RESPIRATORY_TRACT | Status: DC | PRN

## 2015-01-31 MED ORDER — IPRATROPIUM BROMIDE HFA 17 MCG/ACT IN AERS: 2.0000 | INHALATION_SPRAY | Freq: Four times a day (QID) | RESPIRATORY_TRACT | Status: AC | PRN

## 2015-01-31 MED ORDER — OMEPRAZOLE 20 MG PO CPDR: 20.0000 mg | DELAYED_RELEASE_CAPSULE | Freq: Every day | ORAL | Status: DC

## 2015-01-31 NOTE — Assessment & Plan Note (Signed)
Hx of possible gi bleed. Get stat cbc. Refer back to gi asap.

## 2015-01-31 NOTE — Patient Instructions (Addendum)
For bronchits. Rx zpack antibiotic and benzonatate.  For wheezing and copd atrovent inhaler and albuterol  For your stomach symptom which I think is severe gerd. Rx omeprazole and reglan.  Please get cxr today, labs and lower ext dopplers. Stay in xray until I am called on doppler studies.  Will refer to Gi  Follow up in 1 week or as needed. Schedule 30 minute appointment.

## 2015-01-31 NOTE — Progress Notes (Signed)
Pre visit review using our clinic review tool, if applicable. No additional management support is needed unless otherwise documented below in the visit note. 

## 2015-01-31 NOTE — Telephone Encounter (Signed)
Per imaging patient negative for DVT,Bilateral soft tissure edema,Bakers' Cyst behind Left knee. Per E. Saguier ok for patient to leave will call in medication for nerve pain to pharmacy.

## 2015-01-31 NOTE — Assessment & Plan Note (Signed)
For wheezing and copd atrovent inhaler and albuterol

## 2015-01-31 NOTE — Assessment & Plan Note (Signed)
For your stomach symptom which I think is severe gerd. Rx omeprazole and reglan.

## 2015-01-31 NOTE — Assessment & Plan Note (Addendum)
Burning sensation. Doppler studies negative. Can't rx nsaids due to gi hx. Will rx gabapentin. Possible neuropathic pain.

## 2015-01-31 NOTE — Progress Notes (Signed)
Subjective:    Patient ID: Chad Sparks, male    DOB: August 29, 1950, 64 y.o.   MRN: [id number]  HPI   Hx of copd- Dx about 10 yrs ago per pt. Pt is not any inhalers for breathing. Pt reports productive cough that is persistent at times and can't catch his breath.   Pt is smoking. Only 2 cigarettes since Sunday. In past pack a day since 64 yo.   Allergic rhintis- chronic runny nose and sneezing all year round.  History of anemia- Pt had transfusion in September. Pt had upper endoscopy but was unable to do colonosocopy. Pt states could not tolerate the prep.  History of acute renal failure and chronic kidney disease. Last CR was elevated.  History of alcohol abuse- This past Saturday drank 2 bottles of beer. He cut back major amount since discharged.  Before admitted in September was drinking 12 pack a day. States last 2 months dramatic decrease per his sister.   Also at times some severe pedal edema. Feet to knees. Sometimes to thigh. But no presently. Last time was one week ago.   Pt can't keep food down well. He regurgitates food. 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   Past Medical History  Diagnosis Date  . Hepatitis C   . COPD (chronic obstructive pulmonary disease) ([place])   . Heavy cigarette smoker   . Alcohol abuse   . Allergy   . Emphysema of lung ([place])   . Chronic kidney disease     Social History   Social History  . Marital Status: Single    Spouse Name: N/A  . Number of Children: N/A  . Years of Education: N/A   Occupational History  . Not on file.   Social History Main Topics  . Smoking status: Current Every Day Smoker -- 0.50 packs/day    Types: Cigarettes  . Smokeless tobacco: Not on file  . Alcohol Use: Yes     Comment:  2-4 beers a month.  . Drug Use: No  . Sexual Activity: Not on file   Other Topics Concern  . Not on file   Social History Narrative     Past Surgical History  Procedure Laterality Date  . Esophagogastroduodenoscopy (egd) with propofol N/A 10/15/2014    Procedure: ESOPHAGOGASTRODUODENOSCOPY (EGD) WITH PROPOFOL;  Surgeon: Wonda Horner, MD;  Location: [facility] ENDOSCOPY;  Service: Endoscopy;  Laterality: N/A;    Family History  Problem Relation Age of Onset  . Throat cancer Mother   . Cancer Mother   . Emphysema Father   . Cancer Father   . Brain cancer Brother     No Known Allergies  No current outpatient prescriptions on file prior to visit.   No current facility-administered medications on file prior to visit.    BP 110/70 mmHg  Pulse 98  Temp(Src) 97.9 F (36.6 C) (Oral)  Ht 6' 2.5" (1.892 m)  Wt 174 lb (78.926 kg)  BMI 22.05 kg/m2  SpO2 98%         Review of Systems See hpi    Objective:   Physical Exam  General Mental Status- Alert. General Appearance- Not in acute distress.  Frail appearance sitting in wheel chair(reports can ambulate but mild unstable gait) coughs during exam.  Skin General: Color- Normal Color. Moisture- Normal Moisture.  Neck Carotid Arteries- Normal color. Moisture- Normal Moisture. No carotid bruits. No  JVD.  Chest and Lung Exam Auscultation: Breath Sounds:-shallow respiration, even unlabored.  Cardiovascular Auscultation:Rythm- Regular. Murmurs & Other Heart Sounds:Auscultation of the heart reveals- No Murmurs.  Abdomen Inspection:-Inspeection Normal. Palpation/Percussion:Note:No mass. Palpation and Percussion of the abdomen reveal- Non Tender, Non Distended + BS, no rebound or guarding.    Neurologic Cranial Nerve exam:- CN III-XII intact(No nystagmus), symmetric smile. Drift Test:- No drift. Romberg Exam:- Negative.  Heal to Toe Gait exam:-Normal. Finger to Nose:- Normal/Intact Strength:- 5/5 equal and symmetric strength both upper and lower extremities.      Assessment & Plan:  For bronchits. Rx zpack antibiotic and benzonatate.  For wheezing  and copd atrovent inhaler and albuterol  For your stomach symptom which I think is severe gerd. Rx omeprazole and reglan.  Please get cxr today, labs and lower ext dopplers. Stay in xray until I am called on doppler studies.  Will refer to Gi  Follow up in 1 week or as needed. Schedule 30 minute appointment.

## 2015-02-01 ENCOUNTER — Telehealth: Payer: Self-pay | Admitting: Medical

## 2015-02-01 NOTE — Telephone Encounter (Signed)
Caller name: [name]  Relationship to patient: Sister  Can be reached: [phone]   Reason for call: Sister called and stated that patient was called yesterday with results and instructions but patient did not understand. Patients wants information given to his sister

## 2015-02-01 NOTE — Telephone Encounter (Signed)
Advised sister of results at patients request and a copy of the results will be mailed also.

## 2015-02-06 ENCOUNTER — Encounter: Payer: Self-pay | Admitting: Medical

## 2015-02-06 ENCOUNTER — Telehealth: Payer: Self-pay | Admitting: Medical

## 2015-02-06 ENCOUNTER — Ambulatory Visit (INDEPENDENT_AMBULATORY_CARE_PROVIDER_SITE_OTHER): Payer: Non-veteran care | Admitting: Medical

## 2015-02-06 VITALS — BP 106/64 | HR 68 | Temp 98.0°F | Ht 74.5 in | Wt 171.8 lb

## 2015-02-06 DIAGNOSIS — D649 Anemia, unspecified: Secondary | ICD-10-CM

## 2015-02-06 DIAGNOSIS — R112 Nausea with vomiting, unspecified: Secondary | ICD-10-CM | POA: Diagnosis not present

## 2015-02-06 DIAGNOSIS — R351 Nocturia: Secondary | ICD-10-CM

## 2015-02-06 DIAGNOSIS — J189 Pneumonia, unspecified organism: Secondary | ICD-10-CM

## 2015-02-06 DIAGNOSIS — Z1211 Encounter for screening for malignant neoplasm of colon: Secondary | ICD-10-CM

## 2015-02-06 DIAGNOSIS — M79606 Pain in leg, unspecified: Secondary | ICD-10-CM

## 2015-02-06 DIAGNOSIS — Z23 Encounter for immunization: Secondary | ICD-10-CM | POA: Diagnosis not present

## 2015-02-06 DIAGNOSIS — R1110 Vomiting, unspecified: Principal | ICD-10-CM

## 2015-02-06 DIAGNOSIS — R319 Hematuria, unspecified: Secondary | ICD-10-CM

## 2015-02-06 DIAGNOSIS — J984 Other disorders of lung: Secondary | ICD-10-CM

## 2015-02-06 DIAGNOSIS — K219 Gastro-esophageal reflux disease without esophagitis: Secondary | ICD-10-CM | POA: Diagnosis not present

## 2015-02-06 DIAGNOSIS — Z125 Encounter for screening for malignant neoplasm of prostate: Secondary | ICD-10-CM

## 2015-02-06 DIAGNOSIS — IMO0001 Reserved for inherently not codable concepts without codable children: Secondary | ICD-10-CM

## 2015-02-06 LAB — POCT URINALYSIS DIPSTICK
BILIRUBIN UA: NEGATIVE
Glucose, UA: NEGATIVE
Ketones, UA: NEGATIVE
Leukocytes, UA: NEGATIVE
NITRITE UA: NEGATIVE
PH UA: 6
Protein, UA: NEGATIVE
SPEC GRAV UA: 1.025
UROBILINOGEN UA: 0.2

## 2015-02-06 LAB — PSA, MEDICARE: PSA: 0.35 ng/ml (ref 0.10–4.00)

## 2015-02-06 MED ORDER — CIPROFLOXACIN HCL 500 MG PO TABS: 500.0000 mg | ORAL_TABLET | Freq: Two times a day (BID) | ORAL | Status: DC

## 2015-02-06 MED ORDER — METOCLOPRAMIDE HCL 10 MG PO TABS: 10.0000 mg | ORAL_TABLET | Freq: Three times a day (TID) | ORAL | Status: DC

## 2015-02-06 MED ORDER — GABAPENTIN 100 MG PO CAPS: 100.0000 mg | ORAL_CAPSULE | Freq: Every day | ORAL | Status: AC

## 2015-02-06 NOTE — Progress Notes (Signed)
Pre visit review using our clinic review tool, if applicable. No additional management support is needed unless otherwise documented below in the visit note. 

## 2015-02-06 NOTE — Telephone Encounter (Signed)
Also hx of gi bleed in past by review old hospital records.

## 2015-02-06 NOTE — Telephone Encounter (Signed)
Referral has been sent to [facility] per referral notes

## 2015-02-06 NOTE — Telephone Encounter (Signed)
Caller name: °Relationship to patient: °Can be reached: °Pharmacy: ° °Reason for call: ° °

## 2015-02-06 NOTE — Telephone Encounter (Signed)
Please my result note regarding his psa value.

## 2015-02-06 NOTE — Progress Notes (Signed)
Subjective:    Patient ID: Chad Sparks, male    DOB: 1951-01-18, 64 y.o.   MRN: [id number]  HPI  Pt in today for follow up from last visit.  Pt states he only got one inhaler. Pt states the inhaler seemed to help. Hx of copd.  Pt had some bronchitis type symptoms and pneumonitis by cxr report. I rx zpack and benzonate. Cough is about the same pt able to sleep. Cough is not keeping him up. Not productive.  Pt has hx of possible gerd. He states 2 days ago he vomited 4 times but none yesterday. Since last visit he also had one day when vomited. Then he described almost immediatley after eating will regurgitate food. Over all he has done that since August.  Pt doppler were negative on review.  I did send in gabapentin. Gave for described possible nerve pain. Pt thinks he did not get. He thinks would cost $300??  Pt also reports he is urinated 4-5 times a night. He states this has gone for about 8 hours. He admits drinks beverages sometimes in evening. No burning on urination.     Review of Systems  Constitutional: Negative for fever, chills and fatigue.  Respiratory: Positive for cough. Negative for chest tightness.        Overall better breathing less occasional sob.  Cardiovascular: Negative for chest pain and palpitations.  Gastrointestinal: Negative for nausea and abdominal pain.       See hpi.  Genitourinary: Positive for frequency. Negative for urgency, flank pain, difficulty urinating, penile pain and testicular pain.  Musculoskeletal: Negative for back pain.  Skin: Negative for pallor.  Neurological: Negative for dizziness and headaches.  Hematological: Negative for adenopathy. Does not bruise/bleed easily.  Psychiatric/Behavioral: Negative for behavioral problems and confusion.     Past Medical History  Diagnosis Date  . Hepatitis C   . COPD (chronic obstructive pulmonary disease) ([place])   . Heavy cigarette smoker   . Alcohol abuse   . Allergy   . Emphysema of  lung ([place])   . Chronic kidney disease     Social History   Social History  . Marital Status: Single    Spouse Name: N/A  . Number of Children: N/A  . Years of Education: N/A   Occupational History  . Not on file.   Social History Main Topics  . Smoking status: Current Every Day Smoker -- 0.50 packs/day    Types: Cigarettes  . Smokeless tobacco: Not on file  . Alcohol Use: Yes     Comment:  2-4 beers a month.  . Drug Use: No  . Sexual Activity: Not on file   Other Topics Concern  . Not on file   Social History Narrative    Past Surgical History  Procedure Laterality Date  . Esophagogastroduodenoscopy (egd) with propofol N/A 10/15/2014    Procedure: ESOPHAGOGASTRODUODENOSCOPY (EGD) WITH PROPOFOL;  Surgeon: Wonda Horner, MD;  Location: [facility] ENDOSCOPY;  Service: Endoscopy;  Laterality: N/A;    Family History  Problem Relation Age of Onset  . Throat cancer Mother   . Cancer Mother   . Emphysema Father   . Cancer Father   . Brain cancer Brother     No Known Allergies  Current Outpatient Prescriptions on File Prior to Visit  Medication Sig Dispense Refill  . albuterol (PROVENTIL HFA;VENTOLIN HFA) 108 (90 BASE) MCG/ACT inhaler Inhale 2 puffs into the lungs every 6 (six) hours as needed for wheezing or shortness of  breath. 1 Inhaler 0  . gabapentin (NEURONTIN) 100 MG capsule Take 1 capsule (100 mg total) by mouth at bedtime. 30 capsule 0  . ibuprofen (ADVIL) 200 MG tablet Take 200 mg by mouth every 6 (six) hours as needed.    Marland Kitchen ipratropium (ATROVENT HFA) 17 MCG/ACT inhaler Inhale 2 puffs into the lungs every 6 (six) hours as needed for wheezing. 1 Inhaler 12  . omeprazole (PRILOSEC) 20 MG capsule Take 1 capsule (20 mg total) by mouth daily. 30 capsule 3   No current facility-administered medications on file prior to visit.    BP 106/64 mmHg  Pulse 68  Temp(Src) 98 F (36.7 C) (Oral)  Ht 6' 2.5" (1.892 m)  Wt 171 lb 12.8 oz (77.928 kg)  BMI 21.77 kg/m2  SpO2  98%     Objective:   Physical Exam  General Mental Status- Alert. General Appearance- Not in acute distress.  Frail appearance sitting in wheel chair  Skin General: Color- Normal Color. Moisture- Normal Moisture.    Chest and Lung Exam Auscultation: Breath Sounds:-shallow respiration, even unlabored.  Cardiovascular Auscultation:Rythm- Regular. Murmurs & Other Heart Sounds:Auscultation of the heart reveals- No Murmurs.  Abdomen Inspection:-Inspeection Normal. Palpation/Percussion:Note:No mass. Palpation and Percussion of the abdomen reveal- Non Tender, Non Distended + BS, no rebound or guarding.    Neurologic Cranial Nerve exam:- CN III-XII intact(No nystagmus), symmetric smile. Strength:- 5/5 equal and symmetric strength both upper and lower extremities  Rectal Anorectal Exam: Stool - Hemoccult of stool/mucous is Heme Negative. External - normal external exam. Internal - normal sphincter tone. No rectal mass. Prostate maybe faint enlared. Symmetric. No nodules. Not boggy.            Objective:   Physical Exam          Assessment & Plan:  For her pneumonitis I think maybe from hx of smoking. Your lungs sound clear today and you did take antibiotics. I don't think further needed unless you symptoms worsen. Continue inhalers.  For regurgitation and reflux, I did write reglan today and last visit rx omeprazole. I wrote referral to GI as well.  For leg pain which may be nerve pain, I rx'd gabapentin on last visit. Double check on price. I am surprised if this is one that cost $300.  For your frequent urination we will get ua dip and culture. Also get psa today. May rx med to help with urinary frequency when psa back.

## 2015-02-06 NOTE — Patient Instructions (Addendum)
For her pneumonitis by cxr  I think maybe from hx of smoking. Your lungs sound clear today and you did take antibiotics in event infection related. I don't think further antibiotic  needed unless you symptoms worsen. Continue inhalers.  For regurgitation and reflux, I did write reglan today and last visit rx omeprazole. I wrote referral to GI as well.  For leg pain which may be nerve pain, I rx'd gabapentin on last visit. Double check on price. I am surprised if this is one that cost $300.  For your frequent urination we will get ua dip and culture. Also get psa today. May rx med to help with urinary frequency when psa back.   We gave tdap and shingles vaccine today.  Follow up 2 wks or as needed.  You did have 1+ blood in urine. Will get culture. Depending on what shows may give antibiotic. On follow up will repeat urine. With urinary frequency and the blood. Will rx cipro for 7 days pending the culture.

## 2015-02-07 ENCOUNTER — Telehealth: Payer: Self-pay | Admitting: Medical

## 2015-02-07 LAB — URINE CULTURE: Colony Count: 3000

## 2015-02-07 MED ORDER — TAMSULOSIN HCL 0.4 MG PO CAPS: 0.4000 mg | ORAL_CAPSULE | Freq: Every day | ORAL | Status: DC

## 2015-02-07 NOTE — Telephone Encounter (Signed)
He is dribbling and voiding 5-6 times a night.

## 2015-02-07 NOTE — Telephone Encounter (Signed)
Sent in rx for his urinary symptoms. Notify pt.

## 2015-02-07 NOTE — Telephone Encounter (Signed)
Patient was notified of results and sister was notified of results as well.

## 2015-02-07 NOTE — Telephone Encounter (Signed)
Is he dribbling when urinating or having problem initiating flow? That will help me know what to rx.

## 2015-02-08 ENCOUNTER — Other Ambulatory Visit: Payer: Self-pay

## 2015-02-08 ENCOUNTER — Telehealth: Payer: Self-pay | Admitting: Medical

## 2015-02-08 MED ORDER — TAMSULOSIN HCL 0.4 MG PO CAPS: 0.4000 mg | ORAL_CAPSULE | Freq: Every day | ORAL | Status: DC

## 2015-02-08 NOTE — Telephone Encounter (Signed)
Patient notified

## 2015-02-08 NOTE — Telephone Encounter (Signed)
Caller name:Berrones,Vera Call back [place]   Reason for call:  Patient would like tamsulosin (FLOMAX) 0.4 MG CAPS capsule to go to [place] due to the cost.

## 2015-02-11 ENCOUNTER — Telehealth: Payer: Self-pay | Admitting: Medical

## 2015-02-11 MED ORDER — BENZONATATE 100 MG PO CAPS: 100.0000 mg | ORAL_CAPSULE | Freq: Three times a day (TID) | ORAL | Status: DC | PRN

## 2015-02-11 NOTE — Telephone Encounter (Signed)
Edward please see note below.

## 2015-02-11 NOTE — Telephone Encounter (Signed)
I did call pt benzonatate in for cough. But if having fever or other infectious symptoms  Have him come in for appointment.

## 2015-02-11 NOTE — Telephone Encounter (Signed)
Caller name: Self    Can be reached: [phone]  Pharmacy:  [facility] [place] [facility] [place] [phone] (Phone) [phone] (Fax)         Reason for call: Request refill on Tessalon/Benzonate. (Did not see on patients med list)

## 2015-02-12 NOTE — Telephone Encounter (Signed)
Spoke with patients sister and medication is ready for pickup and she voices understanding.

## 2015-02-20 ENCOUNTER — Telehealth: Payer: Self-pay | Admitting: Medical

## 2015-02-20 ENCOUNTER — Encounter: Payer: Self-pay | Admitting: Medical

## 2015-02-20 ENCOUNTER — Ambulatory Visit (HOSPITAL_BASED_OUTPATIENT_CLINIC_OR_DEPARTMENT_OTHER)
Admission: RE | Admit: 2015-02-20 | Discharge: 2015-02-20 | Disposition: A | Discharge status: Home / Self Care | Payer: Non-veteran care | Source: Ambulatory Visit | Attending: Medical | Admitting: Medical

## 2015-02-20 ENCOUNTER — Ambulatory Visit (INDEPENDENT_AMBULATORY_CARE_PROVIDER_SITE_OTHER): Payer: Non-veteran care | Admitting: Medical

## 2015-02-20 VITALS — BP 98/70 | HR 89 | Temp 98.1°F | Ht 74.5 in | Wt 168.4 lb

## 2015-02-20 DIAGNOSIS — M79606 Pain in leg, unspecified: Secondary | ICD-10-CM

## 2015-02-20 DIAGNOSIS — K219 Gastro-esophageal reflux disease without esophagitis: Secondary | ICD-10-CM | POA: Diagnosis not present

## 2015-02-20 DIAGNOSIS — R918 Other nonspecific abnormal finding of lung field: Secondary | ICD-10-CM | POA: Insufficient documentation

## 2015-02-20 DIAGNOSIS — R490 Dysphonia: Secondary | ICD-10-CM

## 2015-02-20 DIAGNOSIS — R61 Generalized hyperhidrosis: Secondary | ICD-10-CM

## 2015-02-20 DIAGNOSIS — R351 Nocturia: Secondary | ICD-10-CM | POA: Diagnosis not present

## 2015-02-20 DIAGNOSIS — R040 Epistaxis: Secondary | ICD-10-CM

## 2015-02-20 DIAGNOSIS — G629 Polyneuropathy, unspecified: Secondary | ICD-10-CM | POA: Diagnosis not present

## 2015-02-20 DIAGNOSIS — R05 Cough: Secondary | ICD-10-CM

## 2015-02-20 DIAGNOSIS — R059 Cough, unspecified: Secondary | ICD-10-CM

## 2015-02-20 LAB — CBC WITH DIFFERENTIAL/PLATELET
BASOS PCT: 0.7 % (ref 0.0–3.0)
Basophils Absolute: 0 10*3/uL (ref 0.0–0.1)
EOS PCT: 1.2 % (ref 0.0–5.0)
Eosinophils Absolute: 0 10*3/uL (ref 0.0–0.7)
LYMPHS PCT: 42.4 % (ref 12.0–46.0)
Lymphs Abs: 1.6 10*3/uL (ref 0.7–4.0)
MCHC: 31.6 g/dL (ref 30.0–36.0)
MCV: 80.2 fl (ref 78.0–100.0)
MONO ABS: 0.6 10*3/uL (ref 0.1–1.0)
MONOS PCT: 16.2 % — AB (ref 3.0–12.0)
Neutro Abs: 1.5 10*3/uL (ref 1.4–7.7)
Neutrophils Relative %: 39.5 % — ABNORMAL LOW (ref 43.0–77.0)
Platelets: 88 10*3/uL — ABNORMAL LOW (ref 150.0–400.0)
RBC: 3.35 Mil/uL — ABNORMAL LOW (ref 4.22–5.81)
RDW: 23.7 % — AB (ref 11.5–15.5)
WBC: 3.8 10*3/uL — AB (ref 4.0–10.5)

## 2015-02-20 MED ORDER — AZITHROMYCIN 250 MG PO TABS: ORAL_TABLET | ORAL | Status: DC

## 2015-02-20 MED ORDER — TAMSULOSIN HCL 0.4 MG PO CAPS: 0.4000 mg | ORAL_CAPSULE | Freq: Every day | ORAL | Status: AC

## 2015-02-20 MED ORDER — METOCLOPRAMIDE HCL 10 MG PO TABS: 10.0000 mg | ORAL_TABLET | Freq: Three times a day (TID) | ORAL | Status: AC

## 2015-02-20 NOTE — Progress Notes (Signed)
Pre visit review using our clinic review tool, if applicable. No additional management support is needed unless otherwise documented below in the visit note. 

## 2015-02-20 NOTE — Progress Notes (Signed)
   Subjective:    Patient ID: Chad Sparks, male    DOB: 05/22/1950, 64 y.o.   MRN: [id number]  HPI  Pt wants placard for disability parking filled out. Pt has difficulty walking due to leg pain and shortness of breath. He states he can't walk any significance distance due to leg pain and will get sob. Hx of smoking 50+ years.  Pt did get neurontin for his leg pain/possible nerve pain. Pt states less sharp pain is less with neurontin.  Pt states his stomach is also improved. He was regurgitating while eating almost  with every meal. But not now. Now only regurgitating one time every 3 days. Referral to GI is pending.  Pt also has hoarse voice for months for 8 months. Smoked since 64 years old. Not improving, Also transient blood tinged mucous when he blows. Usually on his rt side.    Pt had some frequent  Urination at night. Got psa and then placed him on flomax. He is urinating twice a night now. Was urinating 5 times a night.  Also end of exam states sweats today. He coughs chronically.      Review of Systems See hpi.     Objective:   Physical Exam General Mental Status- Alert. General Appearance- Not in acute distress. Frail appearance sitting in wheel chair  Skin General: Color- Normal Color. Moisture- Normal Moisture.    Chest and Lung Exam Auscultation: Breath Sounds:-shallow respiration, even unlabored.  Cardiovascular Auscultation:Rythm- Regular. Murmurs & Other Heart Sounds:Auscultation of the heart reveals- No Murmurs.  Abdomen Inspection:-Inspeection Normal. Palpation/Percussion:Note:No mass. Palpation and Percussion of the abdomen reveal- Non Tender, Non Distended + BS, no rebound or guarding.  Neurologic Cranial Nerve exam:- CN III-XII intact(No nystagmus), symmetric smile. Strength:- 5/5 equal and symmetric strength both upper and lower extremities  Legs- no pedal edema. Negative homans signs.       Assessment & Plan:  Neuropathy lower  extremity  improved. Continue neurontin.  Gerd improved. Continue reglan and prilosec but still see GI. Referral already placed.  Flomax improved frequent urination. Continue likely bph.  For hoarse voice x 8 months and  smoking history, refer to ENT.  For diaphoresis and cough get cxr today and rx azithromycin.  Follow up 1 months or as needed.    Filled out disability placard.

## 2015-02-20 NOTE — Telephone Encounter (Signed)
I saw your note. Can you call Dr. Penelope Coop office and explain situation that he still moderate to severe recent symptoms of regurgitation, abdomen pain and persisting worsening  anemia. Dr Penelope Coop did see him in the hospital.. If his office won't see pt quickly can  they send records to [facility] so he can be seen quickly. Would you coordinate with Pleas Koch and Anderson Malta on this referral.

## 2015-02-20 NOTE — Telephone Encounter (Signed)
Pt has anemia. Slightly worse compared to last. I put in referral to GI for evaluation for possible GI bleed.  He had tranfusion when hosptialized. Which GI have you tried to get him in with and what is expected date. He had egd but never had colonsocpy. Discharge summary states he has chronic ooze. Dr. Penelope Coop may have done EGD when he was in the hospital in September. He is new patient to me and I am gathering he should have had follow up with GI back in October. So what has GI said?

## 2015-02-20 NOTE — Patient Instructions (Addendum)
Neuropathy lower extremity  improved. Continue neurontin.  Gerd improved. Continue reglan and prilosec but still see GI. Referral already placed.  Flomax improved frequent urination. Continue likely bph.  For hoarse voice x 8 months and  smoking history, refer to ENT.  For diaphoresis and cough get cxr today and rx azithromycin.  Follow up 1 months or as needed.    Filled out disability placard.

## 2015-02-21 NOTE — Telephone Encounter (Signed)
Referral faxed to [facility] GI/awaiting appt

## 2015-03-27 ENCOUNTER — Ambulatory Visit: Payer: Non-veteran care | Admitting: Medical

## 2015-03-27 ENCOUNTER — Ambulatory Visit (INDEPENDENT_AMBULATORY_CARE_PROVIDER_SITE_OTHER): Payer: Non-veteran care | Admitting: Medical

## 2015-03-27 ENCOUNTER — Telehealth: Payer: Self-pay | Admitting: Medical

## 2015-03-27 ENCOUNTER — Encounter: Payer: Self-pay | Admitting: Medical

## 2015-03-27 VITALS — BP 104/68 | HR 81 | Temp 98.4°F | Ht 74.5 in | Wt 162.6 lb

## 2015-03-27 DIAGNOSIS — G629 Polyneuropathy, unspecified: Secondary | ICD-10-CM | POA: Diagnosis not present

## 2015-03-27 DIAGNOSIS — J441 Chronic obstructive pulmonary disease with (acute) exacerbation: Secondary | ICD-10-CM | POA: Diagnosis not present

## 2015-03-27 DIAGNOSIS — K219 Gastro-esophageal reflux disease without esophagitis: Secondary | ICD-10-CM

## 2015-03-27 DIAGNOSIS — D649 Anemia, unspecified: Secondary | ICD-10-CM | POA: Diagnosis not present

## 2015-03-27 DIAGNOSIS — R059 Cough, unspecified: Secondary | ICD-10-CM

## 2015-03-27 DIAGNOSIS — R05 Cough: Secondary | ICD-10-CM

## 2015-03-27 LAB — CBC WITH DIFFERENTIAL/PLATELET
BASOS PCT: 0 % (ref 0.0–3.0)
EOS PCT: 3 % (ref 0.0–5.0)
HCT: 31.1 % — ABNORMAL LOW (ref 39.0–52.0)
Hemoglobin: 9.7 g/dL — ABNORMAL LOW (ref 13.0–17.0)
LYMPHS PCT: 50 % — AB (ref 12.0–46.0)
MCHC: 31.3 g/dL (ref 30.0–36.0)
MCV: 83.7 fl (ref 78.0–100.0)
MONOS PCT: 24 % — AB (ref 3.0–12.0)
NEUTROS PCT: 23 % — AB (ref 43.0–77.0)
Platelets: 135 10*3/uL — ABNORMAL LOW (ref 150.0–400.0)
RBC: 3.72 Mil/uL — AB (ref 4.22–5.81)
RDW: 21.8 % — ABNORMAL HIGH (ref 11.5–15.5)
WBC: 4.5 10*3/uL (ref 4.0–10.5)

## 2015-03-27 MED ORDER — BECLOMETHASONE DIPROPIONATE 40 MCG/ACT IN AERS: 2.0000 | INHALATION_SPRAY | Freq: Two times a day (BID) | RESPIRATORY_TRACT | Status: DC

## 2015-03-27 NOTE — Progress Notes (Signed)
Pre visit review using our clinic review tool, if applicable. No additional management support is needed unless otherwise documented below in the visit note. 

## 2015-03-27 NOTE — Progress Notes (Signed)
Subjective:    Patient ID: Chad Sparks, male    DOB: 11/16/1950, 65 y.o.   MRN: [id number]  HPI  Pt in states he feel about the same. Still have some sob and tired feeling.  Pt does smoke. Smoker since teens. Now smokes about 1 pack a week.  When pt lays down will wheeze. Overall use of atrovent has improved his breathing.   Also still has leg pain. I had written him neurontin 100 mg to take at night.  No sedation reported. But on review he has not been taking.(since I wrote it to take at night he thought it was to make him sleep)  Pt has some anemia history. Recently improved. In hospital they were going to do colonoscopy but pt could not tolerate the prep.   [facility] He had been trying to see them but when he calls the [place] will get automated line. He waited and never got through.  Pt not reporting GI symptom presently but I have been trying to follow up on referral to GI after reviewing his hx/hospitalization history.   Pt had some suspected bph symptoms and I gave flomax. He is urinating better now.     Review of Systems  Constitutional: Negative for chills, diaphoresis and fatigue.  Respiratory: Positive for cough, shortness of breath and wheezing. Negative for chest tightness.        Rare cough  With activity mild sob Occasional wheeze still but better with inhalers.  Cardiovascular: Negative for chest pain and palpitations.  Gastrointestinal: Negative for nausea, vomiting, abdominal pain and constipation.  Musculoskeletal: Negative for back pain.  Neurological: Negative for dizziness.       Leg pains/neuropathy.  Hematological: Negative for adenopathy. Does not bruise/bleed easily.  Psychiatric/Behavioral: Negative for behavioral problems and confusion.   Past Medical History  Diagnosis Date  . Hepatitis C   . COPD (chronic obstructive pulmonary disease) ([place])   . Heavy cigarette smoker   . Alcohol abuse   . Allergy   .  Emphysema of lung ([place])   . Chronic kidney disease     Social History   Social History  . Marital Status: Single    Spouse Name: N/A  . Number of Children: N/A  . Years of Education: N/A   Occupational History  . Not on file.   Social History Main Topics  . Smoking status: Current Every Day Smoker -- 0.50 packs/day    Types: Cigarettes  . Smokeless tobacco: Not on file  . Alcohol Use: Yes     Comment:  2-4 beers a month.  . Drug Use: No  . Sexual Activity: Not on file   Other Topics Concern  . Not on file   Social History Narrative    Past Surgical History  Procedure Laterality Date  . Esophagogastroduodenoscopy (egd) with propofol N/A 10/15/2014    Procedure: ESOPHAGOGASTRODUODENOSCOPY (EGD) WITH PROPOFOL;  Surgeon: Wonda Horner, MD;  Location: [facility] ENDOSCOPY;  Service: Endoscopy;  Laterality: N/A;    Family History  Problem Relation Age of Onset  . Throat cancer Mother   . Cancer Mother   . Emphysema Father   . Cancer Father   . Brain cancer Brother     No Known Allergies  Current Outpatient Prescriptions on File Prior to Visit  Medication Sig Dispense Refill  . albuterol (PROVENTIL HFA;VENTOLIN HFA) 108 (90 BASE) MCG/ACT inhaler Inhale 2 puffs into the lungs every 6 (six) hours as needed  for wheezing or shortness of breath. 1 Inhaler 0  . gabapentin (NEURONTIN) 100 MG capsule Take 1 capsule (100 mg total) by mouth at bedtime. 30 capsule 0  . ibuprofen (ADVIL) 200 MG tablet Take 200 mg by mouth every 6 (six) hours as needed.    Marland Kitchen ipratropium (ATROVENT HFA) 17 MCG/ACT inhaler Inhale 2 puffs into the lungs every 6 (six) hours as needed for wheezing. 1 Inhaler 12  . metoCLOPramide (REGLAN) 10 MG tablet Take 1 tablet (10 mg total) by mouth 3 (three) times daily before meals. 30 tablet 0  . omeprazole (PRILOSEC) 20 MG capsule Take 1 capsule (20 mg total) by mouth daily. 30 capsule 3  . tamsulosin (FLOMAX) 0.4 MG CAPS capsule Take 1 capsule (0.4 mg total) by mouth  daily. 30 capsule 3   No current facility-administered medications on file prior to visit.    BP 104/68 mmHg  Pulse 81  Temp(Src) 98.4 F (36.9 C) (Oral)  Ht 6' 2.5" (1.892 m)  Wt 162 lb 9.6 oz (73.755 kg)  BMI 20.60 kg/m2  SpO2 98%       Objective:   Physical Exam  General Appearance- Not in acute distress.  HEENT Eyes- Scleraeral/Conjuntiva-bilat- Not Yellow. Mouth & Throat- Normal.  Chest and Lung Exam Auscultation: Breath sounds:-Normal. Adventitious sounds:- No Adventitious sounds. Shallow respiraton but clear.  Cardiovascular Auscultation:Rythm - Regular. Heart Sounds -Normal heart sounds.  Abdomen Inspection:-Inspection Normal.  Palpation/Perucssion: Palpation and Percussion of the abdomen reveal- Non Tender, No Rebound tenderness, No rigidity(Guarding) and No Palpable abdominal masses.  Liver:-Normal.  Spleen:- Normal.   Lower ext-  Bilaterally (thin. No edema. No pain on palpation. )      Assessment & Plan:  For copd and mild wheezing. Continue atrovent and albuterol. Will add qvar.  For leg pain/neuropathy. Please start neurontin at night for pain. Will see if helps and may write for day use in future.  For anemia get cbc today and ifob.  For Gerd pain/abd pain history and anemia will try again to refer/ ask our staff to call and see what is hold up on referral. [facility].  Follow up in 1 month or as needed

## 2015-03-27 NOTE — Telephone Encounter (Signed)
The patient has the [employer] and we cannot refer the patient.  He has to be referred by the [facility]

## 2015-03-27 NOTE — Telephone Encounter (Signed)
I had referred to GI.Will you update me on why they did not get him. Thanks.

## 2015-03-27 NOTE — Patient Instructions (Addendum)
For copd and mild wheezing. Continue atrovent and albuterol. Will add qvar.  For leg pain/neuropathy. Please start neurontin at night for pain. Will see if helps and may write for day use in future.  For anemia get cbc today and ifob.  For Gerd pain/abd pain history and anemia will try again to refer ask our staff to call and see what is hold up on referral. [facility]  Follow up in 1 month or as needed

## 2015-03-28 NOTE — Telephone Encounter (Signed)
Left message on Sister Marlene's cell with information and also left a message for other sister Vanita Ingles to call back with questions about the paperwork to help pt get financial assistance for his medications.

## 2015-04-02 NOTE — Telephone Encounter (Signed)
Will you fill out Pt disablility placard info. We need Dr. Etter Sjogren info. I am ok with her her stamp or signature. Ask Maudie Mercury if you don't know Dr. Etter Sjogren License number etc.

## 2015-04-03 NOTE — Telephone Encounter (Signed)
Information filled out and updated. Will get Dr. Etter Sjogren signature when she is back in office on Thursday.

## 2015-04-04 NOTE — Telephone Encounter (Signed)
Pt was notified that form was ready for pick-up at front office. I also left message with emergency contact sister per pt request also.

## 2015-04-05 ENCOUNTER — Other Ambulatory Visit (INDEPENDENT_AMBULATORY_CARE_PROVIDER_SITE_OTHER): Payer: Non-veteran care

## 2015-04-05 DIAGNOSIS — D649 Anemia, unspecified: Secondary | ICD-10-CM

## 2015-04-05 DIAGNOSIS — K219 Gastro-esophageal reflux disease without esophagitis: Secondary | ICD-10-CM | POA: Diagnosis not present

## 2015-04-05 LAB — FECAL OCCULT BLOOD, IMMUNOCHEMICAL: Fecal Occult Bld: NEGATIVE

## 2015-04-25 ENCOUNTER — Ambulatory Visit (INDEPENDENT_AMBULATORY_CARE_PROVIDER_SITE_OTHER): Payer: Non-veteran care | Admitting: Medical

## 2015-04-25 ENCOUNTER — Encounter: Payer: Self-pay | Admitting: Medical

## 2015-04-25 VITALS — BP 104/68 | HR 78 | Temp 98.0°F | Ht 75.0 in | Wt 159.6 lb

## 2015-04-25 DIAGNOSIS — G629 Polyneuropathy, unspecified: Secondary | ICD-10-CM

## 2015-04-25 DIAGNOSIS — K219 Gastro-esophageal reflux disease without esophagitis: Secondary | ICD-10-CM | POA: Diagnosis not present

## 2015-04-25 DIAGNOSIS — R05 Cough: Secondary | ICD-10-CM

## 2015-04-25 DIAGNOSIS — D649 Anemia, unspecified: Secondary | ICD-10-CM

## 2015-04-25 DIAGNOSIS — R059 Cough, unspecified: Secondary | ICD-10-CM

## 2015-04-25 DIAGNOSIS — F17200 Nicotine dependence, unspecified, uncomplicated: Secondary | ICD-10-CM

## 2015-04-25 DIAGNOSIS — Z720 Tobacco use: Secondary | ICD-10-CM

## 2015-04-25 LAB — CBC WITH DIFFERENTIAL/PLATELET
BASOS ABS: 0 10*3/uL (ref 0.0–0.1)
Basophils Relative: 0.5 % (ref 0.0–3.0)
Eosinophils Absolute: 0.1 10*3/uL (ref 0.0–0.7)
Eosinophils Relative: 2.4 % (ref 0.0–5.0)
HCT: 32.3 % — ABNORMAL LOW (ref 39.0–52.0)
Hemoglobin: 10.5 g/dL — ABNORMAL LOW (ref 13.0–17.0)
LYMPHS ABS: 2 10*3/uL (ref 0.7–4.0)
Lymphocytes Relative: 41.8 % (ref 12.0–46.0)
MCHC: 32.5 g/dL (ref 30.0–36.0)
MCV: 82.5 fl (ref 78.0–100.0)
MONO ABS: 0.8 10*3/uL (ref 0.1–1.0)
Monocytes Relative: 17.2 % — ABNORMAL HIGH (ref 3.0–12.0)
NEUTROS PCT: 38.1 % — AB (ref 43.0–77.0)
Neutro Abs: 1.8 10*3/uL (ref 1.4–7.7)
Platelets: 112 10*3/uL — ABNORMAL LOW (ref 150.0–400.0)
RBC: 3.91 Mil/uL — AB (ref 4.22–5.81)
RDW: 21 % — ABNORMAL HIGH (ref 11.5–15.5)
WBC: 4.8 10*3/uL (ref 4.0–10.5)

## 2015-04-25 MED ORDER — ALBUTEROL SULFATE HFA 108 (90 BASE) MCG/ACT IN AERS: 2.0000 | INHALATION_SPRAY | Freq: Four times a day (QID) | RESPIRATORY_TRACT | Status: AC | PRN

## 2015-04-25 MED ORDER — FLUTICASONE PROPIONATE HFA 110 MCG/ACT IN AERO: 2.0000 | INHALATION_SPRAY | Freq: Two times a day (BID) | RESPIRATORY_TRACT | Status: AC

## 2015-04-25 NOTE — Progress Notes (Signed)
Pre visit review using our clinic review tool, if applicable. No additional management support is needed unless otherwise documented below in the visit note. 

## 2015-04-25 NOTE — Progress Notes (Signed)
Subjective:    Patient ID: Chad Sparks, male    DOB: 1950-07-06, 65 y.o.   MRN: [id number]  HPI  Pt in for follow up.  Pt states his legs are not hurting any more after taking the neurontin.  Pt breathing is stable. No severe wheezing.Pt was on atrovent and albuterol in past. I tried to add qvar but cost was over $100.  Cough has been for 3-4 months. cough is most at night. X-ray 02-20-2015. Radiologist mentioned getting CT would be helpful to exclude malignancy.  Cough not productive. No fevers, no chills, or sweats.   Pt has history of anemia. His last stool test was negative for blood. His last hb/hct had improved. We attempted to refer [facility]. But [facility]. But when pt calls he has never been able to get through. IN hospsital he was being worked up for GI bleed in past. In hospital. He never had work up because he could not tolerate the prep.  History of Hoarse voice. I referred to ENT. But same situation applies that [place] needs to refer.  Pt gerd is stable. Stomach feels good now.     Review of Systems  Constitutional: Negative for fever, chills and fatigue.  HENT: Negative for congestion, postnasal drip, rhinorrhea, sinus pressure and sneezing.   Respiratory: Positive for cough. Negative for choking, shortness of breath and wheezing.   Cardiovascular: Negative for chest pain and palpitations.  Gastrointestinal: Negative for nausea, abdominal pain, diarrhea, constipation and blood in stool.  Genitourinary: Negative for dysuria, frequency and flank pain.  Musculoskeletal: Negative for back pain.  Skin: Negative for rash.  Neurological: Negative for dizziness and headaches.  Hematological: Negative for adenopathy. Does not bruise/bleed easily.  Psychiatric/Behavioral: Negative for behavioral problems and confusion.    Past Medical History  Diagnosis Date  . Hepatitis C   . COPD (chronic obstructive pulmonary disease) ([place])   . Heavy cigarette smoker   .  Alcohol abuse   . Allergy   . Emphysema of lung ([place])   . Chronic kidney disease     Social History   Social History  . Marital Status: Single    Spouse Name: N/A  . Number of Children: N/A  . Years of Education: N/A   Occupational History  . Not on file.   Social History Main Topics  . Smoking status: Current Every Day Smoker -- 0.50 packs/day    Types: Cigarettes  . Smokeless tobacco: Not on file  . Alcohol Use: Yes     Comment:  2-4 beers a month.  . Drug Use: No  . Sexual Activity: Not on file   Other Topics Concern  . Not on file   Social History Narrative    Past Surgical History  Procedure Laterality Date  . Esophagogastroduodenoscopy (egd) with propofol N/A 10/15/2014    Procedure: ESOPHAGOGASTRODUODENOSCOPY (EGD) WITH PROPOFOL;  Surgeon: Wonda Horner, MD;  Location: [facility] ENDOSCOPY;  Service: Endoscopy;  Laterality: N/A;    Family History  Problem Relation Age of Onset  . Throat cancer Mother   . Cancer Mother   . Emphysema Father   . Cancer Father   . Brain cancer Brother     No Known Allergies  Current Outpatient Prescriptions on File Prior to Visit  Medication Sig Dispense Refill  . albuterol (PROVENTIL HFA;VENTOLIN HFA) 108 (90 BASE) MCG/ACT inhaler Inhale 2 puffs into the lungs every 6 (six) hours as needed for wheezing or shortness of  breath. 1 Inhaler 0  . beclomethasone (QVAR) 40 MCG/ACT inhaler Inhale 2 puffs into the lungs 2 (two) times daily at 10 AM and 5 PM. 1 Inhaler 1  . gabapentin (NEURONTIN) 100 MG capsule Take 1 capsule (100 mg total) by mouth at bedtime. 30 capsule 0  . ibuprofen (ADVIL) 200 MG tablet Take 200 mg by mouth every 6 (six) hours as needed.    Marland Kitchen ipratropium (ATROVENT HFA) 17 MCG/ACT inhaler Inhale 2 puffs into the lungs every 6 (six) hours as needed for wheezing. 1 Inhaler 12  . metoCLOPramide (REGLAN) 10 MG tablet Take 1 tablet (10 mg total) by mouth 3 (three) times daily before meals. 30 tablet 0  . omeprazole (PRILOSEC)  20 MG capsule Take 1 capsule (20 mg total) by mouth daily. 30 capsule 3  . tamsulosin (FLOMAX) 0.4 MG CAPS capsule Take 1 capsule (0.4 mg total) by mouth daily. 30 capsule 3   No current facility-administered medications on file prior to visit.    BP 104/68 mmHg  Pulse 78  Temp(Src) 98 F (36.7 C) (Oral)  Ht '6\' 3"'$  (1.905 m)  Wt 159 lb 9.6 oz (72.394 kg)  BMI 19.95 kg/m2  SpO2 97%       Objective:   Physical Exam   General Mental Status- Alert. General Appearance- Not in acute distress.   Skin General: Color- Normal Color. Moisture- Normal Moisture.  Neck Carotid Arteries- Normal color. Moisture- Normal Moisture. No carotid bruits. No JVD.  Chest and Lung Exam Auscultation: Breath Sounds:-Normal.  Cardiovascular Auscultation:Rythm- Regular. Murmurs & Other Heart Sounds:Auscultation of the heart reveals- No Murmurs.  Abdomen Inspection:-Inspeection Normal. Palpation/Percussion:Note:No mass. Palpation and Percussion of the abdomen reveal- Non Tender, Non Distended + BS, no rebound or guarding.    Neurologic Cranial Nerve exam:- CN III-XII intact(No nystagmus), symmetric smile. Strength:- 5/5 equal and symmetric strength both upper and lower extremities.     Assessment & Plan:  For leg pain/neuropathy continue neurontin since well controlled.  For copd will add flovent and see if less expensive than qvar. Continue atrovent and albuterol.  For anemia will recheck.(again [facility] or [facility] appointment since they can only refer you.)  For hoarse voice contact [place] for referral. Since I tried and this is what that told our office.  For cough for months and history of smoking 50+ years will order ct of chest.  Your gerd/heartburn is stable. Continue omeprazole.  Follow up in 2 months or as needed

## 2015-04-25 NOTE — Patient Instructions (Addendum)
For leg pain/neuropathy continue neurontin since well controlled.  For copd will add flovent and see if less expensive than qvar. Continue atrovent and albuterol.  For anemia will recheck.(again [facility] or actually schedule appointment since they can only refer you.)  For hoarse voice contact [place] for referral. Since I tried and this is what that told our office.  For cough for months and history of smoking 50+ years will order ct of chest.  Your gerd/heartburn is stable. Continue omeprazole.  Follow up in 2 months or as needed

## 2015-04-30 ENCOUNTER — Telehealth: Payer: Self-pay | Admitting: Medical

## 2015-05-01 NOTE — Telephone Encounter (Signed)
Opened for review before discharge letter sent.

## 2015-05-15 ENCOUNTER — Telehealth: Payer: Self-pay | Admitting: Medical

## 2015-05-15 NOTE — Telephone Encounter (Signed)
Patient dismissed from [facility] by [facility] , effective April 30, 2015. Dismissal letter sent out by certified / registered mail.  [facility]  Certified dismissal letter returned as undeliverable, unclaimed, return to sender after three attempts by [place]. Letter placed in another envelope and resent as 1st class mail which does not require a signature.05/27/15 DAJ

## 2015-06-28 ENCOUNTER — Ambulatory Visit: Payer: Non-veteran care | Admitting: Medical

## 2016-02-21 ENCOUNTER — Encounter (HOSPITAL_COMMUNITY): Payer: Self-pay | Admitting: Family Medicine

## 2016-02-21 ENCOUNTER — Ambulatory Visit (HOSPITAL_COMMUNITY)
Admission: EM | Admit: 2016-02-21 | Discharge: 2016-02-21 | Disposition: A | Discharge status: Home / Self Care | Payer: Non-veteran care | Attending: Family Medicine | Admitting: Family Medicine

## 2016-02-21 DIAGNOSIS — R1111 Vomiting without nausea: Secondary | ICD-10-CM | POA: Diagnosis not present

## 2016-02-21 NOTE — Discharge Instructions (Signed)
Go to [facility] for further eval tonight.

## 2016-02-21 NOTE — ED Triage Notes (Signed)
Pt here for vomiting x 3 today and sts is clear. sts also he has had a cough for a while. sts that the coughing makes him vomit.

## 2016-02-21 NOTE — ED Provider Notes (Signed)
[place]    CSN: [id number] Arrival date & time: 02/21/16  1608     History   Chief Complaint Chief Complaint  Patient presents with  . Emesis  . Cough    HPI Chad Sparks is a 65 y.o. male.   The history is provided by the patient and a relative.  Emesis  Severity:  Mild Duration:  1 day Timing:  Sporadic (assoc only with cough, clear fluid.) Progression:  Unchanged Chronicity:  New (has a chronic cough but vomiting started today.) Relieved by:  None tried Associated symptoms: cough   Cough    Past Medical History:  Diagnosis Date  . Alcohol abuse   . Allergy   . Chronic kidney disease   . COPD (chronic obstructive pulmonary disease) ([place])   . Emphysema of lung ([place])   . Heavy cigarette smoker   . Hepatitis C     Patient Active Problem List   Diagnosis Date Noted  . GERD (gastroesophageal reflux disease) 01/31/2015  . Lower extremity pain 01/31/2015  . Anemia 01/31/2015  . Symptomatic anemia 11/26/2014  . CKD (chronic kidney disease) 11/26/2014  . Pedal edema 11/26/2014  . GIB (gastrointestinal bleeding) 10/15/2014  . AKI (acute kidney injury) ([place]) 10/15/2014  . Pancytopenia ([place]) 10/15/2014  . Hepatitis C   . COPD (chronic obstructive pulmonary disease) ([place])   . Heavy cigarette smoker   . Alcohol abuse   . Acute hepatitis C virus infection without hepatic coma   . Acute upper GI bleed     Past Surgical History:  Procedure Laterality Date  . ESOPHAGOGASTRODUODENOSCOPY (EGD) WITH PROPOFOL N/A 10/15/2014   Procedure: ESOPHAGOGASTRODUODENOSCOPY (EGD) WITH PROPOFOL;  Surgeon: Wonda Horner, MD;  Location: [facility] ENDOSCOPY;  Service: Endoscopy;  Laterality: N/A;       Home Medications    Prior to Admission medications   Medication Sig Start Date End Date Taking? Authorizing Provider  albuterol (PROVENTIL HFA;VENTOLIN HFA) 108 (90 Base) MCG/ACT inhaler Inhale 2 puffs into the lungs every 6 (six) hours as needed for wheezing or  shortness of breath. 04/25/15   Percell Miller Saguier, PA-C  fluticasone (FLOVENT HFA) 110 MCG/ACT inhaler Inhale 2 puffs into the lungs 2 (two) times daily. 04/25/15   Percell Miller Saguier, PA-C  gabapentin (NEURONTIN) 100 MG capsule Take 1 capsule (100 mg total) by mouth at bedtime. 02/06/15   Percell Miller Saguier, PA-C  ibuprofen (ADVIL) 200 MG tablet Take 200 mg by mouth every 6 (six) hours as needed.    Historical Provider, MD  ipratropium (ATROVENT HFA) 17 MCG/ACT inhaler Inhale 2 puffs into the lungs every 6 (six) hours as needed for wheezing. 01/31/15   Percell Miller Saguier, PA-C  metoCLOPramide (REGLAN) 10 MG tablet Take 1 tablet (10 mg total) by mouth 3 (three) times daily before meals. 02/20/15   Percell Miller Saguier, PA-C  omeprazole (PRILOSEC) 20 MG capsule Take 1 capsule (20 mg total) by mouth daily. 01/31/15   Percell Miller Saguier, PA-C  tamsulosin (FLOMAX) 0.4 MG CAPS capsule Take 1 capsule (0.4 mg total) by mouth daily. 02/20/15   Mackie Pai, PA-C    Family History Family History  Problem Relation Age of Onset  . Throat cancer Mother   . Cancer Mother   . Emphysema Father   . Cancer Father   . Chad cancer Brother     Social History Social History  Substance Use Topics  . Smoking status: Current Every Day Smoker    Packs/day: 0.50    Types: Cigarettes  .  Smokeless tobacco: Never Used  . Alcohol use Yes     Comment:  2-4 beers a month.     Allergies   Patient has no known allergies.   Review of Systems Review of Systems  Constitutional: Negative.   Respiratory: Positive for cough.   Gastrointestinal: Positive for vomiting.     Physical Exam Triage Vital Signs ED Triage Vitals [02/21/16 1624]  Enc Vitals Group     BP 143/89     Pulse Rate 85     Resp 18     Temp 97.7 F (36.5 C)     Temp Source Oral     SpO2 100 %     Weight      Height      Head Circumference      Peak Flow      Pain Score      Pain Loc      Pain Edu?      Excl. in [place]?    No data found.   Updated Vital  Signs BP 143/89   Pulse 85   Temp 97.7 F (36.5 C) (Oral)   Resp 18   SpO2 100%   Visual Acuity Right Eye Distance:   Left Eye Distance:   Bilateral Distance:    Right Eye Near:   Left Eye Near:    Bilateral Near:     Physical Exam  Constitutional: He is oriented to person, place, and time. Vital signs are normal. He appears cachectic. He has a sickly appearance.  Eyes: EOM are normal. Pupils are equal, round, and reactive to light.  Neurological: He is alert and oriented to person, place, and time.  Skin: Skin is warm and dry.  Nursing note and vitals reviewed.    UC Treatments / Results  Labs (all labs ordered are listed, but only abnormal results are displayed) Labs Reviewed - No data to display  EKG  EKG Interpretation None       Radiology No results found.  Procedures Procedures (including critical care time)  Medications Ordered in UC Medications - No data to display   Initial Impression / Assessment and Plan / UC Course  I have reviewed the triage vital signs and the nursing notes.  Pertinent labs & imaging results that were available during my care of the patient were reviewed by me and considered in my medical decision making (see chart for details).  Clinical Course     Sent to [facility], pt agrees,  Final Clinical Impressions(s) / UC Diagnoses   Final diagnoses:  Intractable vomiting without nausea, unspecified vomiting type    New Prescriptions Discharge Medication List as of 02/21/2016  5:35 PM       Billy Fischer, MD 02/21/16 1743

## 2016-03-27 ENCOUNTER — Other Ambulatory Visit: Payer: Self-pay | Admitting: Pulmonary Disease

## 2016-03-27 DIAGNOSIS — R918 Other nonspecific abnormal finding of lung field: Secondary | ICD-10-CM

## 2016-03-30 ENCOUNTER — Ambulatory Visit: Payer: Non-veteran care

## 2016-04-01 ENCOUNTER — Ambulatory Visit
Admission: RE | Admit: 2016-04-01 | Discharge: 2016-04-01 | Disposition: A | Discharge status: Home / Self Care | Payer: Non-veteran care | Source: Ambulatory Visit | Attending: Pulmonary Disease | Admitting: Pulmonary Disease

## 2016-04-01 DIAGNOSIS — I708 Atherosclerosis of other arteries: Secondary | ICD-10-CM | POA: Diagnosis not present

## 2016-04-01 DIAGNOSIS — I700 Atherosclerosis of aorta: Secondary | ICD-10-CM | POA: Insufficient documentation

## 2016-04-01 DIAGNOSIS — J9811 Atelectasis: Secondary | ICD-10-CM | POA: Insufficient documentation

## 2016-04-01 DIAGNOSIS — R918 Other nonspecific abnormal finding of lung field: Secondary | ICD-10-CM | POA: Diagnosis present

## 2016-04-01 DIAGNOSIS — R188 Other ascites: Secondary | ICD-10-CM | POA: Insufficient documentation

## 2016-04-01 DIAGNOSIS — M47892 Other spondylosis, cervical region: Secondary | ICD-10-CM | POA: Diagnosis not present

## 2016-04-01 DIAGNOSIS — J90 Pleural effusion, not elsewhere classified: Secondary | ICD-10-CM | POA: Diagnosis not present

## 2016-04-01 LAB — GLUCOSE, CAPILLARY: GLUCOSE-CAPILLARY: 75 mg/dL (ref 65–99)

## 2016-04-01 MED ORDER — FLUDEOXYGLUCOSE F - 18 (FDG) INJECTION
12.0000 | Freq: Once | INTRAVENOUS | Status: AC | PRN
  Administered 2016-04-01: 12.65 via INTRAVENOUS

## 2016-05-12 ENCOUNTER — Encounter: Payer: Self-pay | Admitting: Radiation Oncology

## 2016-05-21 ENCOUNTER — Encounter: Payer: Self-pay | Admitting: Radiation Oncology

## 2016-05-21 NOTE — Progress Notes (Signed)
Thoracic Location of Tumor / Histology: 5 cm Neoplasm of RLL lung with enhancing subcarinal lymph nodes  Patient presented with a chronic, non productive cough for the past 4-5 months.     Tobacco/Marijuana/Snuff/ETOH use: Quit 01/08/16 but resumed smoking. Reports 1 pack of cigarette will last him three days.   Past/Anticipated interventions by cardiothoracic surgery, if any: no  Past/Anticipated interventions by medical oncology, if any: no  Signs/Symptoms  Weight changes, if any: Reports his healthy weight was 205 lb. Patient weighs 153 lb today.   Respiratory complaints, if any: Reports a chronic non productive cough and SOB with mild exertion. Denies chest pain or difficulty swallowing.   Hemoptysis, if any: no  Pain issues, if any:  Calves hurt when he walks. He has fallen several times while in the shower but, hasn't sustained any injuries. Reports his feet are swollen and his calves feel hard. Reports these symptoms have persisted for over a year.   SAFETY ISSUES:  Prior radiation? no  Pacemaker/ICD? no   Possible current pregnancy?no  Is the patient on methotrexate? no  Current Complaints / other details:  66 year old male. Reports he was exposed to agent orange. Accompanied today by his cousin. Reports he has been unable to see from his right eye x 4 month. Denies headaches, dizziness, nausea or vomiting.

## 2016-05-22 NOTE — Progress Notes (Signed)
Radiation Oncology         [phone]) [phone] ________________________________  Initial outpatient Consultation  Name: Chad Sparks. 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 Primary cancer of right lower lobe of lung (HCC) 162.5 C34.31     HISTORY OF PRESENT ILLNESS: Chad Sparks. is a 66 y.o. male seen at the request of Dr. Gwenette Greet at the [facility] for a putative diagnosis of lung cancer. The patient was found to have a cough and 20 pounds of weight loss in November 2017, he was then found to have a 5 cm mass in the right lower lobe on CT scan in December 2017. He has a history of cirrhosis and has ascites and esophageal varices as well as a 6 mm density at the pancreatic head. He has undergone a bronchoscopy with EBUS in January 2018 revealing mild narrowing of the medial basilar segment of the right lower lobe no endobronchial lesions identified and no significant adenopathy by ultrasound, brushings and biopsy were nondiagnostic. A PET scan on 04/01/2016 revealed hypermetabolic mass within the right lower lobe with an SUV of 6, faint right hilar activity without discrete adenopathy, and a new right effusion with low-grade hypermetabolic activity felt to be consistent with history of cirrhosis. 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 Pleural fluid seen on PET with unable to be aspirated successfully due to lack of fluid at the time of thoracentesis. 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 He comes today to discuss options for  radiotherapy.      PREVIOUS RADIATION THERAPY: No  PAST MEDICAL HISTORY:  Past Medical History:  Diagnosis Date  . Alcohol abuse   . Allergy   . Chronic kidney disease   . COPD (chronic obstructive pulmonary disease) ([place])   . Emphysema of lung ([place])   . Heavy cigarette smoker   . Hepatitis C   . Lung cancer ([place])       PAST SURGICAL HISTORY: Past Surgical History:  Procedure Laterality Date  . ESOPHAGOGASTRODUODENOSCOPY (EGD) WITH PROPOFOL N/A 10/15/2014   Procedure: ESOPHAGOGASTRODUODENOSCOPY (EGD) WITH PROPOFOL;  Surgeon: Wonda Horner, MD;  Location: [facility] ENDOSCOPY;  Service: Endoscopy;  Laterality: N/A;    FAMILY HISTORY:  Family History  Problem Relation Age of Onset  . Throat cancer Mother   . Cancer Mother     throat  . Emphysema Father   . Brain cancer Brother   . Cancer Brother     brain  . Cancer Paternal Grandmother     leukemia    SOCIAL HISTORY:  Social History   Social History  . Marital status: Single    Spouse name: N/A  . Number of children: N/A  . Years of education: N/A   Occupational History  . Not on file.   Social History Main Topics  . Smoking status: Current Every Day Smoker    Packs/day: 0.50    Years: 53.00    Types: Cigarettes  . Smokeless tobacco: Never Used  . Alcohol use Yes     Comment:  2-4 beers a  month.  . Drug use: No  . Sexual activity: No   Other Topics Concern  . Not on file   Social History Narrative  . 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 albuterol (PROVENTIL HFA;VENTOLIN HFA) 108 (90 Base) MCG/ACT inhaler Inhale 2 puffs into the lungs every 6 (six) hours as needed for wheezing or shortness of breath. (Patient not taking: Reported on 05/25/2016) 1 Inhaler 3  . fluticasone (FLOVENT HFA) 110 MCG/ACT inhaler Inhale 2 puffs into the lungs 2 (two) times daily. (Patient not taking: Reported on 05/25/2016)  1 Inhaler 3  . gabapentin (NEURONTIN) 100 MG capsule Take 1 capsule (100 mg total) by mouth at bedtime. (Patient not taking: Reported on 05/25/2016) 30 capsule 0  . ibuprofen (ADVIL) 200 MG tablet Take 200 mg by mouth every 6 (six) hours as needed.    Marland Kitchen ipratropium (ATROVENT HFA) 17 MCG/ACT inhaler Inhale 2 puffs into the lungs every 6 (six) hours as needed for wheezing. (Patient not taking: Reported on 05/25/2016) 1 Inhaler 12  . metoCLOPramide (REGLAN) 10 MG tablet Take 1 tablet (10 mg total) by mouth 3 (three) times daily before meals. (Patient not taking: Reported on 05/25/2016) 30 tablet 0  . omeprazole (PRILOSEC) 20 MG capsule Take 1 capsule (20 mg total) by mouth daily. (Patient not taking: Reported on 05/25/2016) 30 capsule 3  . tamsulosin (FLOMAX) 0.4 MG CAPS capsule Take 1 capsule (0.4 mg total) by mouth daily. (Patient not taking: Reported on 05/25/2016) 30 capsule 3   No current facility-administered medications for this encounter.     REVIEW OF SYSTEMS:  On review of systems, the patient reports that he is doing well overall. He denies any chest pain, fevers, chills, night sweats. He reports a chronic non productive cough and SOB with mild exertion. His cough is not any worse in the morning or evening. He denies hemoptysis, or dysphagia. He reports his healthy weight was 205 lb and patient weighs 153 lb today. He reports his appetite varies, some days it is good and sometimes he doesn't eat for two to three days at a time. He denies any bowel or bladder disturbances, and denies abdominal pain, or nausea. He does report an episode of vomiting about three weeks ago with coughing. He reports calves hurt when he walks. He has fallen several times while in the shower but, hasn't sustained any injuries. He reports his feet are swollen and his calves feel hard, and reports these symptoms have persisted for over a year. He uses a rolling walker to ambulate secondary to these symptoms. He states his lower  left arm has been numb for the last three days including, all the fingers on his left hand. He states Emu spray resolves this. He reports pain in the left shoulder. He reports generalized weakness. He reports he has been unable to see from his right eye x 4 month. He denies headaches or dizziness. He denies arthritis in his neck, reporting only arthritis in his hands. A complete review of systems is obtained and is otherwise negative.    PHYSICAL EXAM:  Wt Readings from Last 3 Encounters:  05/25/16 153 lb 12.8 oz (69.8 kg)  04/25/15 159 lb 9.6 oz (72.4 kg)  03/27/15 162 lb 9.6 oz (73.8 kg)   Temp Readings from Last 3 Encounters:  05/25/16 98 F (36.7 C) (Oral)  02/21/16 97.7 F (36.5 C) (Oral)  04/25/15  98 F (36.7 C) (Oral)   BP Readings from Last 3 Encounters:  05/25/16 116/75  02/21/16 143/89  04/25/15 104/68   Pulse Readings from Last 3 Encounters:  05/25/16 (!) 103  02/21/16 85  04/25/15 78   Pain Assessment Pain Score: 0-No pain/10  In general this is a well appearing African-American male in no acute distress. He uses a rolling walker to ambulate. He is alert and oriented x4 and appropriate throughout the examination. HEENT reveals that the patient is normocephalic, atraumatic. EOMs are intact. PERRLA. Skin is intact without any evidence of gross lesions. Cardiovascular exam reveals a regular rate and rhythm, no clicks rubs or murmurs are auscultated. Chest is clear to auscultation bilaterally. Lymphatic assessment is performed and does not reveal any adenopathy in the cervical, supraclavicular, axillary, or inguinal chains. Abdomen has active bowel sounds in all quadrants and is intact. The abdomen is soft, non tender, non distended. Lower extremities are negative for pretibial pitting edema, deep calf tenderness, cyanosis or clubbing.   KPS = 90  100 - Normal; no complaints; no evidence of disease. 90   - Able to carry on normal activity; minor signs or symptoms of  disease. 80   - Normal activity with effort; some signs or symptoms of disease. 49   - Cares for self; unable to carry on normal activity or to do active work. 60   - Requires occasional assistance, but is able to care for most of his personal needs. 50   - Requires considerable assistance and frequent medical care. 23   - Disabled; requires special care and assistance. 63   - Severely disabled; hospital admission is indicated although death not imminent. 67   - Very sick; hospital admission necessary; active supportive treatment necessary. 10   - Moribund; fatal processes progressing rapidly. 0     - Dead  Karnofsky DA, Abelmann Ruskin, Craver LS and Burchenal [facility] [phone]) The use of the nitrogen mustards in the palliative treatment of carcinoma: with particular reference to bronchogenic carcinoma Cancer 1 634-56  LABORATORY DATA:  Lab Results  Component Value Date   WBC 4.8 04/25/2015   HGB 10.5 (L) 04/25/2015   HCT 32.3 (L) 04/25/2015   MCV 82.5 04/25/2015   PLT 112.0 (L) 04/25/2015   Lab Results  Component Value Date   NA 135 01/31/2015   K 3.8 01/31/2015   CL 106 01/31/2015   CO2 24 01/31/2015   Lab Results  Component Value Date   ALT 16 01/31/2015   AST 42 (H) 01/31/2015   ALKPHOS 79 01/31/2015   BILITOT 1.4 (H) 01/31/2015     RADIOGRAPHY: No results found.    IMPRESSION/PLAN: 1. 66 y.o.  gentleman with putative Stage IIB, T2bN0M0 NSCLC, of the right lower lobe. We spoke with  the patient and family about the findings and work-up thus far.  We discussed the natural history of non-small cell lung cancer and general treatment, highlighting the role of stereotactic body radiotherapy in the management.  We discussed the available radiation techniques, and focused on the details of logistics and delivery.  We reviewed the anticipated acute and late sequelae associated with SBRT in this setting versus image guided conventional radiotherapy over 6 1/2 weeks. We discussed the risks,  benefits, short, and long term effects of radiotherapy, and the patient is interested in proceeding. We will coordinate for simulation in the next week or so. 2. Weight loss and decreased appetite. We discussed a nutrition consultation. The patient is not  interested in a referral to nutrition at this time.  3. Tobacco use. The patient continues to smoke cigarettes. We discussed smoking cessation and will continue to discuss this at future visits. 4. Left arm pain/numbness x 3 days, Vision loss in right eye x 4 months, and Generalized weakness. We will consider scheduling the patient for a Brain MRI to rule out disease. Although this is likely arthritic related, we've encouraged the patient to follow up with PCP, and also to be seen urgently if his symptoms became acute or he had neurologic symptoms as well.     Carola Rhine, PAC  and   Sheral Apley Tammi Klippel, M.D.   This document serves as a record of services personally performed by Tyler Pita, MD and Shona Simpson, PA-C. It was created on their behalf by Arlyce Harman, a trained medical scribe. The creation of this record is based on the scribe's personal observations and the provider's statements to them. This document has been checked and approved by the attending provider.

## 2016-05-25 ENCOUNTER — Ambulatory Visit
Admission: RE | Admit: 2016-05-25 | Discharge: 2016-05-25 | Disposition: A | Discharge status: Home / Self Care | Payer: Non-veteran care | Source: Ambulatory Visit | Attending: Radiation Oncology | Admitting: Radiation Oncology

## 2016-05-25 ENCOUNTER — Encounter: Payer: Self-pay | Admitting: Radiation Oncology

## 2016-05-25 VITALS — BP 116/75 | HR 103 | Temp 98.0°F | Resp 18 | Ht 74.0 in | Wt 153.8 lb

## 2016-05-25 DIAGNOSIS — Z510 Encounter for antineoplastic radiation therapy: Secondary | ICD-10-CM | POA: Diagnosis present

## 2016-05-25 DIAGNOSIS — Z800 Family history of malignant neoplasm of digestive organs: Secondary | ICD-10-CM | POA: Diagnosis not present

## 2016-05-25 DIAGNOSIS — R200 Anesthesia of skin: Secondary | ICD-10-CM | POA: Diagnosis not present

## 2016-05-25 DIAGNOSIS — F17210 Nicotine dependence, cigarettes, uncomplicated: Secondary | ICD-10-CM | POA: Insufficient documentation

## 2016-05-25 DIAGNOSIS — I8510 Secondary esophageal varices without bleeding: Secondary | ICD-10-CM | POA: Diagnosis not present

## 2016-05-25 DIAGNOSIS — R531 Weakness: Secondary | ICD-10-CM | POA: Insufficient documentation

## 2016-05-25 DIAGNOSIS — I864 Gastric varices: Secondary | ICD-10-CM | POA: Insufficient documentation

## 2016-05-25 DIAGNOSIS — C61 Malignant neoplasm of prostate: Secondary | ICD-10-CM

## 2016-05-25 DIAGNOSIS — Z79899 Other long term (current) drug therapy: Secondary | ICD-10-CM | POA: Diagnosis not present

## 2016-05-25 DIAGNOSIS — Z825 Family history of asthma and other chronic lower respiratory diseases: Secondary | ICD-10-CM | POA: Insufficient documentation

## 2016-05-25 DIAGNOSIS — Z806 Family history of leukemia: Secondary | ICD-10-CM | POA: Insufficient documentation

## 2016-05-25 DIAGNOSIS — C3431 Malignant neoplasm of lower lobe, right bronchus or lung: Secondary | ICD-10-CM | POA: Diagnosis not present

## 2016-05-25 DIAGNOSIS — Z9889 Other specified postprocedural states: Secondary | ICD-10-CM | POA: Diagnosis not present

## 2016-05-25 DIAGNOSIS — H5461 Unqualified visual loss, right eye, normal vision left eye: Secondary | ICD-10-CM | POA: Diagnosis not present

## 2016-05-25 DIAGNOSIS — Z808 Family history of malignant neoplasm of other organs or systems: Secondary | ICD-10-CM | POA: Insufficient documentation

## 2016-05-25 DIAGNOSIS — R188 Other ascites: Secondary | ICD-10-CM | POA: Diagnosis not present

## 2016-05-25 DIAGNOSIS — K7460 Unspecified cirrhosis of liver: Secondary | ICD-10-CM | POA: Insufficient documentation

## 2016-05-25 HISTORY — DX: Malignant neoplasm of unspecified part of unspecified bronchus or lung: C34.90

## 2016-05-25 NOTE — Progress Notes (Signed)
See progress note under physician encounter. 

## 2016-06-02 ENCOUNTER — Telehealth: Payer: Self-pay | Admitting: *Deleted

## 2016-06-02 ENCOUNTER — Encounter: Payer: Self-pay | Admitting: *Deleted

## 2016-06-02 NOTE — Telephone Encounter (Signed)
CALLED PATIENT TO INFORM OF MRI ON 06-11-16 @ 8 AM @ [facility], NO RESTRICTIONS TO TEST, LVM FOR A RETURN CALL.

## 2016-06-02 NOTE — Progress Notes (Signed)
[place] Clinical Social Work  Holiday representative received referral from Pension scheme manager for transportation concerns.  CSW contacted patient at home to offer support and assess for needs.  Patient stated his cousin would be providing transportation to his treatments.  CSW informed patient of possible transportation resources, and encouraged patient to contact CSW with transportation concerns.  Patient was appreciative of contact and stated he would call as needed.     Johnnye Lana, MSW, LCSW, OSW-C Clinical Social Worker [facility] [phone]

## 2016-06-10 NOTE — Progress Notes (Signed)
  Radiation Oncology         [phone]) [phone] ________________________________  Name: Chad Sparks. MRN: [id number]  Date: 06/11/2016  DOB: 1950-12-18  STEREOTACTIC BODY RADIOTHERAPY SIMULATION AND TREATMENT PLANNING NOTE    ICD-9-CM ICD-10-CM   1. Primary cancer of right lower lobe of lung (HCC) 162.5 C34.31     DIAGNOSIS:  66 y.o.  gentleman with putative Stage IIB, T2bN0M0 NSCLC, of the right lower lobe.  NARRATIVE:  The patient was brought to the [place].  Identity was confirmed.  All relevant records and images related to the planned course of therapy were reviewed.  The patient freely provided informed written consent to proceed with treatment after reviewing the details related to the planned course of therapy. The consent form was witnessed and verified by the simulation staff.  Then, the patient was set-up in a stable reproducible  supine position for radiation therapy.  A BodyFix immobilization pillow was fabricated for reproducible positioning.  Then I personally applied the abdominal compression paddle to limit respiratory excursion.  4D respiratoy motion management CT images were obtained.  Surface markings were placed.  The CT images were loaded into the planning software.  Then, using Cine, MIP, and standard views, the internal target volume (ITV) and planning target volumes (PTV) were delinieated, and avoidance structures were contoured.  Treatment planning then occurred.  The radiation prescription was entered and confirmed.  A total of two complex treatment devices were fabricated in the form of the BodyFix immobilization pillow and a neck accuform cushion.  I have requested : 3D Simulation  I have requested a DVH of the following structures: Heart, Lungs, Esophagus, Chest Wall, Brachial Plexus, Major Blood Vessels, and targets.  SPECIAL TREATMENT PROCEDURE:  The planned course of therapy using radiation constitutes a special treatment procedure. Special care  is required in the management of this patient for the following reasons. This treatment constitutes a Special Treatment Procedure for the following reason: [ High dose per fraction requiring special monitoring for increased toxicities of treatment including daily imaging..  The special nature of the planned course of radiotherapy will require increased physician supervision and oversight to ensure patient's safety with optimal treatment outcomes.  RESPIRATORY MOTION MANAGEMENT SIMULATION:  In order to account for effect of respiratory motion on target structures and other organs in the planning and delivery of radiotherapy, this patient underwent respiratory motion management simulation.  To accomplish this, when the patient was brought to the CT simulation planning suite, 4D respiratoy motion management CT images were obtained.  The CT images were loaded into the planning software.  Then, using a variety of tools including Cine, MIP, and standard views, the target volume and planning target volumes (PTV) were delineated.  Avoidance structures were contoured.  Treatment planning then occurred.  Dose volume histograms were generated and reviewed for each of the requested structure.  The resulting plan was carefully reviewed and approved today.  PLAN:  The patient will receive 50 Gy in 10 fractions.  ________________________________  Sheral Apley Tammi Klippel, M.D.  This document serves as a record of services personally performed by Tyler Pita, MD. It was created on his behalf by Arlyce Harman, a trained medical scribe. The creation of this record is based on the scribe's personal observations and the provider's statements to them. This document has been checked and approved by the attending provider.

## 2016-06-11 ENCOUNTER — Ambulatory Visit (HOSPITAL_COMMUNITY)
Admission: RE | Admit: 2016-06-11 | Discharge: 2016-06-11 | Disposition: A | Discharge status: Home / Self Care | Payer: Non-veteran care | Source: Ambulatory Visit | Attending: Radiation Oncology | Admitting: Radiation Oncology

## 2016-06-11 ENCOUNTER — Ambulatory Visit
Admission: RE | Admit: 2016-06-11 | Discharge: 2016-06-11 | Disposition: A | Discharge status: Home / Self Care | Payer: Non-veteran care | Source: Ambulatory Visit | Attending: Radiation Oncology | Admitting: Radiation Oncology

## 2016-06-11 DIAGNOSIS — C3431 Malignant neoplasm of lower lobe, right bronchus or lung: Secondary | ICD-10-CM | POA: Insufficient documentation

## 2016-06-11 DIAGNOSIS — Z510 Encounter for antineoplastic radiation therapy: Secondary | ICD-10-CM | POA: Diagnosis not present

## 2016-06-11 DIAGNOSIS — G3189 Other specified degenerative diseases of nervous system: Secondary | ICD-10-CM | POA: Insufficient documentation

## 2016-06-11 LAB — POCT I-STAT CREATININE: CREATININE: 1.2 mg/dL (ref 0.61–1.24)

## 2016-06-11 MED ORDER — GADOBENATE DIMEGLUMINE 529 MG/ML IV SOLN
15.0000 mL | Freq: Once | INTRAVENOUS | Status: AC | PRN
  Administered 2016-06-11: 14 mL via INTRAVENOUS

## 2016-06-17 ENCOUNTER — Ambulatory Visit: Payer: Non-veteran care

## 2016-06-18 ENCOUNTER — Ambulatory Visit: Payer: Non-veteran care

## 2016-06-19 ENCOUNTER — Ambulatory Visit: Payer: Non-veteran care

## 2016-06-22 ENCOUNTER — Ambulatory Visit: Payer: Non-veteran care

## 2016-06-22 ENCOUNTER — Ambulatory Visit: Admission: RE | Admit: 2016-06-22 | Payer: Non-veteran care | Source: Ambulatory Visit | Admitting: Radiation Oncology

## 2016-06-22 DIAGNOSIS — Z510 Encounter for antineoplastic radiation therapy: Secondary | ICD-10-CM | POA: Diagnosis not present

## 2016-06-23 ENCOUNTER — Ambulatory Visit: Payer: Non-veteran care

## 2016-06-23 ENCOUNTER — Ambulatory Visit: Payer: Non-veteran care | Admitting: Radiation Oncology

## 2016-06-24 ENCOUNTER — Ambulatory Visit: Payer: Non-veteran care | Admitting: Radiation Oncology

## 2016-06-24 ENCOUNTER — Ambulatory Visit: Payer: Non-veteran care

## 2016-06-25 ENCOUNTER — Ambulatory Visit: Payer: Non-veteran care

## 2016-06-25 ENCOUNTER — Ambulatory Visit: Payer: Non-veteran care | Admitting: Radiation Oncology

## 2016-06-26 ENCOUNTER — Ambulatory Visit: Payer: Non-veteran care

## 2016-06-29 ENCOUNTER — Ambulatory Visit: Payer: Non-veteran care

## 2016-06-29 ENCOUNTER — Ambulatory Visit: Payer: Non-veteran care | Admitting: Radiation Oncology

## 2016-06-30 ENCOUNTER — Ambulatory Visit: Payer: Non-veteran care | Admitting: Radiation Oncology

## 2016-06-30 ENCOUNTER — Ambulatory Visit: Payer: Non-veteran care

## 2016-07-01 ENCOUNTER — Ambulatory Visit: Payer: Non-veteran care | Admitting: Radiation Oncology

## 2016-07-01 ENCOUNTER — Ambulatory Visit: Payer: Non-veteran care

## 2016-07-02 ENCOUNTER — Ambulatory Visit: Payer: Non-veteran care

## 2016-07-02 ENCOUNTER — Ambulatory Visit: Payer: Non-veteran care | Admitting: Radiation Oncology

## 2016-07-03 ENCOUNTER — Ambulatory Visit: Payer: Non-veteran care

## 2016-07-06 ENCOUNTER — Ambulatory Visit: Payer: Non-veteran care | Admitting: Radiation Oncology

## 2016-07-06 ENCOUNTER — Ambulatory Visit: Payer: Non-veteran care

## 2016-07-06 ENCOUNTER — Telehealth: Payer: Self-pay | Admitting: Radiation Oncology

## 2016-07-06 ENCOUNTER — Encounter: Payer: Self-pay | Admitting: *Deleted

## 2016-07-06 NOTE — Progress Notes (Signed)
[place] Clinical Social Work  Holiday representative contacted patient to follow up regarding transportation concerns.  [facility] stated [facility], and went to his mailing address not the address he was currently staying.  Patient was given a number by the transportation service, and told to call at the [place].  Patient stated he called and provided the correct address.  CSW contact [facility] transportation to confirm correct address and transportation.  CSW left message, awaiting return call.  Johnnye Lana, MSW, LCSW, OSW-C Clinical Social Worker [facility] [phone]

## 2016-07-06 NOTE — Telephone Encounter (Signed)
Informed by Nira Conn, RT on L1 that the patient presented for his initial treatment but, none since then. Phoned patient to inquire. He reports the ride the social workers arranged for him is going to his mailing address (sister's house) instead of where he stays. Patient reports since he can't walk or drive he is unable to be at his sister's house for pick up. Patient reports he resides at [address] in [place] which is an extended stay place. Patient understands this RN will forward this information along to the social work staff and therapist. Patient verbalized understanding.

## 2016-07-07 ENCOUNTER — Ambulatory Visit: Payer: Non-veteran care | Admitting: Radiation Oncology

## 2016-07-07 ENCOUNTER — Ambulatory Visit: Payer: Non-veteran care

## 2016-07-08 ENCOUNTER — Ambulatory Visit: Payer: Non-veteran care

## 2016-07-08 ENCOUNTER — Ambulatory Visit: Admission: RE | Admit: 2016-07-08 | Payer: Non-veteran care | Source: Ambulatory Visit | Admitting: Radiation Oncology

## 2016-07-08 ENCOUNTER — Ambulatory Visit: Payer: Non-veteran care | Admitting: Radiation Oncology

## 2016-07-09 ENCOUNTER — Ambulatory Visit: Payer: Non-veteran care

## 2016-07-09 ENCOUNTER — Ambulatory Visit: Payer: Non-veteran care | Admitting: Radiation Oncology

## 2016-07-10 ENCOUNTER — Ambulatory Visit: Payer: Non-veteran care

## 2016-07-13 ENCOUNTER — Ambulatory Visit: Payer: Non-veteran care

## 2016-07-13 ENCOUNTER — Ambulatory Visit: Payer: Non-veteran care | Admitting: Radiation Oncology

## 2016-07-14 ENCOUNTER — Ambulatory Visit: Payer: Non-veteran care

## 2016-07-14 ENCOUNTER — Ambulatory Visit: Payer: Non-veteran care | Admitting: Radiation Oncology

## 2016-07-15 ENCOUNTER — Ambulatory Visit: Payer: Non-veteran care

## 2016-07-15 ENCOUNTER — Ambulatory Visit: Payer: Non-veteran care | Admitting: Radiation Oncology

## 2016-07-16 ENCOUNTER — Ambulatory Visit: Payer: Non-veteran care

## 2016-07-16 ENCOUNTER — Ambulatory Visit: Payer: Non-veteran care | Admitting: Radiation Oncology

## 2016-07-17 ENCOUNTER — Ambulatory Visit: Payer: Non-veteran care

## 2016-07-17 ENCOUNTER — Encounter: Payer: Self-pay | Admitting: *Deleted

## 2016-07-17 NOTE — Progress Notes (Signed)
[place] Work  Clinical Social Work continues to follow in order to assist with transportation. CSW has made multiple attempts to contact pt, but his voicemail is not set up and there is no way to leave a message.  Pt residing at an extended stay motel and it is on the bus line. Pt also has access to [place] transportation resources. Pt could also use [facility] or [facility] as other options. CSW will attempt to meet with pt at appointment on 07/20/16 to further assess needs.     Loren Racer, LCSW, OSW-C Clinical Social Worker [place]  [place] Phone: [phone] Fax: [phone]

## 2016-07-20 ENCOUNTER — Ambulatory Visit: Payer: Non-veteran care | Admitting: Radiation Oncology

## 2016-07-20 ENCOUNTER — Ambulatory Visit: Payer: Non-veteran care

## 2016-07-21 ENCOUNTER — Ambulatory Visit: Payer: Non-veteran care

## 2016-07-21 ENCOUNTER — Ambulatory Visit: Payer: Non-veteran care | Admitting: Radiation Oncology

## 2016-07-22 ENCOUNTER — Ambulatory Visit: Payer: Non-veteran care | Admitting: Radiation Oncology

## 2016-07-22 ENCOUNTER — Ambulatory Visit: Payer: Non-veteran care

## 2016-07-23 ENCOUNTER — Ambulatory Visit: Payer: Non-veteran care

## 2016-07-23 ENCOUNTER — Ambulatory Visit: Payer: Non-veteran care | Admitting: Radiation Oncology

## 2016-07-24 ENCOUNTER — Ambulatory Visit: Payer: Non-veteran care

## 2016-07-27 ENCOUNTER — Ambulatory Visit: Payer: Non-veteran care | Admitting: Radiation Oncology

## 2016-07-28 ENCOUNTER — Ambulatory Visit: Payer: Non-veteran care | Admitting: Radiation Oncology

## 2016-07-28 ENCOUNTER — Ambulatory Visit: Payer: Non-veteran care

## 2016-07-29 ENCOUNTER — Ambulatory Visit: Payer: Non-veteran care

## 2016-07-29 ENCOUNTER — Ambulatory Visit: Payer: Non-veteran care | Admitting: Radiation Oncology

## 2016-07-30 ENCOUNTER — Ambulatory Visit: Payer: Non-veteran care | Admitting: Radiation Oncology

## 2016-07-30 ENCOUNTER — Ambulatory Visit: Payer: Non-veteran care

## 2016-07-31 ENCOUNTER — Ambulatory Visit: Payer: Non-veteran care | Admitting: Radiation Oncology

## 2016-07-31 ENCOUNTER — Ambulatory Visit: Payer: Non-veteran care

## 2016-08-03 ENCOUNTER — Ambulatory Visit: Payer: Non-veteran care

## 2016-09-10 ENCOUNTER — Encounter: Payer: Self-pay | Admitting: Radiation Oncology

## 2016-09-10 NOTE — Progress Notes (Signed)
  Radiation Oncology         [phone]) [phone] ________________________________  Name: Chad Sparks. MRN: [id number]  Date: 09/10/2016  DOB: Mar 01, 1951   End of Treatment Note  Diagnosis:   66 y.o. gentleman with putative Stage IIB, T2bN0M0 NSCLC, of the right lower lobe.  Indication for treatment:  Curative, Chemo-Radiotherapy       Radiation treatment dates:   Treatment was never started, due to patient transportation issues.  We are still making attempts to provide treatment.  Given the prolonged break after planning, we are closing out his treatment record for now. ________________________________  Chad Sparks. Tammi Klippel, M.D.  This document serves as a record of services personally performed by Tyler Pita MD. It was created on his behalf by Delton Coombes, a trained medical scribe. The creation of this record is based on the scribe's personal observations and the provider's statements to them. This document has been checked and approved by the attending provider.

## 2016-09-11 ENCOUNTER — Ambulatory Visit (HOSPITAL_COMMUNITY)
Admission: EM | Admit: 2016-09-11 | Discharge: 2016-09-11 | Disposition: A | Discharge status: Home / Self Care | Payer: Non-veteran care | Attending: Internal Medicine | Admitting: Internal Medicine

## 2016-09-11 ENCOUNTER — Telehealth: Payer: Self-pay | Admitting: Radiation Oncology

## 2016-09-11 ENCOUNTER — Encounter (HOSPITAL_COMMUNITY): Payer: Self-pay | Admitting: Emergency Medicine

## 2016-09-11 DIAGNOSIS — Z825 Family history of asthma and other chronic lower respiratory diseases: Secondary | ICD-10-CM | POA: Insufficient documentation

## 2016-09-11 DIAGNOSIS — F17210 Nicotine dependence, cigarettes, uncomplicated: Secondary | ICD-10-CM | POA: Insufficient documentation

## 2016-09-11 DIAGNOSIS — L089 Local infection of the skin and subcutaneous tissue, unspecified: Secondary | ICD-10-CM

## 2016-09-11 DIAGNOSIS — Z79899 Other long term (current) drug therapy: Secondary | ICD-10-CM | POA: Insufficient documentation

## 2016-09-11 DIAGNOSIS — J439 Emphysema, unspecified: Secondary | ICD-10-CM | POA: Insufficient documentation

## 2016-09-11 DIAGNOSIS — Z808 Family history of malignant neoplasm of other organs or systems: Secondary | ICD-10-CM | POA: Insufficient documentation

## 2016-09-11 DIAGNOSIS — N189 Chronic kidney disease, unspecified: Secondary | ICD-10-CM | POA: Diagnosis not present

## 2016-09-11 DIAGNOSIS — Z8589 Personal history of malignant neoplasm of other organs and systems: Secondary | ICD-10-CM | POA: Diagnosis not present

## 2016-09-11 DIAGNOSIS — C3490 Malignant neoplasm of unspecified part of unspecified bronchus or lung: Secondary | ICD-10-CM | POA: Insufficient documentation

## 2016-09-11 DIAGNOSIS — L03032 Cellulitis of left toe: Secondary | ICD-10-CM | POA: Diagnosis not present

## 2016-09-11 DIAGNOSIS — Z7951 Long term (current) use of inhaled steroids: Secondary | ICD-10-CM | POA: Insufficient documentation

## 2016-09-11 DIAGNOSIS — L609 Nail disorder, unspecified: Secondary | ICD-10-CM | POA: Diagnosis present

## 2016-09-11 MED ORDER — SULFAMETHOXAZOLE-TRIMETHOPRIM 800-160 MG PO TABS: 1.0000 | ORAL_TABLET | Freq: Two times a day (BID) | ORAL | 0 refills | Status: AC

## 2016-09-11 NOTE — ED Provider Notes (Signed)
CSN: [id number]     Arrival date & time 09/11/16  1921 History   First MD Initiated Contact with Patient 09/11/16 2133     Chief Complaint  Patient presents with  . Toe Injury  . Nail Problem   (Consider location/radiation/quality/duration/timing/severity/associated sxs/prior Treatment) 66 year old male with a previous history of alcohol abuse, chronic kidney disease, COPD, and emphysema presents to clinic in care of his sister, who is a caregiver, with a chief complaint of an infected toenail left great toe. Patient is a poor historian, unable to provide history. sister reports that his toenail was normal 2-3 days ago when she last saw him, however today she found him with his current condition, with dried blood around the toe, and states that when she mashed it, pus came out. Patient was asleep during the exam, however he was awoken for questioning, denied indicate any distress.   The history is provided by a relative.    Past Medical History:  Diagnosis Date  . Alcohol abuse   . Allergy   . Chronic kidney disease   . COPD (chronic obstructive pulmonary disease) ([place])   . Emphysema of lung ([place])   . Heavy cigarette smoker   . Hepatitis C   . Lung cancer [facility])    Past Surgical History:  Procedure Laterality Date  . ESOPHAGOGASTRODUODENOSCOPY (EGD) WITH PROPOFOL N/A 10/15/2014   Procedure: ESOPHAGOGASTRODUODENOSCOPY (EGD) WITH PROPOFOL;  Surgeon: Wonda Horner, MD;  Location: [facility] ENDOSCOPY;  Service: Endoscopy;  Laterality: N/A;   Family History  Problem Relation Age of Onset  . Throat cancer Mother   . Cancer Mother        throat  . Emphysema Father   . Brain cancer Brother   . Cancer Brother        brain  . Cancer Paternal Grandmother        leukemia   Social History  Substance Use Topics  . Smoking status: Current Every Day Smoker    Packs/day: 0.50    Years: 53.00    Types: Cigarettes  . Smokeless tobacco: Never Used  . Alcohol use Yes     Comment:  2-4 beers a  month.    Review of Systems  Constitutional: Negative.   HENT: Negative.   Respiratory: Negative.   Cardiovascular: Negative.   Gastrointestinal: Negative.   Musculoskeletal: Negative.   Skin:       Infected right toe nail    Allergies  Other  Home Medications   Prior to Admission medications   Medication Sig Start Date End Date Taking? Authorizing Provider  albuterol (PROVENTIL HFA;VENTOLIN HFA) 108 (90 Base) MCG/ACT inhaler Inhale 2 puffs into the lungs every 6 (six) hours as needed for wheezing or shortness of breath. Patient not taking: Reported on 05/25/2016 04/25/15   Saguier, Percell Miller, PA-C  fluticasone (FLOVENT HFA) 110 MCG/ACT inhaler Inhale 2 puffs into the lungs 2 (two) times daily. Patient not taking: Reported on 05/25/2016 04/25/15   Saguier, Percell Miller, PA-C  gabapentin (NEURONTIN) 100 MG capsule Take 1 capsule (100 mg total) by mouth at bedtime. Patient not taking: Reported on 05/25/2016 02/06/15   Saguier, Percell Miller, PA-C  ibuprofen (ADVIL) 200 MG tablet Take 200 mg by mouth every 6 (six) hours as needed.    [provider]  ipratropium (ATROVENT HFA) 17 MCG/ACT inhaler Inhale 2 puffs into the lungs every 6 (six) hours as needed for wheezing. Patient not taking: Reported on 05/25/2016 01/31/15   Saguier, Percell Miller, PA-C  metoCLOPramide (REGLAN) 10 MG  tablet Take 1 tablet (10 mg total) by mouth 3 (three) times daily before meals. Patient not taking: Reported on 05/25/2016 02/20/15   Saguier, Percell Miller, PA-C  omeprazole (PRILOSEC) 20 MG capsule Take 1 capsule (20 mg total) by mouth daily. Patient not taking: Reported on 05/25/2016 01/31/15   Saguier, Percell Miller, PA-C  sulfamethoxazole-trimethoprim (BACTRIM DS,SEPTRA DS) 800-160 MG tablet Take 1 tablet by mouth 2 (two) times daily. 09/11/16 09/18/16  Barnet Glasgow, NP  tamsulosin (FLOMAX) 0.4 MG CAPS capsule Take 1 capsule (0.4 mg total) by mouth daily. Patient not taking: Reported on 05/25/2016 02/20/15   Saguier, Percell Miller, PA-C   Meds  Ordered and Administered this Visit  Medications - No data to display  BP 118/75 (BP Location: Right Arm)   Pulse (!) 110   Temp 98.7 F (37.1 C) (Oral)   Resp 20   SpO2 94%  No data found.   Physical Exam  Constitutional: He is oriented to person, place, and time. He appears well-developed and well-nourished. No distress.  HENT:  Head: Normocephalic and atraumatic.  Right Ear: External ear normal.  Left Ear: External ear normal.  Eyes: Conjunctivae are normal.  Cardiovascular:  Pulses:      Dorsalis pedis pulses are 2+ on the right side, and 2+ on the left side.       Posterior tibial pulses are 2+ on the right side, and 2+ on the left side.  Neurological: He is alert and oriented to person, place, and time.  Skin: Skin is warm and dry. Capillary refill takes less than 2 seconds. He is not diaphoretic.  Nail bed appears elevated with dried blood. purulent material expressed with palpation.  Psychiatric: He has a normal mood and affect. His behavior is normal.  Nursing note and vitals reviewed.   Urgent Care Course     Procedures (including critical care time)  Labs Review Labs Reviewed  AEROBIC CULTURE (SUPERFICIAL SPECIMEN)    Imaging Review No results found.    MDM   1. Infection of nail bed of toe of left foot    Nail will most likely need to be extracted, wound cultures obtained tonight will be sent for sensitivity, started on Bactrim, provided wound care guidelines. Referral made to podiatry for further evaluation and management of this condition.     Barnet Glasgow, NP 09/11/16 2222

## 2016-09-11 NOTE — Discharge Instructions (Signed)
Believe your toe nail bed is infected, and the toenail most likely will need to be removed, recommend Betadine soak 20 minutes at a time at least daily, cover with an over-the-counter antibiotic ointment. Started on Bactrim, one tablet twice a day. Provided referral for podiatry, contact their office to schedule an appointment for follow-up care. Also wound cultures have been obtained, and will be sent for culture and sensitivity testing

## 2016-09-11 NOTE — Telephone Encounter (Signed)
Received voicemail message from patient's sister, Vanita Ingles, wanting to know the type and stage of her brother's cancer. Phoned her back and explained he has adenocarcinoma of the lung, stage II. She verbalized understanding. Encouraged her to phone back with future questions.

## 2016-09-11 NOTE — ED Triage Notes (Signed)
Sister brings pt in for right toe pain/inj onset today  Pt is a poor historian... denies inj/trauma... Has blood from underneath toenail along w/pus drainage.   Brought back on wheelchair.   Pt denies pain   A&O x4... NAD

## 2016-09-14 LAB — AEROBIC CULTURE W GRAM STAIN (SUPERFICIAL SPECIMEN): Gram Stain: NONE SEEN

## 2016-09-14 LAB — AEROBIC CULTURE  (SUPERFICIAL SPECIMEN)

## 2016-09-30 ENCOUNTER — Encounter: Payer: Self-pay | Admitting: Podiatry

## 2016-09-30 ENCOUNTER — Ambulatory Visit (INDEPENDENT_AMBULATORY_CARE_PROVIDER_SITE_OTHER): Payer: Non-veteran care | Admitting: Podiatry

## 2016-09-30 DIAGNOSIS — M79676 Pain in unspecified toe(s): Secondary | ICD-10-CM | POA: Diagnosis not present

## 2016-09-30 DIAGNOSIS — B351 Tinea unguium: Secondary | ICD-10-CM | POA: Diagnosis not present

## 2016-09-30 DIAGNOSIS — L601 Onycholysis: Secondary | ICD-10-CM

## 2016-10-02 NOTE — Progress Notes (Signed)
   SUBJECTIVE Patient  presents to office today complaining of elongated, thickened nails. Pain while ambulating in shoes. Patient is unable to trim their own nails.  He denies trauma as well as pain. Patient went to the urgent care regarding the right great toenail on 09/11/2016. He is placed on Bactrim DS. Patient presents today as areferral for follow-up treatment and evaluation  OBJECTIVE General Patient is awake, alert, and oriented x 3 and in no acute distress. Derm Skin is dry and supple bilateral. Negative open lesions or macerations. Remaining integument unremarkable. Nails are tender, long, thickened and dystrophic with subungual debris, consistent with onychomycosis, 1-5 bilateral. No signs of infection noted. The right great toenail appears approximately 95% detached from the underlying nail bed. Vasc  DP and PT pedal pulses palpable bilaterally. Temperature gradient within normal limits.  Neuro Epicritic and protective threshold sensation diminished bilaterally.  Musculoskeletal Exam No symptomatic pedal deformities noted bilateral. Muscular strength within normal limits.  ASSESSMENT 1. Onychodystrophic nails 1-5  right with hyperkeratosis of nails.  2. Onycholysis right great toenail  PLAN OF CARE 1. Patient evaluated today.  2. Instructed to maintain good pedal hygiene and foot care.  3. Mechanical debridement of nails 1-5 bilaterally performed using a nail nipper. Filed with dremel without incident.  4.  Mechanical debridement of the right great toenails performed all the way back to the level of the nail matrix.Patient denied pain in those minimal bleeding. Antibiotic ointment and Band-Aid was applied. The patient the nail should likely regrow however no control over how the nail regrows. This process may take 9-12 months 5.Return to clinic in 3 mos.    Edrick Kins, DPM [facility]  Dr. Edrick Kins, [place]                                         [place], [place] [address]                Office [phone]  Fax [phone]

## 2017-02-19 NOTE — Progress Notes (Addendum)
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   Patient presented with a chronic, non productive cough for the past 4-5 months.  Received 03-12-16 completed 04-01-16   Tobacco/Marijuana/Snuff/ETOH use: Quit 01/08/16 but resumed smoking. Reports 1 pack of cigarette will last him three days.   Past/Anticipated interventions by cardiothoracic surgery, if any: No  Past/Anticipated interventions by medical oncology, if any: No  Signs/Symptoms  Weight changes, if any: Reports his healthy weight was 205 lb. Patient weighs 147.4 lb today.   Respiratory complaints, if any: Reports a chronic non productive cough and SOB with mild exertion. Denies chest pain or difficulty swallowing.   Hemoptysis, if any: No  Pain issues, if any: No, Calves hurt when he walks ongoing. Reports his feet are swollen and his calves feel hard. Reports these symptoms have persisted for over a year.   SAFETY ISSUES:History of falls has not fallen in the past four months  Prior radiation? no  Pacemaker/ICD? no   Possible current pregnancy?no  Is the patient on methotrexate? no  Current Complaints / other details:  66 year old male. Reports he was exposed to agent orange. Accompanied today by his home health aide. Reports he has been unable to see from his right eye x 11 months. Denies headaches, dizziness, nausea or vomiting.  6 pound weight loss since his last visit states he is eating. Wt Readings from Last 3 Encounters:  03/04/17 147 lb 6.4 oz (66.9 kg)  05/25/16 153 lb 12.8 oz (69.8 kg)  04/25/15 159 lb 9.6 oz (72.4 kg)  BP 117/86 (BP Location: Left Arm, Patient Position: Sitting, Cuff Size: Normal)   Pulse (!) 103   Temp 98.4 F (36.9 C) (Oral)   Resp 18   Ht 6\' 2"  (1.88 m)   Wt 147 lb 6.4 oz (66.9 kg)   SpO2 99%   BMI 18.93 kg/m

## 2017-03-04 ENCOUNTER — Encounter: Payer: Self-pay | Admitting: General Practice

## 2017-03-04 ENCOUNTER — Ambulatory Visit
Admission: RE | Admit: 2017-03-04 | Discharge: 2017-03-04 | Disposition: A | Discharge status: Home / Self Care | Payer: Non-veteran care | Source: Ambulatory Visit | Attending: Radiation Oncology | Admitting: Radiation Oncology

## 2017-03-04 ENCOUNTER — Telehealth: Payer: Self-pay | Admitting: *Deleted

## 2017-03-04 ENCOUNTER — Other Ambulatory Visit: Payer: Self-pay

## 2017-03-04 ENCOUNTER — Ambulatory Visit
Admission: RE | Admit: 2017-03-04 | Discharge: 2017-03-04 | Disposition: A | Discharge status: Home / Self Care | Payer: Non-veteran care | Source: Ambulatory Visit | Attending: Urology | Admitting: Urology

## 2017-03-04 ENCOUNTER — Encounter: Payer: Self-pay | Admitting: Urology

## 2017-03-04 VITALS — BP 117/86 | HR 103 | Temp 98.4°F | Resp 18 | Ht 74.0 in | Wt 147.4 lb

## 2017-03-04 DIAGNOSIS — J439 Emphysema, unspecified: Secondary | ICD-10-CM | POA: Diagnosis not present

## 2017-03-04 DIAGNOSIS — C3431 Malignant neoplasm of lower lobe, right bronchus or lung: Secondary | ICD-10-CM | POA: Insufficient documentation

## 2017-03-04 DIAGNOSIS — F17210 Nicotine dependence, cigarettes, uncomplicated: Secondary | ICD-10-CM | POA: Insufficient documentation

## 2017-03-04 DIAGNOSIS — Z681 Body mass index (BMI) 19 or less, adult: Secondary | ICD-10-CM | POA: Diagnosis not present

## 2017-03-04 DIAGNOSIS — I864 Gastric varices: Secondary | ICD-10-CM | POA: Insufficient documentation

## 2017-03-04 DIAGNOSIS — K7460 Unspecified cirrhosis of liver: Secondary | ICD-10-CM | POA: Diagnosis not present

## 2017-03-04 DIAGNOSIS — N189 Chronic kidney disease, unspecified: Secondary | ICD-10-CM | POA: Diagnosis not present

## 2017-03-04 DIAGNOSIS — R188 Other ascites: Secondary | ICD-10-CM | POA: Diagnosis not present

## 2017-03-04 DIAGNOSIS — Z808 Family history of malignant neoplasm of other organs or systems: Secondary | ICD-10-CM | POA: Insufficient documentation

## 2017-03-04 DIAGNOSIS — I8510 Secondary esophageal varices without bleeding: Secondary | ICD-10-CM | POA: Insufficient documentation

## 2017-03-04 DIAGNOSIS — R630 Anorexia: Secondary | ICD-10-CM | POA: Diagnosis not present

## 2017-03-04 DIAGNOSIS — R634 Abnormal weight loss: Secondary | ICD-10-CM | POA: Insufficient documentation

## 2017-03-04 DIAGNOSIS — Z825 Family history of asthma and other chronic lower respiratory diseases: Secondary | ICD-10-CM | POA: Insufficient documentation

## 2017-03-04 NOTE — Progress Notes (Signed)
[place] CSW Progress Note  Call from [place] transport provider, states they cannot enter the [facility] home to get patient to the curb for transport.  CSW left this information for patient's [place], Glean Hess [phone]).  Requested VA problem solve w patient to address need for transport to and from all medical appointments.  Awaiting return call from Ms [place].  Edwyna Shell, LCSW Clinical Social Worker Phone:  [phone]

## 2017-03-04 NOTE — Telephone Encounter (Signed)
CALLED PATIENT TO INFORM OF NUTRITION APPT. WITH BARBARA NEFF ON 03-08-17 @ 9 AM WITH BARBARA NEFF AND HIS PET SCAN ON 03-17-17 - ARRIVAL TIME - 6:30 AM, PT. TO BE NPO- AFTER MIDNIGHT, TEST TO BE @ [facility], AND HIS FNC APPT. WITH ASHLYN [place] ON 03-19-17 - ARRIVAL TIME - 8:45 AM, SPOKE WITH PATIENT AND HE IS AWARE OF THESE APPTS.

## 2017-03-04 NOTE — Progress Notes (Signed)
[place] Psychosocial Distress Screening Clinical Social Work  Clinical Social Work was referred by distress screening protocol.  The patient scored a 0 on the Psychosocial Distress Thermometer which indicates no distress. Clinical Social Worker Chad Sparks to assess for distress and other psychosocial needs. Talked w patient by phone.  Is transported to appointments by home health aide who only works 3 hours/day (7 - 10 AM).  Lives w sister Chad Sparks in her condo which is not handicap accessible.  Uses wheelchair all the time (when not in lift chair) - aide must maneuver wheelchair down set of stairs to get to curb.  Patient cannot use [facility] or similar transport as he cannot get out to streetside independently.  Per patient, his appointments need to be scheduled within the 3 hour block of time his aide is available to transport - CSW cannot arrange other transport due to issues w getting out of the house to curbside.  Per patient, he has daily home health aide arranged via [place], remains in his lift chair after aide leaves at 10 AM until sister returns home after work.  Per patient, OK to talk w [facility] and sister to discuss additional needs.  Mentioned that he drinks Ensure and finds it difficult to afford, nutritionist notified of need.  CSW will discuss his case w VA personnel and document in separate note.  ONCBCN DISTRESS SCREENING 03/04/2017  Screening Type Change in Status  Distress experienced in past week (1-10) 0  Practical problem type Insurance;Transportation  Emotional problem type Boredom;Adjusting to appearance changes  Spiritual/Religous concerns type Loss of sense of purpose  Information Concerns Type Lack of info about diagnosis  Physical Problem type Pain;Getting around;Bathing/dressing;Breathing;Loss of appetitie;Changes in urination;Sexual problems;Skin dry/itchy  Physician notified of physical symptoms Yes  Referral to clinical psychology   Referral to clinical social work   Referral to  dietition      Clinical Social Worker follow up needed: Yes.    Coordinate care and needs w [facility] Primary Care program (RN is ChiChi Mauri Pole and care manager is Delynn Flavin [phone])  Beneficiary travel through [place] - has been approved for wheelchair transport to all appointments through 10/19, requires 2 day notice to schedule; [phone] - [id number]; patient must call to schedule rides  Travel expense reimbursement:  Patient has been approved for reimbursement of family member transporting to appointments; reimbursements go to patient who must then reimburse transporter  [facility] CSW:  Glean Hess [phone])  [facility] program:  Provides wrap around services to patient in the home - lead staff in [place] [phone])  If yes, follow up plan:  CSW awaiting calls back from [place] and [place] program; nutritionist advised of patient request for later appointment - is unable to accommodate as he has first available in the day but will provide him w case of Ensure; sister Chad Sparks to explore any further needs in the home.    Chad Shell, LCSW Clinical Social Worker Phone:  [phone]

## 2017-03-04 NOTE — Progress Notes (Signed)
[place] CSW Progress Note  CSW unable to reach patient by VM; spoke w sister Stevon Gough.  Reviewed transport options, provided VA contact information, encouraged family to schedule transport using VA resources.  Edwyna Shell, LCSW Clinical Social Worker Phone:  [phone]

## 2017-03-05 ENCOUNTER — Encounter: Payer: Self-pay | Admitting: General Practice

## 2017-03-05 NOTE — Progress Notes (Signed)
Complex Case Management Note   Does the patient have a legal guardian (if yes, please add name and contact information of guardian):  No.   Has guardianship paperwork been requested/received?: No.   Does the patient have an IDD diagnosis?: No.   [employer] [place] (and contact information if applicable):  Sisters [place] and [place]. Patient has allowed contact w both parties, lives w sister Vanita Ingles.    Outside agencies involved: [facility] provides support in community. Social worker:  Glean Hess [phone] x [phone] (if unable to reach, contact supervisor Gwenyth Bouillon [phone] x [phone] [facility] Primary Care RN in charge:  Su Grand [phone]) Beneficiary transportation requests:  [phone]  Medical needs/complications (eg. 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 Name of facility, contact, and date of referral):  Name of Facility Contact Information Date of referral What else is needed?                        Other:  03/05/16:  CSW spoke w [place], reviewed difficulty of getting patient to scheduled appointments which has resulted in delay of care; advised that patient uses wheelchair for mobility, has no wheelchair ramp in the home.  Is eligible for VA transport which is threshold to threshold - pt must be able to get in/out of the house by himself or with caregiver assistance.  Transport will not enter the home to get patient out of the home.  VA CSW will investigate various options to assist including wheelchair ramp for sisters condo, asking for additional aide time so patient can be successfully cared for as his needs have changes (currently approved for 15 hours/week), requesting placement in SNF for duration of radiation treatment to  overcome barriers of access to the home.  VA CSW will explore these options and advise which are feasible under patient insurance coverage.  Asks that appointments for patient be scheduled in the early AM so that sister can assist w getting patient out of the home and into [place] transport vehicle - then aide can be scheduled to assist w getting patient into the home after treatments.  CSW will communicate this request to the treatment team/scheduling.  [facility] CSW will speak w sister, patient and VA resources in order to work on coordinating care for patient.     Beverely Pace, LCSW 03/05/2017 9:39 AM

## 2017-03-05 NOTE — Progress Notes (Signed)
Radiation Oncology         [phone] ________________________________  Outpatient Re-Consultation  Name: Chad Sparks. 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 Primary cancer of right lower lobe of lung (HCC) C34.31 NM PET Image Restag (PS) Skull Base To Thigh    Amb Referral to Nutrition and Diabetic E    Ambulatory referral to Social Work    HISTORY OF PRESENT ILLNESS: Chad Sparks. is a 67 y.o. male, initially seen in consult on 05/25/16 at the request of Dr. Gwenette Greet at the [facility] for a putative diagnosis of right lung cancer. The patient presented to his PCP with a cough and 20 pound weight loss in November 2017.  On further evaluation, he was found to have a 5 cm mass in the right lower lobe on CT scan in December 2017. He has a history of cirrhosis and has ascites and esophageal varices as well as a 6 mm density at the pancreatic head. He had a bronchoscopy with EBUS in January 2018 revealing mild narrowing of the medial basilar segment of the right lower lobe with no endobronchial lesions identified and no significant adenopathy by ultrasound.  Cytology/final pathology from brushings and biopsy were nondiagnostic. A PET scan on 04/01/2016 revealed a hypermetabolic mass within the right lower lobe with an SUV of 6, faint right hilar activity without discrete adenopathy, and a new right effusion with low-grade hypermetabolic activity felt to be consistent with history of cirrhosis. 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 Pleural fluid seen on PET was unable to be aspirated  successfully due insufficient fluid at the time of thoracentesis. 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 Therefore, he was referred to our office to discuss options for definitive radiotherapy without tissue diagnosis.  MRI brain on 06/11/16 was negative for metastatic disease.  The decision at the time of his initial consult was to proceed with definitive SBRT to the putative stage IIb RLL lesion.  He returned for CT SIM/radiation planning on 06/11/16 but failed to show for scheduled treatments due to transportation issues.  Multiple attempts were made by social work to contact the patient and assist with transportation but the contact numbers listed in the patient's chart were no longer valid and the patient was unable to be reached.  He has continued in follow up at the [place] but has not had any recent imaging studies.    He has been referred back today, to again discuss radiotherapy treatment options in the management of his disease.  He presents today with his Chad Sparks whom he has helping him in the home 3hrs/day.       PREVIOUS RADIATION THERAPY: No  PAST MEDICAL HISTORY:  Past Medical History:  Diagnosis Date  . Alcohol abuse   . Allergy   . Chronic kidney disease   . COPD (chronic obstructive pulmonary disease) ([place])   . Emphysema of lung ([place])   . Heavy cigarette smoker   . Hepatitis C   . Lung cancer ([place])       PAST SURGICAL HISTORY: Past Surgical History:  Procedure Laterality Date  . ESOPHAGOGASTRODUODENOSCOPY (EGD) WITH PROPOFOL N/A 10/15/2014   Procedure: ESOPHAGOGASTRODUODENOSCOPY (EGD) WITH PROPOFOL;  Surgeon: Wonda Horner, MD;  Location: [facility] ENDOSCOPY;  Service: Endoscopy;  Laterality: N/A;    FAMILY HISTORY:  Family History  Problem Relation Age of Onset  . Throat cancer Mother   . Cancer Mother        throat  . Emphysema Father   . Brain cancer Brother   . Cancer Brother        brain  . Cancer  Paternal Grandmother        leukemia    SOCIAL HISTORY:  Social History   Socioeconomic History  . Marital status: Single    Spouse name: Not on file  . Number of children: Not on file  . Years of education: Not on file  . Highest education level: Not on file  Social Needs  . Financial resource strain: Not on file  . Food insecurity - worry: Not on file  . Food insecurity - inability: Not on file  . Transportation needs - medical: Not on file  . Transportation needs - non-medical: Not on file  Occupational History  . Not on file  Tobacco Use  . Smoking status: Current Every Day Smoker    Packs/day: 0.50    Years: 53.00    Pack years: 26.50    Types: Cigarettes  . Smokeless tobacco: Never Used  Substance and Sexual Activity  . Alcohol use: Yes    Comment:  2-4 beers a month.  . Drug use: No  . Sexual activity: No  Other Topics Concern  . Not on file  Social History Narrative  . 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 albuterol (PROVENTIL HFA;VENTOLIN HFA) 108 (90 Base) MCG/ACT inhaler Inhale 2 puffs into the lungs every 6 (six) hours as needed for wheezing or shortness of breath. (Patient not taking: Reported on 05/25/2016) 1 Inhaler 3  . fluticasone (FLOVENT HFA) 110 MCG/ACT inhaler Inhale 2 puffs into the lungs 2 (two) times daily. (Patient not taking: Reported on 05/25/2016) 1 Inhaler 3  . gabapentin (NEURONTIN) 100 MG capsule Take 1 capsule (100 mg total) by mouth at bedtime. (Patient not taking: Reported on 05/25/2016) 30 capsule 0  . ibuprofen (ADVIL) 200 MG tablet Take 200 mg by mouth every 6 (six) hours as needed.    Marland Kitchen ipratropium (ATROVENT HFA) 17 MCG/ACT inhaler Inhale 2 puffs into the lungs every 6 (six) hours as needed for wheezing. (Patient not taking: Reported on 05/25/2016) 1 Inhaler 12  . metoCLOPramide (REGLAN) 10 MG tablet Take 1 tablet (10 mg total) by mouth 3  (three) times daily before meals. (Patient not taking: Reported on 05/25/2016) 30 tablet 0  . omeprazole (PRILOSEC) 20 MG capsule Take 1 capsule (20 mg total) by mouth daily. (Patient not taking: Reported on 05/25/2016) 30 capsule 3  . tamsulosin (FLOMAX) 0.4 MG CAPS capsule Take 1 capsule (0.4 mg total) by mouth daily. (Patient not taking: Reported on 05/25/2016) 30 capsule 3   No current facility-administered medications for this encounter.     REVIEW OF SYSTEMS:  On review of systems, the patient reports that he is doing well overall. He denies any chest pain, fevers, chills, night sweats. He reports a chronic non productive cough and SOB with mild exertion. His cough is not any worse in the morning or evening. He denies hemoptysis, or  dysphagia. He reports his healthy weight was 205 lb and currently, the patient weighs 147 lb today. He continues with poor appetite and reports that some days it is decent but sometimes he doesn't eat for two to three days at a time. He does report using Ensure supplements on occasion but not regularly.  He denies any bowel or bladder disturbances, and denies abdominal pain, or nausea. He continues to experience calf pain when walking and improved with rest. He has fallen several times while in the shower but, hasn't sustained any injuries. He reports his feet are swollen and his calves feel hard, and reports these symptoms have persisted for over a year and unchanged recently. He uses a rolling walker to ambulate secondary to these symptoms. He is using Emu spray for pain and numbness in the left arm into the hand/fingers which resolves these sxs. He also reports pain in the left shoulder and generalized weakness. He has been unable to see from his right eye at least a year. He denies headaches or dizziness. He denies arthritis in his neck, reporting only arthritis in his hands. A complete review of systems is obtained and is otherwise negative.  PHYSICAL EXAM:  Wt Readings  from Last 3 Encounters:  03/04/17 147 lb 6.4 oz (66.9 kg)  05/25/16 153 lb 12.8 oz (69.8 kg)  04/25/15 159 lb 9.6 oz (72.4 kg)   Temp Readings from Last 3 Encounters:  03/04/17 98.4 F (36.9 C) (Oral)  09/11/16 98.7 F (37.1 C) (Oral)  05/25/16 98 F (36.7 C) (Oral)   BP Readings from Last 3 Encounters:  03/04/17 117/86  09/11/16 118/75  05/25/16 116/75   Pulse Readings from Last 3 Encounters:  03/04/17 (!) 103  09/11/16 (!) 110  05/25/16 (!) 103   Pain Assessment Pain Score: 0-No pain/10  In general this is a cachectic appearing African-American male in no acute distress. He uses a rolling walker to ambulate. He is alert and oriented x4 and appropriate throughout the examination. HEENT reveals that the patient is normocephalic, atraumatic. EOMs are intact. PERRLA. Skin is intact without any evidence of gross lesions. Cardiovascular exam reveals a regular rate and rhythm, no clicks rubs or murmurs are auscultated. Chest is clear to auscultation bilaterally with decreased breath sounds in the bilateral lung bases. Lymphatic assessment is performed and does not reveal any palpable adenopathy in the cervical or supraclavicular chains. Abdomen has active bowel sounds in all quadrants and is intact. The abdomen is soft, non tender, non distended. Lower extremities are negative for pretibial pitting edema, deep calf tenderness, cyanosis or clubbing.  KPS = 60  100 - Normal; no complaints; no evidence of disease. 90   - Able to carry on normal activity; minor signs or symptoms of disease. 80   - Normal activity with effort; some signs or symptoms of disease. 49   - Cares for self; unable to carry on normal activity or to do active work. 60   - Requires occasional assistance, but is able to care for most of his personal needs. 50   - Requires considerable assistance and frequent medical care. 25   - Disabled; requires special care and assistance. 38   - Severely disabled; hospital  admission is indicated although death not imminent. 59   - Very sick; hospital admission necessary; active supportive treatment necessary. 10   - Moribund; fatal processes progressing rapidly. 0     - Dead  Karnofsky DA, Abelmann WH, Craver LS and Burchenal [facility] [phone])  The use of the nitrogen mustards in the palliative treatment of carcinoma: with particular reference to bronchogenic carcinoma Cancer 1 634-56  LABORATORY DATA:  Lab Results  Component Value Date   WBC 4.8 04/25/2015   HGB 10.5 (L) 04/25/2015   HCT 32.3 (L) 04/25/2015   MCV 82.5 04/25/2015   PLT 112.0 (L) 04/25/2015   Lab Results  Component Value Date   NA 135 01/31/2015   K 3.8 01/31/2015   CL 106 01/31/2015   CO2 24 01/31/2015   Lab Results  Component Value Date   ALT 16 01/31/2015   AST 42 (H) 01/31/2015   ALKPHOS 79 01/31/2015   BILITOT 1.4 (H) 01/31/2015     RADIOGRAPHY: No results found.    IMPRESSION/PLAN: 1. 67 y.o.  gentleman with putative Stage IIB, T2bN0M0 NSCLC, of the right lower lobe. We spoke with the patient about the findings and work-up thus far as well as the indication for repeat imaging due to significant delay in starting treatment. We discussed the natural history of non-small cell lung cancer and general treatment, highlighting the role of radiotherapy in the management.  We briefly discussed the available radiation techniques, and focused on the details of logistics and delivery.  Treatment recommendations will be based on findings on current PET imaging once this has been completed.  We reviewed the anticipated acute and late sequelae associated with SBRTversus image guided conventional radiotherapy over 6 1/2 weeks.  We will coordinate a PET scan for disease restaging in the next week and plan to see him back in the office in follow up thereafter to review results and formalize treatment recommendations. 2. Persistent weight loss and decreased appetite. We discussed a nutrition consultation.  The patient is agreeable to a referral to nutrition at this time.  3. Transportation issues.  We will re-connect the patient with social work to assist with transportation to and from his visits to the cancer center.  Social work referral has been made. 4. Tobacco use. The patient continues to smoke cigarettes. We discussed smoking cessation and will continue to discuss this at future visits.    Nicholos Johns, PA-C    Tyler Pita, MD  [facility] Direct Dial: [phone]  Fax: [phone] [email or web address]  Skype  LinkedIn

## 2017-03-08 ENCOUNTER — Encounter: Payer: Self-pay | Admitting: *Deleted

## 2017-03-08 ENCOUNTER — Inpatient Hospital Stay: Payer: Non-veteran care | Attending: Urology | Admitting: Nutrition

## 2017-03-08 NOTE — Progress Notes (Signed)
67 year old male diagnosed with lung cancer.  He is a patient of Dr. Tammi Klippel.  Patient has not started any treatment yet but says he will have radiation therapy.  Past medical history includes GI bleed, hepatitis C, COPD, tobacco, alcohol, chronic kidney disease and anemia.  Medications: Patient reports he does not take any medications.  Labs were reviewed.  Height: 6 feet 2 inches. Weight: 147.4 pounds. Usual body weight: 162.6 pounds one year ago.  Patient reports he weighed 205 pounds when he felt healthy. Current BMI: 18.93.(Underweight)  Patient arrives to nutrition consult in wheelchair with his sister. Patient reports he lives with another sister who does a lot of the cooking. Patient reports he only eats when he feels hungry.  Patient may go a day or more at a time without eating. Patient states he likes ensure plus.  However, he cannot afford it. Patient also reports food insecurity. Patient is underweight and cachectic in appearance. Physical exam was deferred.  Nutrition diagnosis:  Underweight related to inadequate oral intake as evidenced by BMI of 18.93.  Intervention: Patient was educated to consume 3 meals daily and increase Ensure Plus 3 times a day between meals. Provided patient with one complementary case of Ensure Plus Provided patient with complementary food from our food bank. Educated patient on sources of calories and protein and provided fact sheets. Worked with patient's sister to provide meals for patient that he can microwave when they are work. Questions were answered.  Teach back method used.  Fact sheets were provided.  Contact information was given.  Monitoring, evaluation, goals: Patient will tolerate increased calories and protein to promote weight gain.  Next visit: To be scheduled as needed.  **Disclaimer: This note was dictated with voice recognition software. Similar sounding words can inadvertently be transcribed and this note may contain  transcription errors which may not have been corrected upon publication of note.**

## 2017-03-08 NOTE — Progress Notes (Signed)
[place] Work  Holiday representative met with patient and patients family in [place] office at [facility] to offer support and review proposed plan from [place].  Patient was able to [place] transportation today with the help of his home care aide and sister.  [facility] CSW would like to explore SNF placement during patients radiation treatment.  CSW explored that option with patient.  Patient was agreeable to pursuing SNF placement.  Patient signed [place] ROI to [facility] for review.  ROI and VA Screening Checklist were given to Medical records and will be faxed to [facility] contact.  [facility] CSW will pursue SNF placement once records are received.  CSW, patient, and patients sister discussed a "back up" plan for transportation to treatment if snf placement was not successful.  With the combination of patients aide and sisters it is possible treatment scheduled could be arranged so patient would have help getting from his door step to the medical transportation Lucianne Lei and when he returned home from treatment.  Both patient and patient sister felt this would also be a reasonable plan.  CSW provided contact information.  Patient and family know to call CSW with questions or concerns.    Johnnye Lana, MSW, LCSW, OSW-C Clinical Social Worker [facility] [phone]

## 2017-03-10 ENCOUNTER — Encounter: Payer: Self-pay | Admitting: General Practice

## 2017-03-10 NOTE — Progress Notes (Signed)
Thoracic Location of Tumor / Histology:5 cmNeoplasm of RLL lungwith enhancing subcarinal lymph nodes  FUN  to review  03-17-17 NM PET Image Restag (PS) Skull Base To Thigh  IMPRESSION: 03-17-17 1. Significant progression of right lower lobe primary with development of pulmonary, thoracic nodal, and probable right pleural metastasis. 2. No evidence of hypermetabolic extra thoracic metastatic disease. 3. Similar moderate volume ascites. Suspect mild cirrhosis. 4. Cholelithiasis. 5. Coronary artery atherosclerosis. Aortic Atherosclerosis  Patient presentedwith a chronic, non productive cough for the past 4-5 months.  Received 03-12-16 completed 04-01-16   Tobacco/Marijuana/Snuff/ETOH RVU:YEBX 11/8/17but resumed smoking. Reports 1 pack of cigarette will last him three days.  Past/Anticipated interventions by cardiothoracic surgery, if any:No  Past/Anticipated interventions by medical oncology, if any:No  Signs/Symptoms  Weight changes, if IDH:WYSHUOH his healthy weight was 205 lb. Patient weighs 148.2 lb today. Gained 1 pound 2 oz.  Respiratory complaints, if FGB:MSXJDBZ achronic non productive coughand SOB with mild exertion. Denies chest pain or difficulty swallowing.  Hemoptysis, if any:No  Pain issues, if MCE:YEMVV 8/10 "states he is not taking any pain medication",Calves hurt when he walks ongoing. Reports his feet are swollen and his calves feel hard to touch. Reports these symptoms have persisted for over a year.  Wt Readings from Last 3 Encounters:  03/19/17 148 lb 3.2 oz (67.2 kg)  03/04/17 147 lb 6.4 oz (66.9 kg)  05/25/16 153 lb 12.8 oz (69.8 kg)   SAFETY ISSUES:History of falls has not fallen in the past four months  Prior radiation?No  Pacemaker/ICD?No  Possiblecurrent pregnancy?N/A  Is the patient on methotrexate?No  Current Complaints / other details:66 year old male.Reports he was exposed to agent orange. Accompanied  today by his home health aide. Reports he has been unable to see from his right eye x 11 months. Denies headaches, dizziness, nausea or vomiting. 03-04-17  6 pound weight loss 6.4 oz. since his last visit states he is eating. BP (!) 131/95 (BP Location: Left Arm, Patient Position: Sitting, Cuff Size: Normal)   Pulse (!) 103   Temp 98.5 F (36.9 C) (Oral)   Resp 18   Ht 6\' 2"  (1.88 m)   Wt 148 lb 3.2 oz (67.2 kg)   SpO2 100%   BMI 19.03 kg/m

## 2017-03-10 NOTE — Progress Notes (Signed)
Complex Case Management Note   Does the patient have a legal guardian (if yes, please add name and contact information of guardian):  No.   Has guardianship paperwork been requested/received?: No.   Does the patient have an IDD diagnosis?: No.   Natural Supports (and contact information if applicable):  Sisters Vera and [place]. Patient has allowed contact w both parties, lives w sister Vanita Ingles.    Outside agencies involved: [facility] provides support in community. Social worker:  Glean Hess [phone] x [phone] (if unable to reach, contact supervisor Gwenyth Bouillon [phone] x [phone] [facility] RN in charge:  Su Grand [phone]) Beneficiary transportation requests:  [phone]  Medical needs/complications (eg. 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 Name of facility, contact, and date of referral):  Name of Facility Contact Information Date of referral What else is needed?                        Other:  03/05/16:  CSW spoke w [place], reviewed difficulty of getting patient to scheduled appointments which has resulted in delay of care; advised that patient uses wheelchair for mobility, has no wheelchair ramp in the home.  Is eligible for VA transport which is threshold to threshold - pt must be able to get in/out of the house by himself or with caregiver assistance.  Transport will not enter the home to get patient out of the home.  VA CSW will investigate various options to assist including wheelchair ramp for sisters condo, asking for additional aide time so patient can be successfully cared for as his needs have changes (currently approved for 15 hours/week), requesting placement in SNF for duration  of radiation treatment to overcome barriers of access to the home.  VA CSW will explore these options and advise which are feasible under patient insurance coverage.  Asks that appointments for patient be scheduled in the early AM so that sister can assist w getting patient out of the home and into [place] transport vehicle - then aide can be scheduled to assist w getting patient into the home after treatments.  CSW will communicate this request to the treatment team/scheduling.  VA CSW will speak w sister, patient and VA resources in order to work on coordinating care for patient.    03/10/17:  Request for SNF placement during course of treatment denied by VA as "he is not a good rehab candidate, he needs to finish his course of treatment."  [place] has contacted sister to discuss possibility of installing ramp at home in order to ease difficulties of getting patient in/out of home.  CSW called [facility] -they are unable to accommodate patient during treatment because he is not fully independent in ADLs. VA will be working on options to facilitate patient access to treatment.  Current plan is for patient to remain at sisters house, have AM appointments so VA transport can pick up approx 8 AM and sister can assist w getting into [place] w home health aide at home to receive patient when dropped off after treatment.  VA CSW has been asked to explore option of increasing aide time due to anticipated increased care needs of patient.  VA has also put in palliative  care consult through their system.  CSWs continuing to monitor.    Edwyna Shell, LCSW Clinical Social Worker Phone:  [place], [place] 03/05/2017 9:39 AM

## 2017-03-11 ENCOUNTER — Encounter: Payer: Self-pay | Admitting: General Practice

## 2017-03-11 NOTE — Progress Notes (Signed)
[place] CSW Progress Note  Call from patient, requests contact # for [place] transport scheduler, number provided.  CSW also left this information on sister's VM to ensure the family has this information as well.  Patient states he will be scheduling his transport for upcoming appointments.  Edwyna Shell, LCSW Clinical Social Worker Phone:  [phone]

## 2017-03-12 ENCOUNTER — Encounter: Payer: Self-pay | Admitting: General Practice

## 2017-03-12 NOTE — Progress Notes (Signed)
[place] CSW Progress Note]  Call from patient, states he is having difficulty working w the [employer], could not remember the contact #.  Number provided to both patient and sister [name], encouraged to set up transport for upcoming appointments.  Also notified [facility] CSW that patient is having difficulty w arrangements.  Edwyna Shell, LCSW Clinical Social Worker Phone:  [phone]

## 2017-03-17 ENCOUNTER — Encounter: Payer: Self-pay | Admitting: General Practice

## 2017-03-17 ENCOUNTER — Encounter (HOSPITAL_COMMUNITY)
Admission: RE | Admit: 2017-03-17 | Discharge: 2017-03-17 | Disposition: A | Discharge status: Home / Self Care | Payer: Non-veteran care | Source: Ambulatory Visit | Attending: Urology | Admitting: Urology

## 2017-03-17 DIAGNOSIS — C3431 Malignant neoplasm of lower lobe, right bronchus or lung: Secondary | ICD-10-CM | POA: Insufficient documentation

## 2017-03-17 LAB — GLUCOSE, CAPILLARY: Glucose-Capillary: 82 mg/dL (ref 65–99)

## 2017-03-17 MED ORDER — FLUDEOXYGLUCOSE F - 18 (FDG) INJECTION
7.2600 | Freq: Once | INTRAVENOUS | Status: AC | PRN
Start: 2017-03-17 — End: 2017-03-17
  Administered 2017-03-17: 7.26 via INTRAVENOUS

## 2017-03-17 NOTE — Progress Notes (Signed)
[place] CSW Progress Note  Call from patient requesting contact information for [place] transport provider, number provided.  States he has been able to use service successfully for appointment this week.  Needs to schedule future appointments for transport.  Edwyna Shell, LCSW Clinical Social Worker Phone:  [phone]

## 2017-03-19 ENCOUNTER — Ambulatory Visit
Admission: RE | Admit: 2017-03-19 | Discharge: 2017-03-19 | Disposition: A | Discharge status: Home / Self Care | Payer: Non-veteran care | Source: Ambulatory Visit | Attending: Radiation Oncology | Admitting: Radiation Oncology

## 2017-03-19 ENCOUNTER — Encounter: Payer: Self-pay | Admitting: Urology

## 2017-03-19 ENCOUNTER — Ambulatory Visit
Admission: RE | Admit: 2017-03-19 | Discharge: 2017-03-19 | Disposition: A | Discharge status: Home / Self Care | Payer: Non-veteran care | Source: Ambulatory Visit | Attending: Urology | Admitting: Urology

## 2017-03-19 ENCOUNTER — Other Ambulatory Visit: Payer: Self-pay

## 2017-03-19 VITALS — BP 131/95 | HR 103 | Temp 98.5°F | Resp 18 | Ht 74.0 in | Wt 148.2 lb

## 2017-03-19 DIAGNOSIS — C3431 Malignant neoplasm of lower lobe, right bronchus or lung: Secondary | ICD-10-CM | POA: Diagnosis not present

## 2017-03-19 NOTE — Progress Notes (Signed)
Radiation Oncology         [phone]) [phone] ________________________________  Outpatient Follow up visit  Name: Chad Sparks. 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 Primary cancer of right lower lobe of lung (HCC) C34.31     HISTORY OF PRESENT ILLNESS: Chad Sparks. is a 67 y.o. male, initially seen in consult on 05/25/16 at the request of Dr. Gwenette Greet at the [facility] for a putative diagnosis of right lung cancer. The patient presented to his PCP with a cough and 20 pound weight loss in November 2017.  On further evaluation, he was found to have a 5 cm mass in the right lower lobe on CT scan in December 2017. He has a history of cirrhosis and has ascites and esophageal varices as well as a 6 mm density at the pancreatic head. He had a bronchoscopy with EBUS in January 2018 revealing mild narrowing of the medial basilar segment of the right lower lobe with no endobronchial lesions identified and no significant adenopathy by ultrasound.  Cytology/final pathology from brushings and biopsy were nondiagnostic. A PET scan on 04/01/2016 revealed a hypermetabolic mass within the right lower lobe with an SUV of 6, faint right hilar activity without discrete adenopathy, and a new right effusion with low-grade hypermetabolic activity felt to be consistent with history of cirrhosis. 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 Pleural fluid seen on PET was unable to be aspirated successfully due insufficient fluid at the time of thoracentesis. He does have  gastroesophageal varices as well making it very difficult to move forward with definitive biopsy. Therefore, he was referred to our office to discuss options for definitive radiotherapy without tissue diagnosis.  MRI brain on 06/11/16 was negative for metastatic disease.  The decision at the time of his initial consult was to proceed with definitive SBRT to the putative stage IIb RLL lesion.  He returned for CT SIM/radiation planning on 06/11/16 but failed to show for scheduled treatments due to transportation issues.  Multiple attempts were made by social work to contact the patient and assist with transportation but the contact numbers listed in the patient's chart were no longer valid and the patient was unable to be reached.  He had continued in follow up at the [place] but had not had any recent imaging studies.  He was referred back to [place] from the [place] on 03/04/17 and the decision at that time was to restage his disease with a repeat PET scan which was performed on 03/17/17.  Unfortunately, this scan shows significant disease progression with significant progression of right lower lobe primary with development of pulmonary, thoracic nodal, and probable right pleural metastasis but no evidence of hypermetabolic extra-thoracic metastatic disease.  He returns today, to review his recent PET results and to again discuss radiotherapy treatment options in the management of his disease.  He presents today with his [facility] whom he has helping him in the home 3hrs/day.       PREVIOUS RADIATION THERAPY: No  PAST MEDICAL HISTORY:  Past Medical History:  Diagnosis Date  . Alcohol abuse   .  Allergy   . Chronic kidney disease   . COPD (chronic obstructive pulmonary disease) ([place])   . Emphysema of lung ([place])   . Heavy cigarette smoker   . Hepatitis C   . Lung cancer ([place])       PAST SURGICAL HISTORY: Past Surgical History:  Procedure Laterality Date  . ESOPHAGOGASTRODUODENOSCOPY (EGD) WITH PROPOFOL N/A 10/15/2014     Procedure: ESOPHAGOGASTRODUODENOSCOPY (EGD) WITH PROPOFOL;  Surgeon: Wonda Horner, MD;  Location: [facility] ENDOSCOPY;  Service: Endoscopy;  Laterality: N/A;    FAMILY HISTORY:  Family History  Problem Relation Age of Onset  . Throat cancer Mother   . Cancer Mother        throat  . Emphysema Father   . Brain cancer Brother   . Cancer Brother        brain  . Cancer Paternal Grandmother        leukemia    SOCIAL HISTORY:  Social History   Socioeconomic History  . Marital status: Single    Spouse name: Not on file  . Number of children: Not on file  . Years of education: Not on file  . Highest education level: Not on file  Social Needs  . Financial resource strain: Not on file  . Food insecurity - worry: Not on file  . Food insecurity - inability: Not on file  . Transportation needs - medical: Not on file  . Transportation needs - non-medical: Not on file  Occupational History  . Not on file  Tobacco Use  . Smoking status: Current Every Day Smoker    Packs/day: 0.50    Years: 53.00    Pack years: 26.50    Types: Cigarettes  . Smokeless tobacco: Never Used  Substance and Sexual Activity  . Alcohol use: Yes    Comment:  2-4 beers a month.  . Drug use: No  . Sexual activity: No  Other Topics Concern  . Not on file  Social History Narrative  . 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 albuterol (PROVENTIL HFA;VENTOLIN HFA) 108 (90 Base) MCG/ACT inhaler Inhale 2 puffs into the lungs every 6 (six) hours as needed for wheezing or shortness of breath. (Patient not taking: Reported on 05/25/2016) 1 Inhaler 3  . fluticasone (FLOVENT HFA) 110 MCG/ACT inhaler Inhale 2 puffs into the lungs 2 (two) times daily. (Patient not taking: Reported on 05/25/2016) 1 Inhaler 3  . gabapentin (NEURONTIN) 100 MG capsule Take 1 capsule (100 mg total) by mouth at bedtime. (Patient not taking: Reported on  05/25/2016) 30 capsule 0  . ibuprofen (ADVIL) 200 MG tablet Take 200 mg by mouth every 6 (six) hours as needed.    Marland Kitchen ipratropium (ATROVENT HFA) 17 MCG/ACT inhaler Inhale 2 puffs into the lungs every 6 (six) hours as needed for wheezing. (Patient not taking: Reported on 05/25/2016) 1 Inhaler 12  . metoCLOPramide (REGLAN) 10 MG tablet Take 1 tablet (10 mg total) by mouth 3 (three) times daily before meals. (Patient not taking: Reported on 05/25/2016) 30 tablet 0  . omeprazole (PRILOSEC) 20 MG capsule Take 1 capsule (20 mg total) by mouth daily. (Patient not taking: Reported on 05/25/2016) 30 capsule 3  . tamsulosin (FLOMAX) 0.4 MG CAPS capsule Take 1 capsule (0.4 mg total) by mouth daily. (Patient not taking: Reported on 05/25/2016) 30 capsule 3   No current facility-administered medications for this encounter.  REVIEW OF SYSTEMS:  On review of systems, the patient reports that he is doing well overall. He denies any chest pain, fevers, chills, night sweats. He reports a chronic non productive cough and SOB with mild exertion. His cough is not any worse in the morning or evening. He denies hemoptysis, or dysphagia. He reports his healthy weight was 205 lb and currently, the patient weighs 147 lb today. He continues with poor appetite and reports that some days it is decent but sometimes he doesn't eat for two to three days at a time. He does report using Ensure supplements on occasion but not regularly.  He denies any bowel or bladder disturbances, and denies abdominal pain, or nausea. He continues to experience calf pain when walking and improved with rest. He has fallen several times while in the shower but, hasn't sustained any injuries. He reports his feet are swollen and his calves feel hard, and reports these symptoms have persisted for over a year and unchanged recently. He uses a rolling walker to ambulate secondary to these symptoms. He is using Emu spray for pain and numbness in the left arm into the  hand/fingers which resolves these sxs. He also reports pain in the left shoulder and generalized weakness. He has been unable to see from his right eye for the past 4 months and also has discomfort with movement of his left eye which started around the same time.  He has been evaluated for this at the [place] and has been referred to ophthalmology for further evalution.  He denies headaches (except when coughing) or dizziness. He denies arthritis in his neck, reporting only arthritis in his hands. A complete review of systems is obtained and is otherwise negative.  PHYSICAL EXAM:  Wt Readings from Last 3 Encounters:  03/19/17 148 lb 3.2 oz (67.2 kg)  03/04/17 147 lb 6.4 oz (66.9 kg)  05/25/16 153 lb 12.8 oz (69.8 kg)   Temp Readings from Last 3 Encounters:  03/19/17 98.5 F (36.9 C) (Oral)  03/04/17 98.4 F (36.9 C) (Oral)  09/11/16 98.7 F (37.1 C) (Oral)   BP Readings from Last 3 Encounters:  03/19/17 (!) 131/95  03/04/17 117/86  09/11/16 118/75   Pulse Readings from Last 3 Encounters:  03/19/17 (!) 103  03/04/17 (!) 103  09/11/16 (!) 110   Pain Assessment Pain Score: 8  Pain Loc: Hand(Hands have been hurting for years)/10  In general this is a cachectic appearing African-American male in no acute distress. He uses a rolling walker to ambulate. He is alert and oriented x4 and appropriate throughout the examination. HEENT reveals that the patient is normocephalic, atraumatic. EOMs are intact. PERRLA. The right eye is cloudy in appearance and he has no visual acuity on this side. Skin is intact without any evidence of gross lesions. Cardiovascular exam reveals a regular rate and rhythm, no clicks rubs or murmurs are auscultated. Chest is clear to auscultation bilaterally with decreased breath sounds in the bilateral lung bases. Lymphatic assessment is performed and does not reveal any palpable adenopathy in the cervical or supraclavicular chains. Abdomen has active bowel sounds in all  quadrants and is intact. The abdomen is soft, non tender, non distended. Lower extremities are negative for pretibial pitting edema, deep calf tenderness, cyanosis or clubbing.  KPS = 60  100 - Normal; no complaints; no evidence of disease. 90   - Able to carry on normal activity; minor signs or symptoms of disease. 80   - Normal activity with  effort; some signs or symptoms of disease. 22   - Cares for self; unable to carry on normal activity or to do active work. 60   - Requires occasional assistance, but is able to care for most of his personal needs. 50   - Requires considerable assistance and frequent medical care. 51   - Disabled; requires special care and assistance. 78   - Severely disabled; hospital admission is indicated although death not imminent. 32   - Very sick; hospital admission necessary; active supportive treatment necessary. 10   - Moribund; fatal processes progressing rapidly. 0     - Dead  Karnofsky DA, Abelmann Calvert, Craver LS and Burchenal [facility] [phone]) The use of the nitrogen mustards in the palliative treatment of carcinoma: with particular reference to bronchogenic carcinoma Cancer 1 634-56  LABORATORY DATA:  Lab Results  Component Value Date   WBC 4.8 04/25/2015   HGB 10.5 (L) 04/25/2015   HCT 32.3 (L) 04/25/2015   MCV 82.5 04/25/2015   PLT 112.0 (L) 04/25/2015   Lab Results  Component Value Date   NA 135 01/31/2015   K 3.8 01/31/2015   CL 106 01/31/2015   CO2 24 01/31/2015   Lab Results  Component Value Date   ALT 16 01/31/2015   AST 42 (H) 01/31/2015   ALKPHOS 79 01/31/2015   BILITOT 1.4 (H) 01/31/2015     RADIOGRAPHY: Nm Pet Image Restag (ps) Skull Base To Thigh  Result Date: 03/17/2017 CLINICAL DATA:  Subsequent treatment strategy for right lower lobe lung cancer. Non-small-cell. Restaging. EXAM: NUCLEAR MEDICINE PET SKULL BASE TO THIGH TECHNIQUE: 7.3 mCi F-18 FDG was injected intravenously. Full-ring PET imaging was performed from the skull base  to thigh after the radiotracer. CT data was obtained and used for attenuation correction and anatomic localization. FASTING BLOOD GLUCOSE:  Value: 82 mg/dl COMPARISON:  04/01/2016 PET FINDINGS: NECK: No areas of abnormal hypermetabolism. Cerebral and cerebellar atrophy which is age advanced. No cervical adenopathy. Left carotid calcified atherosclerosis. CHEST: Progression of right lower lobe lung mass. Hypermetabolism now fills the majority of the right lower lobe, with surrounding airspace disease which is likely related to postobstructive pneumonitis. The central hypermetabolic component measures a S.U.V. max of 14.1 versus a S.U.V. max of 6.0 on the prior. Foci of more peripheral hypermetabolism about the right lung are likely pleural based and new. Example at a S.U.V. max of 4.7 including on approximately image 69/series 4. Bilateral hypermetabolic pulmonary nodules. A new anterior right lung base nodule measures 13 milli meters and a S.U.V. max of 2.9 on image 47/series 8. A left lower lobe pulmonary nodule is also new at 2.0 cm and a S.U.V. max of 5.5 on image 44/series 8. New hypermetabolic thoracic lymph nodes, including a subcarinal node which measures 11 mm a S.U.V. max of 7.0 on image 72/series 4. Lad coronary artery atherosclerosis. Small right pleural effusion is similar. Right lower lobe endobronchial obstruction is new. ABDOMEN/PELVIS: No abdominopelvic parenchymal or nodal hypermetabolism identified. 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 A 2.6 cm interpolar left renal cyst. Moderate volume ascites. Suspect mild cirrhosis. Small gallstones. Abdominal aortic atherosclerosis. SKELETON: No abnormal marrow activity. Interval partial healing of posterior left eleventh rib fracture. No correlate hypermetabolism. IMPRESSION: 1. Significant progression of right lower lobe primary with development of pulmonary, thoracic nodal, and probable right pleural metastasis.  2. No evidence of hypermetabolic extra thoracic metastatic disease. 3. Similar moderate volume ascites.  Suspect mild cirrhosis. 4. Cholelithiasis. 5. Coronary  artery atherosclerosis. Aortic Atherosclerosis (ICD10-I70.0). Electronically Signed   By: Abigail Miyamoto M.D.   On: 03/17/2017 11:20      IMPRESSION/PLAN: 1. 67 y.o.  gentleman with evidence of significant disease progression of primary lung disease, now appears to have stage IV [id number] presumed adenocarcinoma of the right lower lung.  Dr. Tammi Klippel and I spoke with the patient about the findings and work-up thus far. We discussed the natural history of locally advanced lung cancer and general treatment, highlighting the role of radiotherapy in the management.  We discussed the need for tissue confirmation and recommend proceeding with repeat CT guided core biopsy of the large RLL lesion.  Treatment recommendations will be based on final pathology once his biopsy has been completed.  We will coordinate a CT guided biopsy for tissue confirmation in the next week and plan to see him back in the office in follow up thereafter to review results and formalize treatment recommendations. 2. Persistent weight loss and decreased appetite. He met with our dietician, Ernestene Kiel on 03/08/2017 and recommendations were 2 consume 3 meals daily and increase his Ensure Plus supplements 3 times a day between meals. He was provided with Ensure Plus samples as well as he will continue to follow-up as needed for additional resources and counseling. 3. Transportation issues.  Social work is working with the patient through the [place] to assist with transportation to and from his visits to the cancer center.  Ongoing assistance is appreciated. 4. Tobacco use. The patient continues to smoke cigarettes. We discussed smoking cessation and will continue to discuss this at future visits. 5. Visual changes.  We will consider proceeding with repeat MRI of the brain pending final  pathology results.    Nicholos Johns, PA-C    Tyler Pita, MD  [facility] Direct Dial: [phone]  Fax: [phone] [email or web address]  Skype  [email or web address]

## 2017-03-22 ENCOUNTER — Encounter: Payer: Self-pay | Admitting: Nutrition

## 2017-03-22 NOTE — Progress Notes (Signed)
Provided second case of Ensure Plus.

## 2017-03-24 ENCOUNTER — Encounter: Payer: Self-pay | Admitting: General Practice

## 2017-03-24 ENCOUNTER — Other Ambulatory Visit: Payer: Self-pay | Admitting: Radiology

## 2017-03-24 NOTE — Progress Notes (Signed)
[facility] CSW Progress Note  CSW received report that patient is experiencing transportation difficulties - patient has [place] transport available for all appointments and needs to schedule himself.  CSW called Gwenyth Bouillon, [place] supervisor, asked for call to discuss how to best support this patient in getting to his treatment appointments.  Awaiting call back.  Edwyna Shell, LCSW Clinical Social Worker Phone:  [phone]

## 2017-03-25 ENCOUNTER — Encounter: Payer: Self-pay | Admitting: General Practice

## 2017-03-25 NOTE — Progress Notes (Signed)
[facility] CSW Progress Note  CSW received call from [employer], Ship broker for [place].  States that [place] is aware that lack of ramp at patient's home is barrier to transport as he is in wheelchair and cannot get down set of 3 steps to parking lot where wheelchair Lucianne Lei is ready to pick him up.  Family and/or caregiver has been able to assist for previous appointments, appointments need to be scheduled for as early as possible in AM so family can assist then go to work.  Caregiver can be at home for return trip.  [facility] to have [facility] for [facility] of wheelchair ramp - appt is not scheduled until Feb.  Main CSW Maggie Southern is on leave.  Current needs can be addressed via Gwenyth Bouillon [phone]) or Jola Baptist [phone]).  Left VM for CSW [place] w time/date of patient's next appointment for biopsy.  Asked that she call to discuss how to get patient transport to this appointment.  Edwyna Shell, LCSW Clinical Social Worker Phone:  [phone]

## 2017-03-26 ENCOUNTER — Other Ambulatory Visit: Payer: Self-pay | Admitting: General Surgery

## 2017-03-26 ENCOUNTER — Encounter: Payer: Self-pay | Admitting: *Deleted

## 2017-03-26 NOTE — Progress Notes (Signed)
On 03-26-17 fax medical records to [place] [facility]

## 2017-03-29 ENCOUNTER — Encounter (HOSPITAL_COMMUNITY): Payer: Self-pay

## 2017-03-29 ENCOUNTER — Ambulatory Visit (HOSPITAL_COMMUNITY)
Admission: RE | Admit: 2017-03-29 | Discharge: 2017-03-29 | Disposition: A | Discharge status: Home / Self Care | Payer: Non-veteran care | Source: Ambulatory Visit | Attending: Interventional Radiology | Admitting: Interventional Radiology

## 2017-03-29 ENCOUNTER — Ambulatory Visit (HOSPITAL_COMMUNITY)
Admission: RE | Admit: 2017-03-29 | Discharge: 2017-03-29 | Disposition: A | Discharge status: Home / Self Care | Payer: Non-veteran care | Source: Ambulatory Visit | Attending: Urology | Admitting: Urology

## 2017-03-29 DIAGNOSIS — C3431 Malignant neoplasm of lower lobe, right bronchus or lung: Secondary | ICD-10-CM | POA: Diagnosis present

## 2017-03-29 DIAGNOSIS — J90 Pleural effusion, not elsewhere classified: Secondary | ICD-10-CM | POA: Insufficient documentation

## 2017-03-29 DIAGNOSIS — Z9889 Other specified postprocedural states: Secondary | ICD-10-CM

## 2017-03-29 LAB — APTT: aPTT: 50 seconds — ABNORMAL HIGH (ref 24–36)

## 2017-03-29 LAB — CBC
HEMATOCRIT: 30.2 % — AB (ref 39.0–52.0)
HEMOGLOBIN: 10.4 g/dL — AB (ref 13.0–17.0)
MCH: 31 pg (ref 26.0–34.0)
MCHC: 34.4 g/dL (ref 30.0–36.0)
MCV: 90.1 fL (ref 78.0–100.0)
Platelets: 124 10*3/uL — ABNORMAL LOW (ref 150–400)
RBC: 3.35 MIL/uL — AB (ref 4.22–5.81)
RDW: 15.8 % — ABNORMAL HIGH (ref 11.5–15.5)
WBC: 5.2 10*3/uL (ref 4.0–10.5)

## 2017-03-29 LAB — PROTIME-INR
INR: 1.89
Prothrombin Time: 21.5 seconds — ABNORMAL HIGH (ref 11.4–15.2)

## 2017-03-29 MED ORDER — FENTANYL CITRATE (PF) 100 MCG/2ML IJ SOLN
INTRAMUSCULAR | Status: AC | PRN
  Administered 2017-03-29: 50 ug via INTRAVENOUS
  Administered 2017-03-29 (×2): 25 ug via INTRAVENOUS

## 2017-03-29 MED ORDER — LIDOCAINE HCL 1 % IJ SOLN
INTRAMUSCULAR | Status: AC
  Filled 2017-03-29: qty 20

## 2017-03-29 MED ORDER — MIDAZOLAM HCL 2 MG/2ML IJ SOLN
INTRAMUSCULAR | Status: AC | PRN
  Administered 2017-03-29: 1 mg via INTRAVENOUS
  Administered 2017-03-29 (×2): 0.5 mg via INTRAVENOUS

## 2017-03-29 MED ORDER — MIDAZOLAM HCL 2 MG/2ML IJ SOLN
INTRAMUSCULAR | Status: AC
  Filled 2017-03-29: qty 4

## 2017-03-29 MED ORDER — SODIUM CHLORIDE 0.9 % IV SOLN
INTRAVENOUS | Status: DC
  Administered 2017-03-29: 11:00:00 via INTRAVENOUS

## 2017-03-29 MED ORDER — FENTANYL CITRATE (PF) 100 MCG/2ML IJ SOLN
INTRAMUSCULAR | Status: AC
  Filled 2017-03-29: qty 4

## 2017-03-29 NOTE — Sedation Documentation (Signed)
Patient is resting comfortably. 

## 2017-03-29 NOTE — Procedures (Signed)
RLL lung Bx 18 g times two R thora yields 300 cc EBL 0 Comp - Tiny PTX

## 2017-03-29 NOTE — Discharge Instructions (Addendum)
Moderate Conscious Sedation, Adult, Care After These instructions provide you with information about caring for yourself after your procedure. Your health care provider may also give you more specific instructions. Your treatment has been planned according to current medical practices, but problems sometimes occur. Call your health care provider if you have any problems or questions after your procedure. What can I expect after the procedure? After your procedure, it is common:  To feel sleepy for several hours.  To feel clumsy and have poor balance for several hours.  To have poor judgment for several hours.  To vomit if you eat too soon.  Follow these instructions at home: For at least 24 hours after the procedure:   Do not: ? Participate in activities where you could fall or become injured. ? Drive. ? Use heavy machinery. ? Drink alcohol. ? Take sleeping pills or medicines that cause drowsiness. ? Make important decisions or sign legal documents. ? Take care of children on your own.  Rest. Eating and drinking  Follow the diet recommended by your health care provider.  If you vomit: ? Drink water, juice, or soup when you can drink without vomiting. ? Make sure you have little or no nausea before eating solid foods. General instructions  Have a responsible adult stay with you until you are awake and alert.  Take over-the-counter and prescription medicines only as told by your health care provider.  If you smoke, do not smoke without supervision.  Keep all follow-up visits as told by your health care provider. This is important. Contact a health care provider if:  You keep feeling nauseous or you keep vomiting.  You feel light-headed.  You develop a rash.  You have a fever. Get help right away if:  You have trouble breathing. This information is not intended to replace advice given to you by your health care provider. Make sure you discuss any questions you have  with your health care provider. Document Released: 12/07/2012 Document Revised: 07/22/2015 Document Reviewed: 06/08/2015 Elsevier Interactive Patient Education  2018 [employer]. Needle Biopsy, Care After These instructions give you information about caring for yourself after your procedure. Your doctor may also give you more specific instructions. Call your doctor if you have any problems or questions after your procedure. Follow these instructions at home:  Rest as told by your doctor.  Take medicines only as told by your doctor.  There are many different ways to close and cover the biopsy site, including stitches (sutures), skin glue, and adhesive strips. Follow instructions from your doctor about: ? How to take care of your biopsy site. ? When and how you should change your bandage (dressing). ? When you should remove your dressing. ? Removing whatever was used to close your biopsy site.  Check your biopsy site every day for signs of infection. Watch for: ? Redness, swelling, or pain. ? Fluid, blood, or pus. Contact a doctor if:  You have a fever.  You have redness, swelling, or pain at the biopsy site, and it lasts longer than a few days.  You have fluid, blood, or pus coming from the biopsy site.  You feel sick to your stomach (nauseous).  You throw up (vomit). Get help right away if:  You are short of breath.  You have trouble breathing.  Your chest hurts.  You feel dizzy or you pass out (faint).  You have bleeding that does not stop with pressure or a bandage.  You cough up blood.  Your  belly (abdomen) hurts. This information is not intended to replace advice given to you by your health care provider. Make sure you discuss any questions you have with your health care provider. Document Released: 01/30/2008 Document Revised: 07/25/2015 Document Reviewed: 02/12/2014 Elsevier Interactive Patient Education  [employer].

## 2017-03-29 NOTE — Sedation Documentation (Signed)
Patient denies pain and is resting comfortably.  

## 2017-03-29 NOTE — H&P (Signed)
Chief Complaint: Patient was seen in consultation today for right lung mass biopsy at the request of Bruning,Ashlyn  Referring Physician(s): Bruning,Ashlyn Dr Gari Crown  Supervising Physician: Marybelle Killings  Patient Status: [facility] - Out-pt  History of Present Illness: Chad Sparks. is a 67 y.o. 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 Significant progression of right lower lobe primary with development of pulmonary, thoracic nodal, and probable right pleural metastasis. 2. No evidence of hypermetabolic extra thoracic metastatic disease. 3. Similar moderate volume ascites.  Suspect mild cirrhosis. 4. Cholelithiasis. 5. Coronary artery atherosclerosis. Aortic Atherosclerosis    Now scheduled for biopsy for tissue diagnosis and treatment of RLL mass To include thoracentesis if needed   Past Medical History:  Diagnosis Date  . Alcohol abuse   . Allergy   . Chronic kidney disease   . COPD (chronic obstructive pulmonary disease) ([place])   . Emphysema of lung ([place])   . Heavy cigarette smoker   . Hepatitis C   . Lung cancer [facility])     Past Surgical History:  Procedure Laterality Date  . ESOPHAGOGASTRODUODENOSCOPY (EGD) WITH PROPOFOL N/A 10/15/2014   Procedure: ESOPHAGOGASTRODUODENOSCOPY (EGD) WITH PROPOFOL;  Surgeon: Wonda Horner, MD;  Location: [facility] ENDOSCOPY;  Service: Endoscopy;  Laterality: N/A;    Allergies: Other  Medications: Prior to Admission  medications   Medication Sig Start Date End Date Taking? Authorizing Provider  albuterol (PROVENTIL HFA;VENTOLIN HFA) 108 (90 Base) MCG/ACT inhaler Inhale 2 puffs into the lungs every 6 (six) hours as needed for wheezing or shortness of breath. Patient not taking: Reported on 05/25/2016 04/25/15   Saguier, Percell Miller, PA-C  fluticasone (FLOVENT HFA) 110 MCG/ACT inhaler Inhale 2 puffs into the lungs 2 (two) times daily. Patient not taking: Reported on 05/25/2016 04/25/15   Saguier, Percell Miller, PA-C  gabapentin (NEURONTIN) 100 MG capsule Take 1 capsule (100 mg total) by mouth at bedtime. Patient not taking: Reported on 03/22/2017 02/06/15   Saguier, Percell Miller, PA-C  ipratropium (ATROVENT HFA) 17 MCG/ACT inhaler Inhale 2 puffs into the lungs every 6 (six) hours as needed for wheezing. Patient not taking: Reported on 05/25/2016 01/31/15   Saguier, Percell Miller, PA-C  metoCLOPramide (REGLAN) 10 MG tablet Take 1 tablet (10 mg total) by mouth 3 (three) times daily before meals. Patient not taking: Reported on 05/25/2016 02/20/15   Saguier, Percell Miller, PA-C  omeprazole (PRILOSEC) 20 MG capsule Take 1 capsule (20 mg total) by mouth daily. Patient not taking: Reported on 05/25/2016 01/31/15   Saguier, Percell Miller, PA-C  tamsulosin (FLOMAX) 0.4 MG CAPS capsule Take 1 capsule (0.4 mg total) by mouth daily. Patient not taking: Reported on 05/25/2016 02/20/15   Saguier, Percell Miller, PA-C     Family History  Problem Relation Age of Onset  . Throat cancer Mother   . Cancer Mother        throat  . Emphysema Father   . Brain cancer Brother   . Cancer Brother        brain  . Cancer Paternal Grandmother        leukemia    Social  History   Socioeconomic History  . Marital status: Single    Spouse name: None  . Number of children: None  . Years of education: None  . Highest education level: None  Social Needs  . Financial resource strain: None  . Food insecurity - worry: None  . Food insecurity - inability: None  . Transportation needs -  medical: None  . Transportation needs - non-medical: None  Occupational History  . None  Tobacco Use  . Smoking status: Current Every Day Smoker    Packs/day: 0.50    Years: 53.00    Pack years: 26.50    Types: Cigarettes  . Smokeless tobacco: Never Used  Substance and Sexual Activity  . Alcohol use: Yes    Comment:  2-4 beers a month.  . Drug use: No  . Sexual activity: No  Other Topics Concern  . None  Social History Narrative  . None    Review of Systems: A 12 point ROS discussed and pertinent positives are indicated in the HPI above.  All other systems are negative.  Review of Systems  Constitutional: Positive for activity change, appetite change, fatigue and unexpected weight change. Negative for fever.  Respiratory: Positive for cough and shortness of breath.   Cardiovascular: Negative for chest pain.  Gastrointestinal: Negative for abdominal pain.  Neurological: Positive for weakness.  Psychiatric/Behavioral: Negative for behavioral problems and confusion.    Vital Signs: BP 109/85 (BP Location: Right Arm)   Pulse 93   Temp 97.7 F (36.5 C) (Oral)   Ht 6' 2.5" (1.892 m)   Wt 140 lb (63.5 kg)   SpO2 97%   BMI 17.73 kg/m   Physical Exam  Constitutional: He is oriented to person, place, and time.  Cardiovascular: Normal rate, regular rhythm and normal heart sounds.  Pulmonary/Chest: Effort normal and breath sounds normal.  Abdominal: Soft. Bowel sounds are normal.  Musculoskeletal: Normal range of motion.  Uses WC--- weakness of legs  Neurological: He is alert and oriented to person, place, and time.  Skin: Skin is warm and dry.  Psychiatric: He has a normal mood and affect. His behavior is normal. Judgment and thought content normal.  Nursing note and vitals reviewed.   Imaging: Nm Pet Image Restag (ps) Skull Base To Thigh  Result Date: 03/17/2017 CLINICAL DATA:  Subsequent treatment strategy for right lower lobe lung cancer. Non-small-cell. Restaging.  EXAM: NUCLEAR MEDICINE PET SKULL BASE TO THIGH TECHNIQUE: 7.3 mCi F-18 FDG was injected intravenously. Full-ring PET imaging was performed from the skull base to thigh after the radiotracer. CT data was obtained and used for attenuation correction and anatomic localization. FASTING BLOOD GLUCOSE:  Value: 82 mg/dl COMPARISON:  04/01/2016 PET FINDINGS: NECK: No areas of abnormal hypermetabolism. Cerebral and cerebellar atrophy which is age advanced. No cervical adenopathy. Left carotid calcified atherosclerosis. CHEST: Progression of right lower lobe lung mass. Hypermetabolism now fills the majority of the right lower lobe, with surrounding airspace disease which is likely related to postobstructive pneumonitis. The central hypermetabolic component measures a S.U.V. max of 14.1 versus a S.U.V. max of 6.0 on the prior. Foci of more peripheral hypermetabolism about the right lung are likely pleural based and new. Example at a S.U.V. max of 4.7 including on approximately image 69/series 4. Bilateral hypermetabolic pulmonary nodules. A new anterior right lung base nodule measures 13 milli meters and a S.U.V. max of 2.9 on image 47/series 8. A left lower lobe pulmonary nodule is also new at 2.0 cm and  a S.U.V. max of 5.5 on image 44/series 8. New hypermetabolic thoracic lymph nodes, including a subcarinal node which measures 11 mm a S.U.V. max of 7.0 on image 72/series 4. Lad coronary artery atherosclerosis. Small right pleural effusion is similar. Right lower lobe endobronchial obstruction is new. ABDOMEN/PELVIS: No abdominopelvic parenchymal or nodal hypermetabolism identified. 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 A 2.6 cm interpolar left renal cyst. Moderate volume ascites. Suspect mild cirrhosis. Small gallstones. Abdominal aortic atherosclerosis. SKELETON: No abnormal marrow activity. Interval partial healing of posterior left eleventh rib fracture. No correlate  hypermetabolism. IMPRESSION: 1. Significant progression of right lower lobe primary with development of pulmonary, thoracic nodal, and probable right pleural metastasis. 2. No evidence of hypermetabolic extra thoracic metastatic disease. 3. Similar moderate volume ascites.  Suspect mild cirrhosis. 4. Cholelithiasis. 5. Coronary artery atherosclerosis. Aortic Atherosclerosis (ICD10-I70.0). Electronically Signed   By: Abigail Miyamoto M.D.   On: 03/17/2017 11:20    Labs:  CBC: Recent Labs    03/29/17 0942  WBC 5.2  HGB 10.4*  HCT 30.2*  PLT 124*    COAGS: No results for input(s): INR, APTT in the last 8760 hours.  BMP: Recent Labs    06/11/16 0831  CREATININE 1.20    LIVER FUNCTION TESTS: No results for input(s): BILITOT, AST, ALT, ALKPHOS, PROT, ALBUMIN in the last 8760 hours.  TUMOR MARKERS: No results for input(s): AFPTM, CEA, CA199, CHROMGRNA in the last 8760 hours.  Assessment and Plan:  RLL mass enlarging; + PET Possible pleural effusion Need tissue diagnosis Scheduled for right lung mass biopsy with possible thoracentesis Risks and benefits discussed with the patient including, but not limited to bleeding, hemoptysis, respiratory failure requiring intubation, infection, pneumothorax requiring chest tube placement, stroke from air embolism or even death. All of the patient's questions were answered, patient is agreeable to proceed. Consent signed and in chart.    Thank you for this interesting consult.  I greatly enjoyed meeting Dakwan Pridgen. and look forward to participating in their care.  A copy of this report was sent to the requesting provider on this date.  Electronically Signed: Lavonia Drafts, PA-C 03/29/2017, 10:34 AM   I spent a total of  30 Minutes   in face to face in clinical consultation, greater than 50% of which was counseling/coordinating care for right lung mass biopxy

## 2017-03-30 ENCOUNTER — Other Ambulatory Visit: Payer: Self-pay | Admitting: Urology

## 2017-03-30 ENCOUNTER — Encounter: Payer: Self-pay | Admitting: *Deleted

## 2017-03-30 DIAGNOSIS — C3431 Malignant neoplasm of lower lobe, right bronchus or lung: Secondary | ICD-10-CM

## 2017-03-30 NOTE — Progress Notes (Signed)
Oncology Nurse Navigator Documentation  Oncology Nurse Navigator Flowsheets 03/30/2017  Navigator Location [facility]  Referral date to RadOnc/MedOnc 03/30/2017  Navigator Encounter Type Other/I received a call from rad onc. They made referral on Chad Sparks. I updated new patient coordinator and asked patient to be scheduled on 04/07/17.    Treatment Phase Pre-Tx/Tx Discussion  Barriers/Navigation Needs Coordination of Care  Interventions Coordination of Care  Coordination of Care Other  Acuity Level 2  Acuity Level 2 Assistance expediting appointments  Time Spent with Patient 30

## 2017-03-31 ENCOUNTER — Encounter: Payer: Self-pay | Admitting: Internal Medicine

## 2017-03-31 ENCOUNTER — Telehealth: Payer: Self-pay | Admitting: Internal Medicine

## 2017-03-31 NOTE — Telephone Encounter (Signed)
Appt has been scheduled for the pt to see Dr. Julien Nordmann on 2/6 at 1130am. Pt wanted me to mail him a letter with the appt information.

## 2017-03-31 NOTE — Progress Notes (Signed)
Thank you!    Chad Sparks

## 2017-04-06 ENCOUNTER — Other Ambulatory Visit: Payer: Self-pay | Admitting: Medical Oncology

## 2017-04-06 DIAGNOSIS — C3431 Malignant neoplasm of lower lobe, right bronchus or lung: Secondary | ICD-10-CM

## 2017-04-07 ENCOUNTER — Ambulatory Visit: Payer: Non-veteran care | Admitting: Internal Medicine

## 2017-04-07 ENCOUNTER — Telehealth: Payer: Self-pay | Admitting: Medical Oncology

## 2017-04-07 ENCOUNTER — Ambulatory Visit
Admission: RE | Admit: 2017-04-07 | Discharge: 2017-04-07 | Disposition: A | Discharge status: Home / Self Care | Payer: Non-veteran care | Source: Ambulatory Visit | Attending: Urology | Admitting: Urology

## 2017-04-07 ENCOUNTER — Telehealth: Payer: Self-pay | Admitting: General Practice

## 2017-04-07 ENCOUNTER — Telehealth: Payer: Self-pay | Admitting: Urology

## 2017-04-07 ENCOUNTER — Other Ambulatory Visit: Payer: Non-veteran care

## 2017-04-07 ENCOUNTER — Encounter: Payer: Self-pay | Admitting: *Deleted

## 2017-04-07 DIAGNOSIS — C3431 Malignant neoplasm of lower lobe, right bronchus or lung: Secondary | ICD-10-CM

## 2017-04-07 NOTE — Telephone Encounter (Signed)
[place] CSW Progress Note  Return call from [employer], [place] CSW Supervisor.  Reports patient was seen at [facility] on 2/4 for Hep C.  Team determined that patient should "concentrate on his cancer treatment at this time",  Will not initiate Hep C treatment.  Stressed to [employer] that patient seems to be having difficulty negotiating care needs at home, requested assessment for higher level of support in the home.  CSW Coulter will communicate w patient's [place] PCP to address these concerns.  Edwyna Shell, LCSW Clinical Social Worker Phone:  [phone]

## 2017-04-07 NOTE — Telephone Encounter (Signed)
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  Unfortunately, he canceled his appointments in radiation oncology and medical oncology today due to "transportation issues".  He was coughing terribly during our conversation so I asked him if he felt like this was getting worse and he stated "it comes and goes".  However, on further questioning, he admits that on occasion he coughed so hard that he stops breathing and once he is able to breathe again he has a terrible headache that only last a few minutes and resolved spontaneously.  He denies passing out.  I reiterated the importance of getting in to see Dr. Earlie Server as soon as possible to discuss recommendations for treatment.  If Dr. Julien Nordmann feels that there is a role for radiation therapy, we are happy to continue to participate in his care.  We will stay in touch with Dr. Julien Nordmann and his team and coordinate any further care as indicated.  He appears to have a good understanding of his disease and my recommendations and states his compliance.  I have already communicated with Jacklynn Bue, RN, and she will be reaching out to him today to reschedule a consult visit with Dr. Julien Nordmann.   Nicholos Johns, PA-C

## 2017-04-07 NOTE — Telephone Encounter (Signed)
[place] CSW Progress Note  CSW spoke w patient, patient agreeable to sisters being informed of upcoming appointments so they can be aware of need for transportation scheduling.  Also requests information about his appointments be mailed to his home so he can schedule transportation.  Patient confirmed he is aware of how to use the [place] transport system, aware that he needs to call for rides w 2 day advance notice.   [facility] not seen [facility] in "quite a while."  CSW will reach out to [place] CSW and ask them to make home visit.  Patient conversation punctuated by bouts of significant cough.  Chad Shell, LCSW Clinical Social Worker Phone:  [phone]

## 2017-04-07 NOTE — Progress Notes (Signed)
[place] with Mr. Chad Sparks he state " I was not aware of my appointment for today and I mentioned to him if  He could make his 1130 appointment with Dr. Julien Nordmann and she said no".  I asked him to call Dr. Worthy Flank office to cancel his appointment and reschedule the appointment.  Mr. Chad Sparks shared that he needs  two days to arrange for transportation by the [place].  I told him I was sorry he was not aware of his two appointments for this morning and I would make Ashlyn Bruning, PA-C aware done. 68 Left a voice message for Dr. Julien Nordmann nurse about the above situation and instructions given to call their office at [phone] and ask for Dr. Worthy Flank office to discuss his situation.

## 2017-04-07 NOTE — Telephone Encounter (Signed)
Pt cancelled today due to transportation. He needs two day notice to arrange transportation with [facility].

## 2017-04-07 NOTE — Telephone Encounter (Signed)
[place] CSW Progress Notes  Spoke w sister Chad Sparks, "he needs some help with scheduling", "he doesn't know what time his appointments are or anything."  Sister Chad Sparks works nights and is willing to receive calls re appointments and scheduling.  Nurse navigator notified of family request.  CSW also left VM for [facility] CSW requesting they reach out to patient in community to check on his general welfare and access to resources.  VM left w [facility] CSW E Coulter.  Edwyna Shell, LCSW Clinical Social Worker Phone:  [phone]

## 2017-04-12 ENCOUNTER — Telehealth: Payer: Self-pay | Admitting: *Deleted

## 2017-04-12 NOTE — Telephone Encounter (Signed)
Oncology Nurse Navigator Documentation  Oncology Nurse Navigator Flowsheets 04/12/2017  Navigator Location [facility]  Navigator Encounter Type Telephone/I called patient's daughter to schedule him to be seen with Dr. Julien Nordmann. I was unable to reach her but did leave a vm message for her to call me.   Telephone Outgoing Call  Treatment Phase Pre-Tx/Tx Discussion  Barriers/Navigation Needs Coordination of Care  Interventions Coordination of Care  Coordination of Care Other  Acuity Level 1  Time Spent with Patient 15

## 2017-04-15 ENCOUNTER — Telehealth: Payer: Self-pay | Admitting: *Deleted

## 2017-04-15 ENCOUNTER — Telehealth: Payer: Self-pay | Admitting: General Practice

## 2017-04-15 NOTE — Telephone Encounter (Signed)
Oncology Nurse Navigator Documentation  Oncology Nurse Navigator Flowsheets 04/15/2017  Navigator Location [facility]  Navigator Encounter Type Telephone/I called patient's sister to set him up for an appt. I was unable to reach her. I did leave vm message to call with my name and phone number  Telephone Outgoing Call  Treatment Phase Pre-Tx/Tx Discussion  Barriers/Navigation Needs Coordination of Care  Interventions Coordination of Care  Coordination of Care Other  Acuity Level 1  Time Spent with Patient 15

## 2017-04-15 NOTE — Telephone Encounter (Signed)
[place] CSW Progress Note  Spoke w [facility] [employer], provided information on patient's upcoming appointments.  She will send message to [facility] transport re upcoming appointments.  Patient has also been approved for enrollment in [place] should begin soon, patient will have additional support services in the home, TBD based on assessment needs.  Edwyna Shell, LCSW Clinical Social Worker Phone:  [phone]

## 2017-04-15 NOTE — Telephone Encounter (Signed)
[place] CSW Progress Note  Call from sister Dreshaun Stene.  Appreciative that she and sister Vanita Ingles are being kept aware of appointments - "He just doesn't remember things any more, we need to know on the front end what's needed."  Sister [name] plans to meet patient at [place] for meeting w oncologist, has questions about patient's prognosis and treatment plan.  Has asked patient to arrange his transportation w [place] transport.  Patient will be travelling in wheelchair "which is too heavy for me to maneuver, I have a back problem."  Family is supportive and wants to be able to assist as needed.  Patient told sister that [facility] is coming to reevaluate his needs in the home - hopes more services will be authorized at that time.     Edwyna Shell, LCSW Clinical Social Worker Phone:  [phone]

## 2017-04-19 ENCOUNTER — Encounter: Payer: Self-pay | Admitting: *Deleted

## 2017-04-19 NOTE — Progress Notes (Signed)
Oncology Nurse Navigator Documentation  Oncology Nurse Navigator Flowsheets 04/19/2017  Navigator Location [facility]  Navigator Encounter Type Telephone/patient sister called and updated on appt with Dr. Julien Nordmann  Telephone Incoming Call  Treatment Phase Pre-Tx/Tx Discussion  Barriers/Navigation Needs Education  Education Other  Interventions Education  Acuity Level 1  Time Spent with Patient 15

## 2017-04-23 ENCOUNTER — Encounter: Payer: Self-pay | Admitting: *Deleted

## 2017-04-23 NOTE — Progress Notes (Signed)
Oncology Nurse Navigator Documentation  Oncology Nurse Navigator Flowsheets 04/23/2017  Navigator Location [facility]  Navigator Encounter Type Other/I received call from [place].  She updated me on his status and I updated her that Dr. Julien Nordmann will see him on 04/27/17.    Treatment Phase Pre-Tx/Tx Discussion  Barriers/Navigation Needs Coordination of Care  Interventions Coordination of Care  Coordination of Care Other  Acuity Level 2  Time Spent with Patient 30

## 2017-04-27 ENCOUNTER — Inpatient Hospital Stay: Payer: Non-veteran care | Attending: Urology | Admitting: Internal Medicine

## 2017-04-27 ENCOUNTER — Other Ambulatory Visit: Payer: Self-pay | Admitting: Medical Oncology

## 2017-04-27 ENCOUNTER — Inpatient Hospital Stay: Payer: Non-veteran care

## 2017-04-27 ENCOUNTER — Encounter: Payer: Self-pay | Admitting: Internal Medicine

## 2017-04-27 VITALS — BP 111/73 | HR 95 | Temp 97.7°F | Resp 17 | Ht 74.5 in | Wt 139.4 lb

## 2017-04-27 DIAGNOSIS — R630 Anorexia: Secondary | ICD-10-CM | POA: Insufficient documentation

## 2017-04-27 DIAGNOSIS — Z800 Family history of malignant neoplasm of digestive organs: Secondary | ICD-10-CM | POA: Diagnosis not present

## 2017-04-27 DIAGNOSIS — Z808 Family history of malignant neoplasm of other organs or systems: Secondary | ICD-10-CM | POA: Insufficient documentation

## 2017-04-27 DIAGNOSIS — J449 Chronic obstructive pulmonary disease, unspecified: Secondary | ICD-10-CM | POA: Insufficient documentation

## 2017-04-27 DIAGNOSIS — R5383 Other fatigue: Secondary | ICD-10-CM

## 2017-04-27 DIAGNOSIS — C3431 Malignant neoplasm of lower lobe, right bronchus or lung: Secondary | ICD-10-CM | POA: Insufficient documentation

## 2017-04-27 DIAGNOSIS — R634 Abnormal weight loss: Secondary | ICD-10-CM | POA: Insufficient documentation

## 2017-04-27 DIAGNOSIS — M6281 Muscle weakness (generalized): Secondary | ICD-10-CM | POA: Diagnosis not present

## 2017-04-27 DIAGNOSIS — C3491 Malignant neoplasm of unspecified part of right bronchus or lung: Secondary | ICD-10-CM

## 2017-04-27 DIAGNOSIS — N189 Chronic kidney disease, unspecified: Secondary | ICD-10-CM | POA: Diagnosis not present

## 2017-04-27 DIAGNOSIS — C3490 Malignant neoplasm of unspecified part of unspecified bronchus or lung: Secondary | ICD-10-CM

## 2017-04-27 DIAGNOSIS — M2550 Pain in unspecified joint: Secondary | ICD-10-CM | POA: Diagnosis not present

## 2017-04-27 DIAGNOSIS — Z79899 Other long term (current) drug therapy: Secondary | ICD-10-CM | POA: Diagnosis not present

## 2017-04-27 DIAGNOSIS — F17210 Nicotine dependence, cigarettes, uncomplicated: Secondary | ICD-10-CM | POA: Diagnosis not present

## 2017-04-27 DIAGNOSIS — Z806 Family history of leukemia: Secondary | ICD-10-CM | POA: Diagnosis not present

## 2017-04-27 LAB — CBC WITH DIFFERENTIAL (CANCER CENTER ONLY)
Basophils Absolute: 0 10*3/uL (ref 0.0–0.1)
Basophils Relative: 1 %
Eosinophils Absolute: 0 10*3/uL (ref 0.0–0.5)
Eosinophils Relative: 1 %
HEMATOCRIT: 31.9 % — AB (ref 38.4–49.9)
HEMOGLOBIN: 10.8 g/dL — AB (ref 13.0–17.1)
Lymphocytes Relative: 44 %
Lymphs Abs: 1.8 10*3/uL (ref 0.9–3.3)
MCH: 30.9 pg (ref 27.2–33.4)
MCHC: 33.9 g/dL (ref 32.0–36.0)
MCV: 91.1 fL (ref 79.3–98.0)
MONOS PCT: 17 %
Monocytes Absolute: 0.7 10*3/uL (ref 0.1–0.9)
NEUTROS ABS: 1.5 10*3/uL (ref 1.5–6.5)
NEUTROS PCT: 37 %
Platelet Count: 105 10*3/uL — ABNORMAL LOW (ref 140–400)
RBC: 3.5 MIL/uL — ABNORMAL LOW (ref 4.20–5.82)
RDW: 15.6 % — ABNORMAL HIGH (ref 11.0–14.6)
WBC Count: 4.1 10*3/uL (ref 4.0–10.3)

## 2017-04-27 LAB — CMP (CANCER CENTER ONLY)
ALBUMIN: 1.9 g/dL — AB (ref 3.5–5.0)
ALK PHOS: 84 U/L (ref 40–150)
ALT: 20 U/L (ref 0–55)
AST: 48 U/L — AB (ref 5–34)
Anion gap: 9 (ref 3–11)
BILIRUBIN TOTAL: 1.2 mg/dL (ref 0.2–1.2)
BUN: 7 mg/dL (ref 7–26)
CALCIUM: 8.6 mg/dL (ref 8.4–10.4)
CO2: 19 mmol/L — ABNORMAL LOW (ref 22–29)
CREATININE: 1 mg/dL (ref 0.70–1.30)
Chloride: 104 mmol/L (ref 98–109)
GFR, Est AFR Am: >60 mL/min (ref >60)
GFR, Estimated: >60 mL/min (ref >60)
GLUCOSE: 73 mg/dL (ref 70–140)
Potassium: 3.7 mmol/L (ref 3.5–5.1)
Sodium: 132 mmol/L — ABNORMAL LOW (ref 136–145)
TOTAL PROTEIN: 7.3 g/dL (ref 6.4–8.3)

## 2017-04-27 MED ORDER — PROCHLORPERAZINE MALEATE 10 MG PO TABS: 10.0000 mg | ORAL_TABLET | Freq: Four times a day (QID) | ORAL | 0 refills | Status: AC | PRN

## 2017-04-27 MED ORDER — FOLIC ACID 1 MG PO TABS: 1.0000 mg | ORAL_TABLET | Freq: Every day | ORAL | 4 refills | Status: AC

## 2017-04-27 MED ORDER — CYANOCOBALAMIN 1000 MCG/ML IJ SOLN
1000.0000 ug | Freq: Once | INTRAMUSCULAR | Status: AC
  Administered 2017-04-27: 1000 ug via INTRAMUSCULAR

## 2017-04-27 MED ORDER — DEXAMETHASONE 4 MG PO TABS: ORAL_TABLET | ORAL | 1 refills | Status: DC

## 2017-04-27 NOTE — Progress Notes (Signed)
START ON PATHWAY REGIMEN - Non-Small Cell Lung     A cycle is every 21 days:     Pembrolizumab      Pemetrexed      Carboplatin   **Always confirm dose/schedule in your pharmacy ordering system**    Patient Characteristics: Stage IV Metastatic, Nonsquamous, Initial Chemotherapy/Immunotherapy, PS = 0, 1, PD-L1 Expression Positive 1-49% (TPS) / Negative / Not Tested / Awaiting Test Results AJCC T Category: T3 Current Disease Status: Distant Metastases AJCC N Category: N2 AJCC M Category: M1c AJCC 8 Stage Grouping: IVB Histology: Nonsquamous Cell ROS1 Rearrangement Status: Awaiting Test Results T790M Mutation Status: Not Applicable - EGFR Mutation Negative/Unknown Other Mutations/Biomarkers: No Other Actionable Mutations PD-L1 Expression Status: Awaiting Test Results Chemotherapy/Immunotherapy LOT: Initial Chemotherapy/Immunotherapy Molecular Targeted Therapy: Not Appropriate ALK Translocation Status: Awaiting Test Results Would you be surprised if this patient died  in the next year<= I would NOT be surprised if this patient died in the next year EGFR Mutation Status: Awaiting Test Results BRAF V600E Mutation Status: Awaiting Test Results Performance Status: PS = 0, 1 Intent of Therapy: Non-Curative / Palliative Intent, Discussed with Patient

## 2017-04-27 NOTE — Patient Instructions (Signed)
Steps to Quit Smoking Smoking tobacco can be bad for your health. It can also affect almost every organ in your body. Smoking puts you and people around you at risk for many serious long-lasting (chronic) diseases. Quitting smoking is hard, but it is one of the best things that you can do for your health. It is never too late to quit. What are the benefits of quitting smoking? When you quit smoking, you lower your risk for getting serious diseases and conditions. They can include:  Lung cancer or lung disease.  Heart disease.  Stroke.  Heart attack.  Not being able to have children (infertility).  Weak bones (osteoporosis) and broken bones (fractures).  If you have coughing, wheezing, and shortness of breath, those symptoms may get better when you quit. You may also get sick less often. If you are pregnant, quitting smoking can help to lower your chances of having a baby of low birth weight. What can I do to help me quit smoking? Talk with your doctor about what can help you quit smoking. Some things you can do (strategies) include:  Quitting smoking totally, instead of slowly cutting back how much you smoke over a period of time.  Going to in-person counseling. You are more likely to quit if you go to many counseling sessions.  Using resources and support systems, such as: ? Online chats with a counselor. ? Phone quitlines. ? Printed self-help materials. ? Support groups or group counseling. ? Text messaging programs. ? Mobile phone apps or applications.  Taking medicines. Some of these medicines may have nicotine in them. If you are pregnant or breastfeeding, do not take any medicines to quit smoking unless your doctor says it is okay. Talk with your doctor about counseling or other things that can help you.  Talk with your doctor about using more than one strategy at the same time, such as taking medicines while you are also going to in-person counseling. This can help make  quitting easier. What things can I do to make it easier to quit? Quitting smoking might feel very hard at first, but there is a lot that you can do to make it easier. Take these steps:  Talk to your family and friends. Ask them to support and encourage you.  Call phone quitlines, reach out to support groups, or work with a counselor.  Ask people who smoke to not smoke around you.  Avoid places that make you want (trigger) to smoke, such as: ? Bars. ? Parties. ? Smoke-break areas at work.  Spend time with people who do not smoke.  Lower the stress in your life. Stress can make you want to smoke. Try these things to help your stress: ? Getting regular exercise. ? Deep-breathing exercises. ? Yoga. ? Meditating. ? Doing a body scan. To do this, close your eyes, focus on one area of your body at a time from head to toe, and notice which parts of your body are tense. Try to relax the muscles in those areas.  Download or buy apps on your mobile phone or tablet that can help you stick to your quit plan. There are many free apps, such as QuitGuide from the CDC (Centers for Disease Control and Prevention). You can find more support from smokefree.gov and other websites.  This information is not intended to replace advice given to you by your health care provider. Make sure you discuss any questions you have with your health care provider. Document Released: 12/13/2008 Document   Revised: 10/15/2015 Document Reviewed: 07/03/2014 Elsevier Interactive Patient Education  2018 Elsevier Inc.  

## 2017-04-27 NOTE — Progress Notes (Signed)
[place] [phone]   [phone]  CONSULT NOTE  REFERRING PHYSICIAN: Dr. Tyler Pita  REASON FOR CONSULTATION:  67 years old African-American male with metastatic lung cancer.  HPI Chad Sparks. is a 67 y.o. male with past medical history significant for alcohol abuse, tobacco abuse as well as history of drug abuse in the past.  The patient also has history of COPD chronic kidney disease hepatitis with cirrhosis and esophageal varices.  He was seen at the [facility] clinic for evaluation of cough and weight loss of more than 20 pounds in November 2017.  CT scan of the chest performed in December 2017 showed 5.0 cm mass in the medial right lower lobe with no significant lymphadenopathy.  There was also scattered groundglass opacities and cirrhotic changes with arthritis as well as gastroesophageal varices.  The patient underwent bronchoscopy with endobronchial ultrasound and biopsies on January 2018 that showed mild narrowing of the medial basilar segment of the right lower lobe but no endobronchial lesion and no significant lymphadenopathy by EBUS.  The biopsy results were negative for malignancy.  A PET scan on April 01, 2016 showed volume loss in the right lower lobe associated with potential underlying mass with totaling size opacity of 5.8 x 6.0 cm and hypermetabolic activity with maximum SUV of 6.0.  There was moderate sized right sided pleural effusion with SUV of 2.2 and malignancy could not be excluded in the fluid.  There was faint right hilar activity questionable for small right hilar node.  The patient was referred to Dr. Tammi Klippel and he had simulation for curative radiotherapy but unfortunately because of transportation issues he was unable to receive his treatment.  He was lost to follow-up for several months.  Repeat PET scan on March 17, 2017 showed significant disease progression presented with progressive right lower lobe lung mass that  now felt the majority of the right lower lobe with surrounding airspace disease likely secondary to postobstructive pneumonitis.  The central hypermetabolic component measured SUV maximum of 14.1.  There was foci of more peripheral hypermetabolism about the right lung likely pleural-based and new.  There was bilateral hypermetabolic pulmonary nodules and a new anterior right lung base nodule measuring 1.3 cm with SUV max of 2.9.  There was also left lower lobe pulmonary nodule that is new and measuring 2.0 cm with maximum SUV of 5.5 and a new hypermetabolic thoracic lymph nodes including a subcarinal lymph node measuring 1.1 cm with SUV max of 7.0. On March 29, 2017 the patient underwent CT-guided core biopsy of the right lower lobe lung mass as well as successful right thoracentesis.  The final pathology ([id number]) was consistent with adenocarcinoma. Dr. Tammi Klippel kindly referred the patient to me today for evaluation and recommendation regarding treatment of his condition. When seen today the patient continues to complain of cough productive of greenish sputum as well as shortness of breath at baseline increased with exertion but no significant chest pain or hemoptysis.  He also has some epigastric pain from the persistent cough.  Has occasional nausea.  He lost 6 more pounds in the last few months.  He denied having any fever or chills.  He has no diarrhea or constipation.  He denied having any headache or visual changes. Family history significant for mother with throat cancer, brother had brain cancer and father had COPD. The patient is single and has 1 son who lives in [place].  He was accompanied today by his Sister  Chad Sparks.  He is to work as a Curator.  He has a history for smoking less than 1 pack/day for around 54 years.  The patient also has a history of alcohol abuse and currently drinks 3 beers every day.  He also has a history of heroin abuse but quit in 1994.  HPI  Past Medical History:    Diagnosis Date  . Alcohol abuse   . Allergy   . Chronic kidney disease   . COPD (chronic obstructive pulmonary disease) ([place])   . Emphysema of lung ([place])   . Heavy cigarette smoker   . Hepatitis C   . Lung cancer [facility])     Past Surgical History:  Procedure Laterality Date  . ESOPHAGOGASTRODUODENOSCOPY (EGD) WITH PROPOFOL N/A 10/15/2014   Procedure: ESOPHAGOGASTRODUODENOSCOPY (EGD) WITH PROPOFOL;  Surgeon: Wonda Horner, MD;  Location: [facility] ENDOSCOPY;  Service: Endoscopy;  Laterality: N/A;    Family History  Problem Relation Age of Onset  . Throat cancer Mother   . Cancer Mother        throat  . Emphysema Father   . Brain cancer Brother   . Cancer Brother        brain  . Cancer Paternal Grandmother        leukemia    Social History Social History   Tobacco Use  . Smoking status: Current Every Day Smoker    Packs/day: 0.50    Years: 53.00    Pack years: 26.50    Types: Cigarettes  . Smokeless tobacco: Never Used  Substance Use Topics  . Alcohol use: Yes    Comment:  2-4 beers a month.  . Drug use: No    Allergies  Allergen Reactions  . Other Shortness Of Breath    Bee stings, causing breathing problems, itching, hives, welts    Current Outpatient Medications  Medication Sig Dispense Refill  . albuterol (PROVENTIL HFA;VENTOLIN HFA) 108 (90 Base) MCG/ACT inhaler Inhale 2 puffs into the lungs every 6 (six) hours as needed for wheezing or shortness of breath. 1 Inhaler 3  . cholecalciferol (D-VI-SOL) 400 UNIT/ML LIQD Take 400 Units by mouth daily.    . fluticasone (FLOVENT HFA) 110 MCG/ACT inhaler Inhale 2 puffs into the lungs 2 (two) times daily. (Patient not taking: Reported on 05/25/2016) 1 Inhaler 3  . gabapentin (NEURONTIN) 100 MG capsule Take 1 capsule (100 mg total) by mouth at bedtime. (Patient not taking: Reported on 03/22/2017) 30 capsule 0  . ipratropium (ATROVENT HFA) 17 MCG/ACT inhaler Inhale 2 puffs into the lungs every 6 (six) hours as needed for  wheezing. (Patient not taking: Reported on 05/25/2016) 1 Inhaler 12  . metoCLOPramide (REGLAN) 10 MG tablet Take 1 tablet (10 mg total) by mouth 3 (three) times daily before meals. (Patient not taking: Reported on 05/25/2016) 30 tablet 0  . omeprazole (PRILOSEC) 20 MG capsule Take 1 capsule (20 mg total) by mouth daily. (Patient not taking: Reported on 05/25/2016) 30 capsule 3  . tamsulosin (FLOMAX) 0.4 MG CAPS capsule Take 1 capsule (0.4 mg total) by mouth daily. (Patient not taking: Reported on 05/25/2016) 30 capsule 3   No current facility-administered medications for this visit.     Review of Systems  Constitutional: positive for anorexia, fatigue and weight loss Eyes: negative Ears, nose, mouth, throat, and face: negative Respiratory: positive for cough, dyspnea on exertion and sputum Cardiovascular: negative Gastrointestinal: negative Genitourinary:negative Integument/breast: negative Hematologic/lymphatic: negative Musculoskeletal:positive for arthralgias and muscle weakness Neurological: negative Behavioral/Psych: negative Endocrine: negative  Allergic/Immunologic: negative  Physical Exam  OXB:DZHGD, healthy, no distress, well nourished and well developed SKIN: skin color, texture, turgor are normal, no rashes or significant lesions HEAD: Normocephalic, No masses, lesions, tenderness or abnormalities EYES: normal, PERRLA, Conjunctiva are pink and non-injected EARS: External ears normal, Canals clear OROPHARYNX:no exudate and no erythema  NECK: supple, no adenopathy LYMPH:  no palpable lymphadenopathy, no hepatosplenomegaly LUNGS: coarse sounds heard, decreased breath sounds HEART: regular rate & rhythm, no murmurs and no gallops ABDOMEN:abdomen soft, non-tender, normal bowel sounds and no masses or organomegaly BACK: Back symmetric, no curvature., No CVA tenderness EXTREMITIES:no joint deformities, effusion, or inflammation, no edema  NEURO: alert & oriented x 3 with fluent  speech, no focal motor/sensory deficits  PERFORMANCE STATUS: ECOG 1-2  LABORATORY DATA: Lab Results  Component Value Date   WBC 4.1 04/27/2017   HGB 10.4 (L) 2017-04-15   HCT 31.9 (L) 04/27/2017   MCV 91.1 04/27/2017   PLT 105 (L) 04/27/2017      Chemistry      Component Value Date/Time   NA 132 (L) 04/27/2017 1309   K 3.7 04/27/2017 1309   CL 104 04/27/2017 1309   CO2 19 (L) 04/27/2017 1309   BUN 7 04/27/2017 1309   CREATININE 1.00 04/27/2017 1309      Component Value Date/Time   CALCIUM 8.6 04/27/2017 1309   ALKPHOS 84 04/27/2017 1309   AST 48 (H) 04/27/2017 1309   ALT 20 04/27/2017 1309   BILITOT 1.2 04/27/2017 1309       RADIOGRAPHIC STUDIES: Ct Biopsy  Result Date: 04-15-2017 INDICATION: Right lower lobe lung mass EXAM: CT BIOPSY MEDICATIONS: None. ANESTHESIA/SEDATION: Fentanyl 100 mcg IV; Versed 2 mg IV Moderate Sedation Time:  27 minutes The patient was continuously monitored during the procedure by the interventional radiology nurse under my direct supervision. FLUOROSCOPY TIME:  Fluoroscopy Time:  minutes  seconds ( mGy). COMPLICATIONS: None immediate. PROCEDURE: Informed written consent was obtained from the patient after a thorough discussion of the procedural risks, benefits and alternatives. All questions were addressed. Maximal Sterile Barrier Technique was utilized including caps, mask, sterile gowns, sterile gloves, sterile drape, hand hygiene and skin antiseptic. A timeout was performed prior to the initiation of the procedure. Under CT guidance, a you we Angiocath was inserted into the pleural space and thoracentesis was performed yielding 300 cc. Subsequently, a(n) 17 gauge guide needle was advanced into the right lower lobe lung mass. Subsequently 2 18 gauge core biopsies were obtained. The guide needle was removed. Post biopsy images demonstrate no hemorrhage. Patient tolerated the procedure well without complication. Vital sign monitoring by nursing staff  during the procedure will continue as patient is in the special procedures unit for post procedure observation. FINDINGS: Initial imaging confirms placement of a you we Angiocath in the right pleural space via posterior approach. Post aspiration images demonstrate a small pneumothorax. The images document guide needle placement within the right lower lobe lung mass. Post biopsy images demonstrate no hemorrhage. IMPRESSION: Successful right thoracentesis and right lower lobe lung mass core biopsy. Electronically Signed   By: Marybelle Killings M.D.   On: 04-15-17 14:22   Dg Chest Port 1 View  Result Date: April 15, 2017 CLINICAL DATA:  Right lung biopsy. EXAM: PORTABLE CHEST 1 VIEW COMPARISON:  CT chest April 15, 2017 FINDINGS: There is right lower lobe airspace opacity. There is a small right pleural effusion. There is no left pleural effusion. There is no pneumothorax. The heart and mediastinal contours are unremarkable. The osseous  structures are unremarkable. IMPRESSION: No right pneumothorax status post lung biopsy. 1. Some small persistent right pleural effusion with persistent basilar airspace opacity. Electronically Signed   By: Kathreen Devoid   On: 03/29/2017 13:24    ASSESSMENT: This is a very pleasant 67 years old African-American male recently diagnosed with stage IV (T3, N2, M1 a) non-small cell lung cancer, adenocarcinoma presented with large right lower lobe lung mass in addition to mediastinal lymphadenopathy, bilateral pulmonary nodules as well as highly suspicious malignant pleural effusion diagnosed and February 2018.  The patient was supposed to start stereotactic body radiotherapy to the early stage disease at that time but he was lost to follow-up and did not show up for his radiotherapy.   PLAN: I had a lengthy discussion with the patient and his sister today about his current disease stage, prognosis and treatment options. I recommended for the patient to complete the staging workup by ordering  MRI of the brain to rule out brain metastasis. I would also ask the pathology department to send his tissue block to [facility] for molecular studies and PDL 1 expression. I explained to the patient that he has an incurable condition and all the treatment will be of palliative nature. I discussed with the patient his treatment options including palliative care and hospice referral versus consideration of palliative systemic chemotherapy with carboplatin for AUC of 5, Alimta 500 mg/M2 and Keytruda 200 mg IV every 3 weeks.  If the molecular studies showed evidence for actionable mutations, we will change his treatment to target therapy. I discussed with the patient the adverse effect of the chemotherapy including but not limited to alopecia, myelosuppression, nausea and vomiting, peripheral neuropathy, liver or renal dysfunction as well as the immunotherapy mediated to skin rash, diarrhea, inflammation of the lung, kidney, liver, thyroid or other endocrine dysfunction. He would like to proceed with systemic chemotherapy and he is expected to start the first dose of this treatment on May 11, 2017. I will arrange for the patient to receive vitamin B12 injection today.  He will also have a chemotherapy education class before the first dose of his treatment. I gave the patient prescription for folic acid 1 mg p.o. daily in addition to Compazine 10 mg p.o. every 6 hours as needed for nausea and Decadron 4 mg p.o. twice daily the day before, day of and day after chemotherapy every 3 weeks. The patient was advised to call immediately if he has any concerning symptoms in the interval. The patient voices understanding of current disease status and treatment options and is in agreement with the current care plan. He will come back for follow-up visit with the start of the first cycle of his treatment. All questions were answered. The patient knows to call the clinic with any problems, questions or concerns. We  can certainly see the patient much sooner if necessary.  Thank you so much for allowing me to participate in the care of [place].. I will continue to follow up the patient with you and assist in his care.  I spent 55 minutes counseling the patient face to face. The total time spent in the appointment was 80 minutes.  Disclaimer: This note was dictated with voice recognition software. Similar sounding words can inadvertently be transcribed and may not be corrected upon review.   Eilleen Kempf April 27, 2017, 2:26 PM

## 2017-04-28 ENCOUNTER — Encounter: Payer: Self-pay | Admitting: *Deleted

## 2017-04-28 ENCOUNTER — Telehealth: Payer: Self-pay | Admitting: Medical Oncology

## 2017-04-28 NOTE — Telephone Encounter (Signed)
I spoke to [facility] [place] re prognosis. Per Julien Nordmann prognosis without treatment is 3 months and with treatment, depending on response, prognosis could be up to a year.

## 2017-04-28 NOTE — Progress Notes (Signed)
Oncology Nurse Navigator Documentation  Oncology Nurse Navigator Flowsheets 04/28/2017  Navigator Location [facility]  Navigator Encounter Type Other;Clinic/MDC/I spoke with patient and sister yesterday at clinic.  I helped explain treatment plan and next steps.     Patient Visit Type MedOnc  Treatment Phase Pre-Tx/Tx Discussion  Barriers/Navigation Needs Coordination of Care;Education  Education Other  Interventions Coordination of Care;Education  Coordination of Care Other  Acuity Level 2  Time Spent with Patient 30

## 2017-05-05 ENCOUNTER — Inpatient Hospital Stay: Payer: Non-veteran care | Attending: Urology

## 2017-05-06 ENCOUNTER — Ambulatory Visit (HOSPITAL_COMMUNITY)
Admission: RE | Admit: 2017-05-06 | Discharge: 2017-05-06 | Disposition: A | Discharge status: Home / Self Care | Payer: Non-veteran care | Source: Ambulatory Visit | Attending: Internal Medicine | Admitting: Internal Medicine

## 2017-05-06 DIAGNOSIS — C3490 Malignant neoplasm of unspecified part of unspecified bronchus or lung: Secondary | ICD-10-CM | POA: Diagnosis not present

## 2017-05-06 MED ORDER — GADOBENATE DIMEGLUMINE 529 MG/ML IV SOLN
15.0000 mL | Freq: Once | INTRAVENOUS | Status: AC | PRN
Start: 2017-05-06 — End: 2017-05-06
  Administered 2017-05-06: 13 mL via INTRAVENOUS

## 2017-05-11 ENCOUNTER — Telehealth: Payer: Self-pay | Admitting: *Deleted

## 2017-05-11 NOTE — Telephone Encounter (Signed)
Darlena from managed Care here to discuss [facility] has not approved pt's appts to Medical Oncology with Dr. Julien Nordmann . They have only approved pt to be seen in Radiation Oncology. Called pt advised of above information. Message to scheduling to cancel appt

## 2017-05-12 ENCOUNTER — Telehealth: Payer: Self-pay | Admitting: Medical Oncology

## 2017-05-12 ENCOUNTER — Encounter: Payer: Self-pay | Admitting: *Deleted

## 2017-05-12 ENCOUNTER — Ambulatory Visit: Payer: Non-veteran care | Admitting: Oncology

## 2017-05-12 ENCOUNTER — Ambulatory Visit: Payer: Non-veteran care

## 2017-05-12 ENCOUNTER — Other Ambulatory Visit: Payer: Non-veteran care

## 2017-05-12 NOTE — Telephone Encounter (Signed)
Confirmed appt cancelled.

## 2017-05-12 NOTE — Progress Notes (Signed)
Oncology Nurse Navigator Documentation  Oncology Nurse Navigator Flowsheets 05/12/2017  Navigator Location [facility]  Navigator Encounter Type Other/I spoke with Chad Sparks, Chad Sparks case Freight forwarder.  She updated me that patient is unable to get med onc and chemo services here at [facility], his insurance will not cover.  [name] will have to go to [place].  Chad Sparks will call patient  and make appointments for patient to be seen by med onc at [facility].  I udpated Chad Sparks and his other providers here at the cancer center.  Per request of [facility], I will fax Chad Sparks last note for continuity of care.   Treatment Phase Treatment  Barriers/Navigation Needs Coordination of Care  Interventions Coordination of Care  Coordination of Care Other  Acuity Level 2  Time Spent with Patient 60

## 2017-05-12 NOTE — Progress Notes (Signed)
Oncology Nurse Navigator Documentation  Oncology Nurse Navigator Flowsheets 05/12/2017  Nav[facility] Location [facility]  Navigator Encounter Type Other/I called [facility] case worker Christian Mate.  I left vm message to get clarification on authorization for treatment.   Treatment Phase Treatment  Barriers/Navigation Needs Coordination of Care  Interventions Coordination of Care  Coordination of Care Other  Acuity Level 2  Time Spent with Patient 15

## 2017-05-13 ENCOUNTER — Telehealth: Payer: Self-pay | Admitting: *Deleted

## 2017-05-13 ENCOUNTER — Telehealth: Payer: Self-pay | Admitting: General Practice

## 2017-05-13 NOTE — Telephone Encounter (Signed)
[facility] CSW Progress Notes  Call from patient "what do I need to do to get buried in [place]?"  CSW referred patient to his [place] social work team, also called and left VM for [employer] and [place] to contact patient and let him know process.  Edwyna Shell, LCSW Clinical Social Worker Phone:  [phone]

## 2017-05-13 NOTE — Telephone Encounter (Signed)
Oncology Nurse Navigator Documentation  Oncology Nurse Navigator Flowsheets 05/13/2017  Navigator Location [facility]  Navigator Encounter Type Telephone/I called patient's sister and spoke with her about [facility] not authorizing him to get chemo here. She states she just got a call from a Dr. Janne Napoleon at the [facility] and he is going to place a referral for him to get treated here.  I stated to sister that I will keep the appt for 06/02/17 and will update authorization coordinator.  I will also update his care team.   Telephone Outgoing Call  Treatment Phase Treatment  Barriers/Navigation Needs Coordination of Care  Interventions Coordination of Care  Coordination of Care Other  Acuity Level 2  Time Spent with Patient 30

## 2017-05-20 ENCOUNTER — Telehealth: Payer: Self-pay | Admitting: Medical Oncology

## 2017-05-20 NOTE — Telephone Encounter (Signed)
Family member called and said [facility] approved his tx here. Should he keep his appt April 3?  I left message for pt to keep appt April 3rd

## 2017-06-02 ENCOUNTER — Ambulatory Visit: Payer: Non-veteran care | Admitting: Nutrition

## 2017-06-02 ENCOUNTER — Encounter: Payer: Self-pay | Admitting: *Deleted

## 2017-06-02 ENCOUNTER — Inpatient Hospital Stay: Payer: Non-veteran care

## 2017-06-02 ENCOUNTER — Inpatient Hospital Stay: Payer: Non-veteran care | Attending: Urology

## 2017-06-02 ENCOUNTER — Inpatient Hospital Stay (HOSPITAL_BASED_OUTPATIENT_CLINIC_OR_DEPARTMENT_OTHER): Payer: Non-veteran care | Admitting: Oncology

## 2017-06-02 ENCOUNTER — Encounter: Payer: Non-veteran care | Admitting: Nutrition

## 2017-06-02 ENCOUNTER — Ambulatory Visit: Payer: Non-veteran care

## 2017-06-02 ENCOUNTER — Telehealth: Payer: Self-pay | Admitting: Oncology

## 2017-06-02 ENCOUNTER — Encounter: Payer: Self-pay | Admitting: Oncology

## 2017-06-02 VITALS — BP 106/73 | HR 87 | Temp 97.6°F | Resp 18 | Ht 74.5 in | Wt 137.3 lb

## 2017-06-02 DIAGNOSIS — J910 Malignant pleural effusion: Secondary | ICD-10-CM | POA: Diagnosis not present

## 2017-06-02 DIAGNOSIS — F17210 Nicotine dependence, cigarettes, uncomplicated: Secondary | ICD-10-CM | POA: Diagnosis not present

## 2017-06-02 DIAGNOSIS — R634 Abnormal weight loss: Secondary | ICD-10-CM | POA: Insufficient documentation

## 2017-06-02 DIAGNOSIS — J449 Chronic obstructive pulmonary disease, unspecified: Secondary | ICD-10-CM | POA: Insufficient documentation

## 2017-06-02 DIAGNOSIS — R590 Localized enlarged lymph nodes: Secondary | ICD-10-CM | POA: Diagnosis not present

## 2017-06-02 DIAGNOSIS — G319 Degenerative disease of nervous system, unspecified: Secondary | ICD-10-CM | POA: Diagnosis not present

## 2017-06-02 DIAGNOSIS — Z7189 Other specified counseling: Secondary | ICD-10-CM

## 2017-06-02 DIAGNOSIS — Z79899 Other long term (current) drug therapy: Secondary | ICD-10-CM | POA: Diagnosis not present

## 2017-06-02 DIAGNOSIS — Z5112 Encounter for antineoplastic immunotherapy: Secondary | ICD-10-CM | POA: Insufficient documentation

## 2017-06-02 DIAGNOSIS — R55 Syncope and collapse: Secondary | ICD-10-CM | POA: Diagnosis not present

## 2017-06-02 DIAGNOSIS — R630 Anorexia: Secondary | ICD-10-CM

## 2017-06-02 DIAGNOSIS — N189 Chronic kidney disease, unspecified: Secondary | ICD-10-CM | POA: Insufficient documentation

## 2017-06-02 DIAGNOSIS — B1920 Unspecified viral hepatitis C without hepatic coma: Secondary | ICD-10-CM

## 2017-06-02 DIAGNOSIS — C3431 Malignant neoplasm of lower lobe, right bronchus or lung: Secondary | ICD-10-CM | POA: Diagnosis not present

## 2017-06-02 DIAGNOSIS — Z5111 Encounter for antineoplastic chemotherapy: Secondary | ICD-10-CM | POA: Insufficient documentation

## 2017-06-02 DIAGNOSIS — R05 Cough: Secondary | ICD-10-CM | POA: Insufficient documentation

## 2017-06-02 DIAGNOSIS — D649 Anemia, unspecified: Secondary | ICD-10-CM

## 2017-06-02 DIAGNOSIS — C3491 Malignant neoplasm of unspecified part of right bronchus or lung: Secondary | ICD-10-CM

## 2017-06-02 LAB — CBC WITH DIFFERENTIAL (CANCER CENTER ONLY)
Basophils Absolute: 0 10*3/uL (ref 0.0–0.1)
Basophils Relative: 0 %
EOS PCT: 0 %
Eosinophils Absolute: 0 10*3/uL (ref 0.0–0.5)
HEMATOCRIT: 29.9 % — AB (ref 38.4–49.9)
Hemoglobin: 10 g/dL — ABNORMAL LOW (ref 13.0–17.1)
LYMPHS ABS: 1 10*3/uL (ref 0.9–3.3)
LYMPHS PCT: 17 %
MCH: 30 pg (ref 27.2–33.4)
MCHC: 33.4 g/dL (ref 32.0–36.0)
MCV: 89.8 fL (ref 79.3–98.0)
MONO ABS: 0.4 10*3/uL (ref 0.1–0.9)
Monocytes Relative: 7 %
Neutro Abs: 4.4 10*3/uL (ref 1.5–6.5)
Neutrophils Relative %: 76 %
PLATELETS: 130 10*3/uL — AB (ref 140–400)
RBC: 3.33 MIL/uL — ABNORMAL LOW (ref 4.20–5.82)
RDW: 15.7 % — AB (ref 11.0–14.6)
WBC Count: 5.8 10*3/uL (ref 4.0–10.3)

## 2017-06-02 LAB — CMP (CANCER CENTER ONLY)
ALBUMIN: 1.9 g/dL — AB (ref 3.5–5.0)
ALT: 18 U/L (ref 0–55)
AST: 34 U/L (ref 5–34)
Alkaline Phosphatase: 93 U/L (ref 40–150)
Anion gap: 7 (ref 3–11)
BILIRUBIN TOTAL: 0.8 mg/dL (ref 0.2–1.2)
BUN: 7 mg/dL (ref 7–26)
CHLORIDE: 107 mmol/L (ref 98–109)
CO2: 19 mmol/L — ABNORMAL LOW (ref 22–29)
Calcium: 8.8 mg/dL (ref 8.4–10.4)
Creatinine: 0.91 mg/dL (ref 0.70–1.30)
GFR, Est AFR Am: >60 mL/min (ref >60)
GFR, Estimated: >60 mL/min (ref >60)
GLUCOSE: 137 mg/dL (ref 70–140)
POTASSIUM: 3.9 mmol/L (ref 3.5–5.1)
SODIUM: 133 mmol/L — AB (ref 136–145)
Total Protein: 7.4 g/dL (ref 6.4–8.3)

## 2017-06-02 LAB — TSH: TSH: 1.404 u[IU]/mL (ref 0.320–4.118)

## 2017-06-02 MED ORDER — SODIUM CHLORIDE 0.9 % IV SOLN
Freq: Once | INTRAVENOUS | Status: AC
  Administered 2017-06-02: 11:00:00 via INTRAVENOUS

## 2017-06-02 MED ORDER — FOSAPREPITANT DIMEGLUMINE INJECTION 150 MG
Freq: Once | INTRAVENOUS | Status: AC
  Administered 2017-06-02: 12:00:00 via INTRAVENOUS
  Filled 2017-06-02: qty 5

## 2017-06-02 MED ORDER — SODIUM CHLORIDE 0.9 % IV SOLN
200.0000 mg | Freq: Once | INTRAVENOUS | Status: AC
  Administered 2017-06-02: 200 mg via INTRAVENOUS
  Filled 2017-06-02: qty 8

## 2017-06-02 MED ORDER — PALONOSETRON HCL INJECTION 0.25 MG/5ML
INTRAVENOUS | Status: AC
  Filled 2017-06-02: qty 5

## 2017-06-02 MED ORDER — PALONOSETRON HCL INJECTION 0.25 MG/5ML
0.2500 mg | Freq: Once | INTRAVENOUS | Status: AC
  Administered 2017-06-02: 0.25 mg via INTRAVENOUS

## 2017-06-02 MED ORDER — SODIUM CHLORIDE 0.9 % IV SOLN
441.0000 mg | Freq: Once | INTRAVENOUS | Status: AC
  Administered 2017-06-02: 440 mg via INTRAVENOUS
  Filled 2017-06-02: qty 44

## 2017-06-02 MED ORDER — LIDOCAINE-PRILOCAINE 2.5-2.5 % EX CREA: 1.0000 "application " | TOPICAL_CREAM | CUTANEOUS | 0 refills | Status: DC | PRN

## 2017-06-02 MED ORDER — LIDOCAINE-PRILOCAINE 2.5-2.5 % EX CREA: 1.0000 "application " | TOPICAL_CREAM | CUTANEOUS | 0 refills | Status: AC | PRN

## 2017-06-02 MED ORDER — SODIUM CHLORIDE 0.9 % IV SOLN
500.0000 mg/m2 | Freq: Once | INTRAVENOUS | Status: AC
  Administered 2017-06-02: 900 mg via INTRAVENOUS
  Filled 2017-06-02: qty 16

## 2017-06-02 NOTE — Patient Instructions (Signed)
Pembrolizumab injection What is this medicine? PEMBROLIZUMAB (pem broe liz ue mab) is a monoclonal antibody. It is used to treat melanoma, head and neck cancer, Hodgkin lymphoma, non-small cell lung cancer, urothelial cancer, stomach cancer, and cancers that have a certain genetic condition. This medicine may be used for other purposes; ask your health care provider or pharmacist if you have questions. COMMON BRAND NAME(S): Keytruda What should I tell my health care provider before I take this medicine? They need to know if you have any of these conditions: -diabetes -immune system problems -inflammatory bowel disease -liver disease -lung or breathing disease -lupus -organ transplant -an unusual or allergic reaction to pembrolizumab, other medicines, foods, dyes, or preservatives -pregnant or trying to get pregnant -breast-feeding How should I use this medicine? This medicine is for infusion into a vein. It is given by a health care professional in a hospital or clinic setting. A special MedGuide will be given to you before each treatment. Be sure to read this information carefully each time. Talk to your pediatrician regarding the use of this medicine in children. While this drug may be prescribed for selected conditions, precautions do apply. Overdosage: If you think you have taken too much of this medicine contact a poison control center or emergency room at once. NOTE: This medicine is only for you. Do not share this medicine with others. What if I miss a dose? It is important not to miss your dose. Call your doctor or health care professional if you are unable to keep an appointment. What may interact with this medicine? Interactions have not been studied. Give your health care provider a list of all the medicines, herbs, non-prescription drugs, or dietary supplements you use. Also tell them if you smoke, drink alcohol, or use illegal drugs. Some items may interact with your  medicine. This list may not describe all possible interactions. Give your health care provider a list of all the medicines, herbs, non-prescription drugs, or dietary supplements you use. Also tell them if you smoke, drink alcohol, or use illegal drugs. Some items may interact with your medicine. What should I watch for while using this medicine? Your condition will be monitored carefully while you are receiving this medicine. You may need blood work done while you are taking this medicine. Do not become pregnant while taking this medicine or for 4 months after stopping it. Women should inform their doctor if they wish to become pregnant or think they might be pregnant. There is a potential for serious side effects to an unborn child. Talk to your health care professional or pharmacist for more information. Do not breast-feed an infant while taking this medicine or for 4 months after the last dose. What side effects may I notice from receiving this medicine? Side effects that you should report to your doctor or health care professional as soon as possible: -allergic reactions like skin rash, itching or hives, swelling of the face, lips, or tongue -bloody or black, tarry -breathing problems -changes in vision -chest pain -chills -constipation -cough -dizziness or feeling faint or lightheaded -fast or irregular heartbeat -fever -flushing -hair loss -low blood counts - this medicine may decrease the number of white blood cells, red blood cells and platelets. You may be at increased risk for infections and bleeding. -muscle pain -muscle weakness -persistent headache -signs and symptoms of high blood sugar such as dizziness; dry mouth; dry skin; fruity breath; nausea; stomach pain; increased hunger or thirst; increased urination -signs and symptoms of kidney  injury like trouble passing urine or change in the amount of urine -signs and symptoms of liver injury like dark urine, light-colored  stools, loss of appetite, nausea, right upper belly pain, yellowing of the eyes or skin -stomach pain -sweating -weight loss Side effects that usually do not require medical attention (report to your doctor or health care professional if they continue or are bothersome): -decreased appetite -diarrhea -tiredness This list may not describe all possible side effects. Call your doctor for medical advice about side effects. You may report side effects to FDA at 1-800-FDA-1088. Where should I keep my medicine? This drug is given in a hospital or clinic and will not be stored at home. NOTE: This sheet is a summary. It may not cover all possible information. If you have questions about this medicine, talk to your doctor, pharmacist, or health care provider.  2018 Elsevier/Gold Standard (2015-11-26 Pemetrexed injection What is this medicine? PEMETREXED (PEM e TREX ed) is a chemotherapy drug used to treat lung cancers like non-small cell lung cancer and mesothelioma. It may also be used to treat other cancers. This medicine may be used for other purposes; ask your health care provider or pharmacist if you have questions. COMMON BRAND NAME(S): Alimta What should I tell my health care provider before I take this medicine? They need to know if you have any of these conditions: -infection (especially a virus infection such as chickenpox, cold sores, or herpes) -kidney disease -low blood counts, like low white cell, platelet, or red cell counts -lung or breathing disease, like asthma -radiation therapy -an unusual or allergic reaction to pemetrexed, other medicines, foods, dyes, or preservative -pregnant or trying to get pregnant -breast-feeding How should I use this medicine? This drug is given as an infusion into a vein. It is administered in a hospital or clinic by a specially trained health care professional. Talk to your pediatrician regarding the use of this medicine in children. Special care  may be needed. Overdosage: If you think you have taken too much of this medicine contact a poison control center or emergency room at once. NOTE: This medicine is only for you. Do not share this medicine with others. What if I miss a dose? It is important not to miss your dose. Call your doctor or health care professional if you are unable to keep an appointment. What may interact with this medicine? This medicine may interact with the following medications: -Ibuprofen This list may not describe all possible interactions. Give your health care provider a list of all the medicines, herbs, non-prescription drugs, or dietary supplements you use. Also tell them if you smoke, drink alcohol, or use illegal drugs. Some items may interact with your medicine. What should I watch for while using this medicine? Visit your doctor for checks on your progress. This drug may make you feel generally unwell. This is not uncommon, as chemotherapy can affect healthy cells as well as cancer cells. Report any side effects. Continue your course of treatment even though you feel ill unless your doctor tells you to stop. In some cases, you may be given additional medicines to help with side effects. Follow all directions for their use. Call your doctor or health care professional for advice if you get a fever, chills or sore throat, or other symptoms of a cold or flu. Do not treat yourself. This drug decreases your body's ability to fight infections. Try to avoid being around people who are sick. This medicine may increase your risk  to bruise or bleed. Call your doctor or health care professional if you notice any unusual bleeding. Be careful brushing and flossing your teeth or using a toothpick because you may get an infection or bleed more easily. If you have any dental work done, tell your dentist you are receiving this medicine. Avoid taking products that contain aspirin, acetaminophen, ibuprofen, naproxen, or ketoprofen  unless instructed by your doctor. These medicines may hide a fever. Call your doctor or health care professional if you get diarrhea or mouth sores. Do not treat yourself. To protect your kidneys, drink water or other fluids as directed while you are taking this medicine. Do not become pregnant while taking this medicine or for 6 months after stopping it. Women should inform their doctor if they wish to become pregnant or think they might be pregnant. Men should not father a child while taking this medicine and for 3 months after stopping it. This may interfere with the ability to father a child. You should talk to your doctor or health care professional if you are concerned about your fertility. There is a potential for serious side effects to an unborn child. Talk to your health care professional or pharmacist for more information. Do not breast-feed an infant while taking this medicine or for 1 week after stopping it. What side effects may I notice from receiving this medicine? Side effects that you should report to your doctor or health care professional as soon as possible: -allergic reactions like skin rash, itching or hives, swelling of the face, lips, or tongue -breathing problems -redness, blistering, peeling or loosening of the skin, including inside the mouth -signs and symptoms of bleeding such as bloody or black, tarry stools; red or dark-brown urine; spitting up blood or brown material that looks like coffee grounds; red spots on the skin; unusual bruising or bleeding from the eye, gums, or nose -signs and symptoms of infection like fever or chills; cough; sore throat; pain or trouble passing urine -signs and symptoms of kidney injury like trouble passing urine or change in the amount of urine -signs and symptoms of liver injury like dark yellow or brown urine; general ill feeling or flu-like symptoms; light-colored stools; loss of appetite; nausea; right upper belly pain; unusually weak or  tired; yellowing of the eyes or skin Side effects that usually do not require medical attention (report to your doctor or health care professional if they continue or are bothersome): -constipation -dizziness -mouth sores -nausea, vomiting -pain, tingling, numbness in the hands or feet -unusually weak or tired This list may not describe all possible side effects. Call your doctor for medical advice about side effects. You may report side effects to FDA at 1-800-FDA-1088. Where should I keep my medicine? This drug is given in a hospital or clinic and will not be stored at home. NOTE: This sheet is a summary. It may not cover all possible information. If you have questions about this medicine, talk to your doctor, pharmacist, or health care provider.  2018 Elsevier/Gold Standard (2015-12-17 18:51:46)Carboplatin injection What is this medicine? CARBOPLATIN (KAR boe pla tin) is a chemotherapy drug. It targets fast dividing cells, like cancer cells, and causes these cells to die. This medicine is used to treat ovarian cancer and many other cancers. This medicine may be used for other purposes; ask your health care provider or pharmacist if you have questions. COMMON BRAND NAME(S): Paraplatin What should I tell my health care provider before I take this medicine? They need  to know if you have any of these conditions: -blood disorders -hearing problems -kidney disease -recent or ongoing radiation therapy -an unusual or allergic reaction to carboplatin, cisplatin, other chemotherapy, other medicines, foods, dyes, or preservatives -pregnant or trying to get pregnant -breast-feeding How should I use this medicine? This drug is usually given as an infusion into a vein. It is administered in a hospital or clinic by a specially trained health care professional. Talk to your pediatrician regarding the use of this medicine in children. Special care may be needed. Overdosage: If you think you have  taken too much of this medicine contact a poison control center or emergency room at once. NOTE: This medicine is only for you. Do not share this medicine with others. What if I miss a dose? It is important not to miss a dose. Call your doctor or health care professional if you are unable to keep an appointment. What may interact with this medicine? -medicines for seizures -medicines to increase blood counts like filgrastim, pegfilgrastim, sargramostim -some antibiotics like amikacin, gentamicin, neomycin, streptomycin, tobramycin -vaccines Talk to your doctor or health care professional before taking any of these medicines: -acetaminophen -aspirin -ibuprofen -ketoprofen -naproxen This list may not describe all possible interactions. Give your health care provider a list of all the medicines, herbs, non-prescription drugs, or dietary supplements you use. Also tell them if you smoke, drink alcohol, or use illegal drugs. Some items may interact with your medicine. What should I watch for while using this medicine? Your condition will be monitored carefully while you are receiving this medicine. You will need important blood work done while you are taking this medicine. This drug may make you feel generally unwell. This is not uncommon, as chemotherapy can affect healthy cells as well as cancer cells. Report any side effects. Continue your course of treatment even though you feel ill unless your doctor tells you to stop. In some cases, you may be given additional medicines to help with side effects. Follow all directions for their use. Call your doctor or health care professional for advice if you get a fever, chills or sore throat, or other symptoms of a cold or flu. Do not treat yourself. This drug decreases your body's ability to fight infections. Try to avoid being around people who are sick. This medicine may increase your risk to bruise or bleed. Call your doctor or health care professional  if you notice any unusual bleeding. Be careful brushing and flossing your teeth or using a toothpick because you may get an infection or bleed more easily. If you have any dental work done, tell your dentist you are receiving this medicine. Avoid taking products that contain aspirin, acetaminophen, ibuprofen, naproxen, or ketoprofen unless instructed by your doctor. These medicines may hide a fever. Do not become pregnant while taking this medicine. Women should inform their doctor if they wish to become pregnant or think they might be pregnant. There is a potential for serious side effects to an unborn child. Talk to your health care professional or pharmacist for more information. Do not breast-feed an infant while taking this medicine. What side effects may I notice from receiving this medicine? Side effects that you should report to your doctor or health care professional as soon as possible: -allergic reactions like skin rash, itching or hives, swelling of the face, lips, or tongue -signs of infection - fever or chills, cough, sore throat, pain or difficulty passing urine -signs of decreased platelets or bleeding - bruising,  pinpoint red spots on the skin, black, tarry stools, nosebleeds -signs of decreased red blood cells - unusually weak or tired, fainting spells, lightheadedness -breathing problems -changes in hearing -changes in vision -chest pain -high blood pressure -low blood counts - This drug may decrease the number of white blood cells, red blood cells and platelets. You may be at increased risk for infections and bleeding. -nausea and vomiting -pain, swelling, redness or irritation at the injection site -pain, tingling, numbness in the hands or feet -problems with balance, talking, walking -trouble passing urine or change in the amount of urine Side effects that usually do not require medical attention (report to your doctor or health care professional if they continue or are  bothersome): -hair loss -loss of appetite -metallic taste in the mouth or changes in taste This list may not describe all possible side effects. Call your doctor for medical advice about side effects. You may report side effects to FDA at 1-800-FDA-1088. Where should I keep my medicine? This drug is given in a hospital or clinic and will not be stored at home. NOTE: This sheet is a summary. It may not cover all possible information. If you have questions about this medicine, talk to your doctor, pharmacist, or health care provider.  2018 Elsevier/Gold Standard (2007-05-24 14:38:05) [facility] Discharge Instructions for Patients Receiving Chemotherapy  Today you received the following chemotherapy agents keytruda, alimta and carboplatin.  To help prevent nausea and vomiting after your treatment, we encourage you to take your nausea medication, compazine.   If you develop nausea and vomiting that is not controlled by your nausea medication, call the clinic.   BELOW ARE SYMPTOMS THAT SHOULD BE REPORTED IMMEDIATELY:  *FEVER GREATER THAN 100.5 F  *CHILLS WITH OR WITHOUT FEVER  NAUSEA AND VOMITING THAT IS NOT CONTROLLED WITH YOUR NAUSEA MEDICATION  *UNUSUAL SHORTNESS OF BREATH  *UNUSUAL BRUISING OR BLEEDING  TENDERNESS IN MOUTH AND THROAT WITH OR WITHOUT PRESENCE OF ULCERS  *URINARY PROBLEMS  *BOWEL PROBLEMS  UNUSUAL RASH Items with * indicate a potential emergency and should be followed up as soon as possible.  Feel free to call the clinic should you have any questions or concerns. The clinic phone number is [phone].  Please show the [place] at check-in to the Emergency Department and triage nurse.

## 2017-06-02 NOTE — Progress Notes (Signed)
Oncology Nurse Navigator Documentation  Oncology Nurse Navigator Flowsheets 06/02/2017  Navigator Location [facility]  Navigator Encounter Type Other/I requested foundation one and PDL 1 testing per Dr. Julien Nordmann.   Treatment Phase Treatment  Barriers/Navigation Needs Coordination of Care  Interventions Coordination of Care  Coordination of Care Other  Acuity Level 2  Time Spent with Patient 30

## 2017-06-02 NOTE — Assessment & Plan Note (Addendum)
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  The patient was supposed to start stereotactic body radiotherapy to the early stage disease at that time but he was lost to follow-up and did not show up for his radiotherapy. The patient had a recent MRI of the brain and is here to discuss the results and treatment options.  The patient was seen with Dr. Julien Nordmann.  Discussed that the MRI of the brain did not show any evidence of metastatic disease.  PDL 1 and molecular studies are still pending. We had a discussion with the patient and his sister today about his current disease stage, prognosis and treatment options. Explained to the patient that he has an incurable condition and all the treatment will be of palliative nature. Treatment options have previously been discussed including palliative care and hospice referral versus consideration of palliative systemic chemotherapy with carboplatin for AUC of 5, Alimta 500 mg/M2 and Keytruda 200 mg IV every 3 weeks.   The patient would like to proceed with palliative systemic chemotherapy combined with immunotherapy. Adverse effect of the chemotherapy were again reviewed including but not limited to alopecia, myelosuppression, nausea and vomiting, peripheral neuropathy, liver or renal dysfunction as well as the immunotherapy mediated to skin rash, diarrhea, inflammation of the lung, kidney, liver, thyroid or other endocrine dysfunction. The patient has attended a chemotherapy education class already. Recommend for him to proceed with cycle 1 of his treatment today as scheduled. He will have weekly labs. The patient has requested a Port-A-Cath for his  chemotherapy.  A referral to interventional radiology has been made for this.  A prescription for EMLA cream has been given to the patient. The patient will follow-up in 3 weeks for evaluation prior to cycle 2 of his treatment.  For weight loss, the patient will be seen by the dietitian in the infusion area later today.  The patient was advised to call immediately if he has any concerning symptoms in the interval. The patient voices understanding of current disease status and treatment options and is in agreement with the current care plan.  All questions were answered. The patient knows to call the clinic with any problems, questions or concerns. We can certainly see the patient much sooner if necessary.

## 2017-06-02 NOTE — Progress Notes (Signed)
[place] [address]  DIAGNOSIS: stage IV (T3, N2, M1 a) non-small cell lung cancer, adenocarcinoma presented with large right lower lobe lung mass in addition to mediastinal lymphadenopathy, bilateral pulmonary nodules as well as highly suspicious malignant pleural effusion diagnosed and February 2018.  The patient was supposed to start stereotactic body radiotherapy to the early stage disease at that time but he was lost to follow-up and did not show up for his radiotherapy.  PRIOR THERAPY: None  CURRENT THERAPY: Palliative systemic chemotherapy with carboplatin for AUC of 5, Alimta 500 mg/M2 and Keytruda 200 mg IV every 3 weeks.  First dose of 06/02/2017.  INTERVAL HISTORY: Chin Wachter. 67 y.o. male returns for routine follow-up visit accompanied by his sister.  Patient was delayed in returning to [place] due to awaiting approval of his visits and chemotherapy from the [place].  The patient is feeling fine today and has no specific complaints except for cough.  He reports that there are times that he coughs so hard that he blacks out.  He denies fevers and chills.  Denies chest pain, shortness of breath, hemoptysis.  Denies nausea, vomiting, constipation, diarrhea.  He reports a decreased appetite and has lost a few more pounds since his last visit.  The patient had a recent MRI of the brain and is here to discuss the results.  MEDICAL HISTORY: Past Medical History:  Diagnosis Date  . Alcohol abuse   . Allergy   . Chronic kidney disease   . COPD (chronic obstructive pulmonary disease) ([place])   . Emphysema of lung ([place])   . Heavy cigarette smoker   . Hepatitis C   . Lung cancer ([place])     ALLERGIES:  is allergic to other.  MEDICATIONS:  Current Outpatient Medications  Medication Sig Dispense Refill  . albuterol (PROVENTIL HFA;VENTOLIN HFA) 108 (90 Base) MCG/ACT inhaler Inhale 2  puffs into the lungs every 6 (six) hours as needed for wheezing or shortness of breath. 1 Inhaler 3  . dexamethasone (DECADRON) 4 MG tablet 4 mg po bid the day before, day off and day after chemo 40 tablet 1  . fluticasone (FLOVENT HFA) 110 MCG/ACT inhaler Inhale 2 puffs into the lungs 2 (two) times daily. 1 Inhaler 3  . folic acid (FOLVITE) 1 MG tablet Take 1 tablet (1 mg total) by mouth daily. 30 tablet 4  . ipratropium (ATROVENT HFA) 17 MCG/ACT inhaler Inhale 2 puffs into the lungs every 6 (six) hours as needed for wheezing. 1 Inhaler 12  . prochlorperazine (COMPAZINE) 10 MG tablet Take 1 tablet (10 mg total) by mouth every 6 (six) hours as needed for nausea or vomiting. 30 tablet 0  . cholecalciferol (D-VI-SOL) 400 UNIT/ML LIQD Take 400 Units by mouth daily.    Marland Kitchen gabapentin (NEURONTIN) 100 MG capsule Take 1 capsule (100 mg total) by mouth at bedtime. (Patient not taking: Reported on 06/02/2017) 30 capsule 0  . lidocaine-prilocaine (EMLA) cream Apply 1 application topically as needed. 30 g 0  . metoCLOPramide (REGLAN) 10 MG tablet Take 1 tablet (10 mg total) by mouth 3 (three) times daily before meals. (Patient not taking: Reported on 06/02/2017) 30 tablet 0  . omeprazole (PRILOSEC) 20 MG capsule Take 1 capsule (20 mg total) by mouth daily. (Patient not taking: Reported on 06/02/2017) 30 capsule 3  . tamsulosin (FLOMAX) 0.4 MG CAPS capsule Take 1 capsule (0.4 mg total) by mouth  daily. (Patient not taking: Reported on 06/02/2017) 30 capsule 3   No current facility-administered medications for this visit.    Facility-Administered Medications Ordered in Other Visits  Medication Dose Route Frequency Provider Last Rate Last Dose  . CARBOplatin (PARAPLATIN) 440 mg in sodium chloride 0.9 % 250 mL chemo infusion  440 mg Intravenous Once Curt Bears, MD      . PEMEtrexed (ALIMTA) 900 mg in sodium chloride 0.9 % 100 mL chemo infusion  500 mg/m2 (Treatment Plan Recorded) Intravenous Once Curt Bears, MD    900 mg at 06/02/17 1352    SURGICAL HISTORY:  Past Surgical History:  Procedure Laterality Date  . ESOPHAGOGASTRODUODENOSCOPY (EGD) WITH PROPOFOL N/A 10/15/2014   Procedure: ESOPHAGOGASTRODUODENOSCOPY (EGD) WITH PROPOFOL;  Surgeon: Wonda Horner, MD;  Location: [facility] ENDOSCOPY;  Service: Endoscopy;  Laterality: N/A;    REVIEW OF SYSTEMS:   Review of Systems  Constitutional: Negative for chills, fatigue, fever and unexpected weight change.  HENT:   Negative for mouth sores, nosebleeds, sore throat and trouble swallowing.   Eyes: Negative for eye problems and icterus.  Respiratory: Negative for  hemoptysis, shortness of breath and wheezing.  Positive for cough. Cardiovascular: Negative for chest pain and leg swelling.  Gastrointestinal: Negative for abdominal pain, constipation, diarrhea, nausea and vomiting.  Genitourinary: Negative for bladder incontinence, difficulty urinating, dysuria, frequency and hematuria.   Musculoskeletal: Negative for back pain, neck pain and neck stiffness.  Skin: Negative for itching and rash.  Neurological: Negative for dizziness, extremity weakness, headaches, light-headedness and seizures. Positive for syncopal episodes with coughing spells. Hematological: Negative for adenopathy. Does not bruise/bleed easily.  Psychiatric/Behavioral: Negative for confusion, depression and sleep disturbance. The patient is not nervous/anxious.     PHYSICAL EXAMINATION:  Blood pressure 106/73, pulse 87, temperature 97.6 F (36.4 C), temperature source Oral, resp. rate 18, height 6' 2.5" (1.892 m), weight 137 lb 4.8 oz (62.3 kg), SpO2 100 %.  ECOG PERFORMANCE STATUS: 1 - Symptomatic but completely ambulatory  Physical Exam  Constitutional: Oriented to person, place, and time and well-developed. No distress.  HENT:  Head: Normocephalic and atraumatic.  Mouth/Throat: Oropharynx is clear and moist. No oropharyngeal exudate.  Eyes: Conjunctivae are normal. Right eye exhibits  no discharge. Left eye exhibits no discharge. No scleral icterus.  Neck: Normal range of motion. Neck supple.  Cardiovascular: Normal rate, regular rhythm, normal heart sounds and intact distal pulses.   Pulmonary/Chest: Effort normal and breath sounds normal. No respiratory distress. No wheezes. No rales.  Abdominal: Soft. Bowel sounds are normal. Exhibits no distension and no mass. There is no tenderness.  Musculoskeletal: Normal range of motion. Exhibits no edema.  Lymphadenopathy:    No cervical adenopathy.  Neurological: Alert and oriented to person, place, and time. Exhibits normal muscle tone. Coordination normal.  Skin: Skin is warm and dry. No rash noted. Not diaphoretic. No erythema. No pallor.  Psychiatric: Mood, memory and judgment normal.  Vitals reviewed.  LABORATORY DATA: Lab Results  Component Value Date   WBC 5.8 06/02/2017   HGB 10.4 (L) 03/29/2017   HCT 29.9 (L) 06/02/2017   MCV 89.8 06/02/2017   PLT 130 (L) 06/02/2017      Chemistry      Component Value Date/Time   NA 133 (L) 06/02/2017 0914   K 3.9 06/02/2017 0914   CL 107 06/02/2017 0914   CO2 19 (L) 06/02/2017 0914   BUN 7 06/02/2017 0914   CREATININE 0.91 06/02/2017 0914      Component  Value Date/Time   CALCIUM 8.8 06/02/2017 0914   ALKPHOS 93 06/02/2017 0914   AST 34 06/02/2017 0914   ALT 18 06/02/2017 0914   BILITOT 0.8 06/02/2017 0914       RADIOGRAPHIC STUDIES:  Mr Jeri Cos IF Contrast  Result Date: 05/06/2017 CLINICAL DATA:  Staging non-small cell lung cancer EXAM: MRI HEAD WITHOUT AND WITH CONTRAST TECHNIQUE: Multiplanar, multiecho pulse sequences of the brain and surrounding structures were obtained without and with intravenous contrast. CONTRAST:  26mL MULTIHANCE GADOBENATE DIMEGLUMINE 529 MG/ML IV SOLN COMPARISON:  06/11/2016 FINDINGS: Brain: No enhancement or swelling to suggest metastatic disease. No incidental acute infarct, hemorrhage, hydrocephalus, or collection. Generalized brain  atrophy that is age advanced. No specific atrophy pattern. Mild periventricular chronic microvascular ischemia. Vascular: Allowing for slice selection there is preserved major flow voids. Chronic asymmetry of ICA size, larger on the left where there is also a large posterior communicating artery. Skull and upper cervical spine: Heterogeneous calvarial intensity that is stable and benign-appearing. Cervical spine degeneration. Sinuses/Orbits: Left cataract resection. IMPRESSION: Negative for metastatic disease. Electronically Signed   By: Monte Fantasia M.D.   On: 05/06/2017 10:07     ASSESSMENT/PLAN:  Primary cancer of right lower lobe of lung [facility]) This is a very pleasant 67 year old African-American male recently diagnosed with stage IV (T3, N2, M1 a) non-small cell lung cancer, adenocarcinoma presented with large right lower lobe lung mass in addition to mediastinal lymphadenopathy, bilateral pulmonary nodules as well as highly suspicious malignant pleural effusion diagnosed and February 2018.  The patient was supposed to start stereotactic body radiotherapy to the early stage disease at that time but he was lost to follow-up and did not show up for his radiotherapy. The patient had a recent MRI of the brain and is here to discuss the results and treatment options.  The patient was seen with Dr. Julien Nordmann.  Discussed that the MRI of the brain did not show any evidence of metastatic disease.  PDL 1 and molecular studies are still pending. We had a discussion with the patient and his sister today about his current disease stage, prognosis and treatment options. Explained to the patient that he has an incurable condition and all the treatment will be of palliative nature. Treatment options have previously been discussed including palliative care and hospice referral versus consideration of palliative systemic chemotherapy with carboplatin for AUC of 5, Alimta 500 mg/M2 and Keytruda 200 mg IV every 3  weeks.   The patient would like to proceed with palliative systemic chemotherapy combined with immunotherapy. Adverse effect of the chemotherapy were again reviewed including but not limited to alopecia, myelosuppression, nausea and vomiting, peripheral neuropathy, liver or renal dysfunction as well as the immunotherapy mediated to skin rash, diarrhea, inflammation of the lung, kidney, liver, thyroid or other endocrine dysfunction. The patient has attended a chemotherapy education class already. Recommend for him to proceed with cycle 1 of his treatment today as scheduled. He will have weekly labs. The patient has requested a Port-A-Cath for his chemotherapy.  A referral to interventional radiology has been made for this.  A prescription for EMLA cream has been given to the patient. The patient will follow-up in 3 weeks for evaluation prior to cycle 2 of his treatment.  For weight loss, the patient will be seen by the dietitian in the infusion area later today.  The patient was advised to call immediately if he has any concerning symptoms in the interval. The patient voices understanding  of current disease status and treatment options and is in agreement with the current care plan.  All questions were answered. The patient knows to call the clinic with any problems, questions or concerns. We can certainly see the patient much sooner if necessary.   Orders Placed This Encounter  Procedures  . IR Fluoro Guide CV Line Right    Standing Status:   Future    Standing Expiration Date:   08/03/2018    Order Specific Question:   Reason for exam:    Answer:   PAC placement for chemo. Next treatment due 4/24.    Order Specific Question:   Preferred Imaging Location?    Answer:   [place], [place], AGPCNP-BC, [place] 06/02/17   ADDENDUM: Hematology/Oncology Attending: I had a face-to-face encounter with the patient today.  I recommended his care plan.  This is a very  pleasant 67 years old African-American male with a stage IV non-small cell lung cancer, adenocarcinoma presented with large right lower lobe lung mass in addition to mediastinal lymphadenopathy as well as bilateral pulmonary nodules and malignant pleural effusion.  The patient is here today for evaluation before starting the first cycle of systemic chemotherapy with carboplatin, Alimta and Keytruda.  He is feeling fine with no concerning complaints except for mild fatigue.  His molecular studies are still pending.  He had MRI of the brain that showed no concerning findings for metastatic disease to the brain. I recommended for the patient to proceed with the first cycle of his treatment today as a scheduled. If there is insufficient material for molecular studies, would consider the patient for blood test with [employer] 360. He will come back for follow-up visit in 3 weeks for evaluation before starting the next cycle of his treatment. The patient was advised to call immediately if he has any concerning symptoms in the interval.  Disclaimer: This note was dictated with voice recognition software. Similar sounding words can inadvertently be transcribed and may be missed upon review. Eilleen Kempf, MD 06/02/17

## 2017-06-02 NOTE — Progress Notes (Signed)
Nutrition follow-up completed with patient during infusion for lung cancer. Weight has decreased was documented as 137.3 pounds on April 3 this is down from 147.4 pounds in January. Patient continues to live with a family member who does most of the cooking. His appetite is fair. Reports he has received some powder ensure from the [place] but does not care for it. Patient is requesting an additional case of Ensure liquid.  Nutrition diagnosis: Underweight continues.  Intervention: Educated patient to increase oral nutrition supplements 3 times daily between meals. Provided third complimentary case of Ensure. Reviewed strategies for increasing calories and protein at mealtime. Teach back method was used.  Monitoring evaluation goals: Patient will tolerate increased calories and protein to minimize further weight loss.  Next visit: Wednesday, May 15 during infusion.  **Disclaimer: This note was dictated with voice recognition software. Similar sounding words can inadvertently be transcribed and this note may contain transcription errors which may not have been corrected upon publication of note.**

## 2017-06-02 NOTE — Telephone Encounter (Signed)
Scheduled appt per 4/3 los- called patients sister - still in treatment area- patient will get an updated schedule from the RN in treatment area.

## 2017-06-02 NOTE — Progress Notes (Signed)
Alimta started at 1352 and patient complained of burning at IV sight.  Alimta stopped.  IV site accessed.  No redness or swelling.  Excellent blood return obtained.  IV d/c'd and restarted in left arm at 1420 and alimta restarted.

## 2017-06-06 ENCOUNTER — Other Ambulatory Visit: Payer: Self-pay | Admitting: Radiology

## 2017-06-08 ENCOUNTER — Other Ambulatory Visit: Payer: Self-pay | Admitting: Radiology

## 2017-06-09 ENCOUNTER — Ambulatory Visit (HOSPITAL_COMMUNITY)
Admission: RE | Admit: 2017-06-09 | Discharge: 2017-06-09 | Disposition: A | Discharge status: Home / Self Care | Payer: Medicare Other | Source: Ambulatory Visit | Attending: Oncology | Admitting: Oncology

## 2017-06-09 ENCOUNTER — Encounter (HOSPITAL_COMMUNITY): Payer: Self-pay

## 2017-06-09 ENCOUNTER — Other Ambulatory Visit: Payer: Non-veteran care

## 2017-06-09 ENCOUNTER — Ambulatory Visit (HOSPITAL_COMMUNITY)
Admission: RE | Admit: 2017-06-09 | Discharge: 2017-06-09 | Disposition: A | Discharge status: Home / Self Care | Payer: Medicare Other | Source: Ambulatory Visit | Attending: Internal Medicine | Admitting: Internal Medicine

## 2017-06-09 DIAGNOSIS — C3431 Malignant neoplasm of lower lobe, right bronchus or lung: Secondary | ICD-10-CM | POA: Diagnosis present

## 2017-06-09 DIAGNOSIS — D696 Thrombocytopenia, unspecified: Secondary | ICD-10-CM | POA: Insufficient documentation

## 2017-06-09 LAB — CBC WITH DIFFERENTIAL/PLATELET
BAND NEUTROPHILS: 0 %
BASOS PCT: 0 %
Basophils Absolute: 0 10*3/uL (ref 0.0–0.1)
Blasts: 0 %
EOS ABS: 0 10*3/uL (ref 0.0–0.7)
Eosinophils Relative: 2 %
HCT: 29.1 % — ABNORMAL LOW (ref 39.0–52.0)
HEMOGLOBIN: 10 g/dL — AB (ref 13.0–17.0)
LYMPHS PCT: 38 %
Lymphs Abs: 0.6 10*3/uL — ABNORMAL LOW (ref 0.7–4.0)
MCH: 30.4 pg (ref 26.0–34.0)
MCHC: 34.4 g/dL (ref 30.0–36.0)
MCV: 88.4 fL (ref 78.0–100.0)
METAMYELOCYTES PCT: 1 %
MONO ABS: 0 10*3/uL — AB (ref 0.1–1.0)
Monocytes Relative: 1 %
Myelocytes: 1 %
Neutro Abs: 1 10*3/uL — ABNORMAL LOW (ref 1.7–7.7)
Neutrophils Relative %: 57 %
OTHER: 0 %
PROMYELOCYTES RELATIVE: 0 %
Platelets: 27 10*3/uL — CL (ref 150–400)
RBC: 3.29 MIL/uL — ABNORMAL LOW (ref 4.22–5.81)
RDW: 15.3 % (ref 11.5–15.5)
WBC: 1.6 10*3/uL — ABNORMAL LOW (ref 4.0–10.5)
nRBC: 0 /100 WBC

## 2017-06-09 LAB — COMPREHENSIVE METABOLIC PANEL
ALBUMIN: 2 g/dL — AB (ref 3.5–5.0)
ALT: 28 U/L (ref 17–63)
ANION GAP: 7 (ref 5–15)
AST: 44 U/L — ABNORMAL HIGH (ref 15–41)
Alkaline Phosphatase: 75 U/L (ref 38–126)
BUN: 18 mg/dL (ref 6–20)
CO2: 24 mmol/L (ref 22–32)
Calcium: 8.4 mg/dL — ABNORMAL LOW (ref 8.9–10.3)
Chloride: 98 mmol/L — ABNORMAL LOW (ref 101–111)
Creatinine, Ser: 1.03 mg/dL (ref 0.61–1.24)
GFR calc non Af Amer: >60 mL/min (ref >60)
GLUCOSE: 85 mg/dL (ref 65–99)
POTASSIUM: 4 mmol/L (ref 3.5–5.1)
SODIUM: 129 mmol/L — AB (ref 135–145)
TOTAL PROTEIN: 6.4 g/dL — AB (ref 6.5–8.1)
Total Bilirubin: 1.6 mg/dL — ABNORMAL HIGH (ref 0.3–1.2)

## 2017-06-09 LAB — TSH: TSH: 2.19 u[IU]/mL (ref 0.350–4.500)

## 2017-06-09 LAB — PROTIME-INR
INR: 1.36
PROTHROMBIN TIME: 16.6 s — AB (ref 11.4–15.2)

## 2017-06-09 MED ORDER — CEFAZOLIN SODIUM-DEXTROSE 2-4 GM/100ML-% IV SOLN: 2.0000 g | INTRAVENOUS | Status: DC

## 2017-06-09 MED ORDER — SODIUM CHLORIDE 0.9 % IV SOLN
INTRAVENOUS | Status: DC
  Administered 2017-06-09: 13:00:00 via INTRAVENOUS

## 2017-06-09 NOTE — H&P (Signed)
Referring Physician(s): Curcio,Kristin Mayme Genta  Supervising Physician: Aletta Edouard  Patient Status:  [facility] OP  Chief Complaint:  "I'm here for a port"  Subjective: Patient familiar to IR service from prior right lung mass biopsy on 03/29/17.  He has a history of stage IV adenocarcinoma of the right lung and presents again today for Port-A-Cath placement for palliative chemo/immunotherapy.  He currently denies fever, headache, chest pain, abdominal pain, back pain, nausea or bleeding.  He continues to smoke and has cough with occasional vomiting, dyspnea with exertion.  Past Medical History:  Diagnosis Date  . Alcohol abuse   . Allergy   . Chronic kidney disease   . COPD (chronic obstructive pulmonary disease) ([place])   . Emphysema of lung ([place])   . Heavy cigarette smoker   . Hepatitis C   . Lung cancer [facility])    Past Surgical History:  Procedure Laterality Date  . ESOPHAGOGASTRODUODENOSCOPY (EGD) WITH PROPOFOL N/A 10/15/2014   Procedure: ESOPHAGOGASTRODUODENOSCOPY (EGD) WITH PROPOFOL;  Surgeon: Wonda Horner, MD;  Location: [facility] ENDOSCOPY;  Service: Endoscopy;  Laterality: N/A;    Allergies: Other  Medications: Prior to Admission medications   Medication Sig Start Date End Date Taking? Authorizing Provider  dexamethasone (DECADRON) 4 MG tablet 4 mg po bid the day before, day off and day after chemo 04/27/17  Yes Curt Bears, MD  albuterol (PROVENTIL HFA;VENTOLIN HFA) 108 (90 Base) MCG/ACT inhaler Inhale 2 puffs into the lungs every 6 (six) hours as needed for wheezing or shortness of breath. 04/25/15   Saguier, Percell Miller, PA-C  cholecalciferol (D-VI-SOL) 400 UNIT/ML LIQD Take 400 Units by mouth daily.    [provider]  fluticasone (FLOVENT HFA) 110 MCG/ACT inhaler Inhale 2 puffs into the lungs 2 (two) times daily. 04/25/15   Saguier, Percell Miller, PA-C  folic acid (FOLVITE) 1 MG tablet Take 1 tablet (1 mg total) by mouth daily. 04/27/17   Curt Bears, MD    gabapentin (NEURONTIN) 100 MG capsule Take 1 capsule (100 mg total) by mouth at bedtime. Patient not taking: Reported on 06/02/2017 02/06/15   Saguier, Percell Miller, PA-C  ipratropium (ATROVENT HFA) 17 MCG/ACT inhaler Inhale 2 puffs into the lungs every 6 (six) hours as needed for wheezing. 01/31/15   Saguier, Percell Miller, PA-C  lidocaine-prilocaine (EMLA) cream Apply 1 application topically as needed. 06/02/17   Maryanna Shape, NP  metoCLOPramide (REGLAN) 10 MG tablet Take 1 tablet (10 mg total) by mouth 3 (three) times daily before meals. 02/20/15   Saguier, Percell Miller, PA-C  omeprazole (PRILOSEC) 20 MG capsule Take 1 capsule (20 mg total) by mouth daily. Patient not taking: Reported on 06/02/2017 01/31/15   Saguier, Percell Miller, PA-C  prochlorperazine (COMPAZINE) 10 MG tablet Take 1 tablet (10 mg total) by mouth every 6 (six) hours as needed for nausea or vomiting. 04/27/17   Curt Bears, MD  tamsulosin (FLOMAX) 0.4 MG CAPS capsule Take 1 capsule (0.4 mg total) by mouth daily. Patient not taking: Reported on 06/02/2017 02/20/15   Mackie Pai, PA-C     Vital Signs: Blood pressure 129/83, heart rate 92, temperature 98, respirations 16, O2 sat 98% room air  Physical Exam awake, alert ;cachectic appearing black male; chest with distant breath sounds bilaterally.  Heart with regular rate and rhythm.  Abdomen soft, positive bowel sounds, nontender.  No significant lower extremity edema.  Imaging: No results found.  Labs:  CBC: Recent Labs    03/29/17 0942 04/27/17 1309 06/02/17 0914  WBC 5.2 4.1 5.8  HGB  10.4*  --   --   HCT 30.2* 31.9* 29.9*  PLT 124* 105* 130*    COAGS: Recent Labs    03/29/17 0942 06/09/17 1233  INR 1.89 1.36  APTT 50*  --     BMP: Recent Labs    06/11/16 0831 04/27/17 1309 06/02/17 0914 06/09/17 1233  NA  --  132* 133* 129*  K  --  3.7 3.9 4.0  CL  --  104 107 98*  CO2  --  19* 19* 24  GLUCOSE  --  73 137 85  BUN  --  7 7 18   CALCIUM  --  8.6 8.8 8.4*   CREATININE 1.20 1.00 0.91 1.03  GFRNONAA  --  >60 >60 >60  GFRAA  --  >60 >60 >60    LIVER FUNCTION TESTS: Recent Labs    04/27/17 1309 06/02/17 0914 06/09/17 1233  BILITOT 1.2 0.8 1.6*  AST 48* 34 44*  ALT 20 18 28   ALKPHOS 84 93 75  PROT 7.3 7.4 6.4*  ALBUMIN 1.9* 1.9* 2.0*    Assessment and Plan:  Pt with history of stage IV adenocarcinoma of the right lung ; presents today for Port-A-Cath placement for palliative chemo/immunotherapy. Risks and benefits of image guided port-a-catheter placement was discussed with the patient/sister including, but not limited to bleeding, infection, pneumothorax, or fibrin sheath development and need for additional procedures.  All of the patient's questions were answered, patient is agreeable to proceed. Consent signed and in chart.  Labs pending   Electronically Signed: D. Rowe Robert, PA-C 06/09/2017, 1:36 PM   I spent a total of 25 minutes at the the patient's bedside AND on the patient's hospital floor or unit, greater than 50% of which was counseling/coordinating care for port a cath placement

## 2017-06-09 NOTE — Progress Notes (Signed)
Patient ID: Chad Jeffreys., male   DOB: [birth year], 67 y.o.   MRN: [id number] Due to patient's platelet count of 27k and WBC of 1.6 today Port-A-Cath placement has been postponed until next week per order of Dr. Julien Nordmann.  He is currently scheduled to have  port placed at [facility] on 06/16/17.  Preprocedure instructions were given to patient and sister.

## 2017-06-09 NOTE — Progress Notes (Signed)
CCRITICAL VALUE ALERT  Critical Value:  plt 27  Date & Time Notied:  06/09/17 1410  Provider Notified: Dr Fuller [place], PA  Orders Received/Actions taken: outpt port placement IR procedure canceled.

## 2017-06-09 NOTE — Progress Notes (Signed)
Procedure cancelled due to abnormal platelet and WBC. Rescheduled for [facility] on 06/16/17. Patient and his sister, Elbridge Magowan, verbalize understanding. Patient to front entrance via wheelchair with his sister in attendance. To call the [place] transportation for ride home.

## 2017-06-15 ENCOUNTER — Other Ambulatory Visit: Payer: Self-pay | Admitting: Student

## 2017-06-15 ENCOUNTER — Telehealth: Payer: Self-pay | Admitting: Medical Oncology

## 2017-06-15 NOTE — Telephone Encounter (Signed)
LVM for pt to use salt water oral rinses and to call back for any questions.

## 2017-06-16 ENCOUNTER — Other Ambulatory Visit: Payer: Self-pay | Admitting: Oncology

## 2017-06-16 ENCOUNTER — Other Ambulatory Visit: Payer: Self-pay

## 2017-06-16 ENCOUNTER — Ambulatory Visit (HOSPITAL_COMMUNITY)
Admission: RE | Admit: 2017-06-16 | Discharge: 2017-06-16 | Disposition: A | Discharge status: Home / Self Care | Payer: Medicare Other | Source: Ambulatory Visit | Attending: Oncology | Admitting: Oncology

## 2017-06-16 ENCOUNTER — Encounter (HOSPITAL_COMMUNITY): Payer: Self-pay

## 2017-06-16 ENCOUNTER — Inpatient Hospital Stay (HOSPITAL_COMMUNITY)
Admission: EM | Admit: 2017-06-16 | Discharge: 2017-06-22 | DRG: 180 | Disposition: A | Discharge status: Home / Self Care | Payer: Medicare Other | Attending: Internal Medicine | Admitting: Internal Medicine

## 2017-06-16 ENCOUNTER — Other Ambulatory Visit: Payer: Non-veteran care

## 2017-06-16 ENCOUNTER — Encounter (HOSPITAL_COMMUNITY): Payer: Self-pay | Admitting: Nurse Practitioner

## 2017-06-16 DIAGNOSIS — K219 Gastro-esophageal reflux disease without esophagitis: Secondary | ICD-10-CM | POA: Diagnosis present

## 2017-06-16 DIAGNOSIS — Z806 Family history of leukemia: Secondary | ICD-10-CM

## 2017-06-16 DIAGNOSIS — R55 Syncope and collapse: Secondary | ICD-10-CM | POA: Diagnosis present

## 2017-06-16 DIAGNOSIS — R05 Cough: Secondary | ICD-10-CM | POA: Diagnosis present

## 2017-06-16 DIAGNOSIS — E43 Unspecified severe protein-calorie malnutrition: Secondary | ICD-10-CM

## 2017-06-16 DIAGNOSIS — Z825 Family history of asthma and other chronic lower respiratory diseases: Secondary | ICD-10-CM

## 2017-06-16 DIAGNOSIS — D6481 Anemia due to antineoplastic chemotherapy: Secondary | ICD-10-CM | POA: Diagnosis present

## 2017-06-16 DIAGNOSIS — C3431 Malignant neoplasm of lower lobe, right bronchus or lung: Secondary | ICD-10-CM

## 2017-06-16 DIAGNOSIS — L89153 Pressure ulcer of sacral region, stage 3: Secondary | ICD-10-CM | POA: Diagnosis not present

## 2017-06-16 DIAGNOSIS — Z808 Family history of malignant neoplasm of other organs or systems: Secondary | ICD-10-CM | POA: Insufficient documentation

## 2017-06-16 DIAGNOSIS — R1310 Dysphagia, unspecified: Secondary | ICD-10-CM | POA: Diagnosis not present

## 2017-06-16 DIAGNOSIS — Z7952 Long term (current) use of systemic steroids: Secondary | ICD-10-CM

## 2017-06-16 DIAGNOSIS — K921 Melena: Secondary | ICD-10-CM | POA: Diagnosis not present

## 2017-06-16 DIAGNOSIS — F1010 Alcohol abuse, uncomplicated: Secondary | ICD-10-CM | POA: Insufficient documentation

## 2017-06-16 DIAGNOSIS — N189 Chronic kidney disease, unspecified: Secondary | ICD-10-CM | POA: Diagnosis present

## 2017-06-16 DIAGNOSIS — Z9889 Other specified postprocedural states: Secondary | ICD-10-CM

## 2017-06-16 DIAGNOSIS — B182 Chronic viral hepatitis C: Secondary | ICD-10-CM | POA: Diagnosis present

## 2017-06-16 DIAGNOSIS — D61818 Other pancytopenia: Secondary | ICD-10-CM | POA: Diagnosis present

## 2017-06-16 DIAGNOSIS — K2970 Gastritis, unspecified, without bleeding: Secondary | ICD-10-CM | POA: Diagnosis present

## 2017-06-16 DIAGNOSIS — Z91030 Bee allergy status: Secondary | ICD-10-CM

## 2017-06-16 DIAGNOSIS — J90 Pleural effusion, not elsewhere classified: Secondary | ICD-10-CM

## 2017-06-16 DIAGNOSIS — J449 Chronic obstructive pulmonary disease, unspecified: Secondary | ICD-10-CM | POA: Diagnosis present

## 2017-06-16 DIAGNOSIS — T451X5A Adverse effect of antineoplastic and immunosuppressive drugs, initial encounter: Secondary | ICD-10-CM | POA: Diagnosis present

## 2017-06-16 DIAGNOSIS — B1920 Unspecified viral hepatitis C without hepatic coma: Secondary | ICD-10-CM | POA: Diagnosis present

## 2017-06-16 DIAGNOSIS — D6959 Other secondary thrombocytopenia: Secondary | ICD-10-CM | POA: Diagnosis present

## 2017-06-16 DIAGNOSIS — L8990 Pressure ulcer of unspecified site, unspecified stage: Secondary | ICD-10-CM

## 2017-06-16 DIAGNOSIS — F17210 Nicotine dependence, cigarettes, uncomplicated: Secondary | ICD-10-CM

## 2017-06-16 DIAGNOSIS — E871 Hypo-osmolality and hyponatremia: Secondary | ICD-10-CM | POA: Diagnosis present

## 2017-06-16 DIAGNOSIS — I959 Hypotension, unspecified: Secondary | ICD-10-CM | POA: Diagnosis not present

## 2017-06-16 DIAGNOSIS — K7460 Unspecified cirrhosis of liver: Secondary | ICD-10-CM | POA: Diagnosis present

## 2017-06-16 DIAGNOSIS — Z836 Family history of other diseases of the respiratory system: Secondary | ICD-10-CM

## 2017-06-16 DIAGNOSIS — D649 Anemia, unspecified: Secondary | ICD-10-CM

## 2017-06-16 DIAGNOSIS — K449 Diaphragmatic hernia without obstruction or gangrene: Secondary | ICD-10-CM | POA: Diagnosis present

## 2017-06-16 DIAGNOSIS — Z993 Dependence on wheelchair: Secondary | ICD-10-CM

## 2017-06-16 DIAGNOSIS — Z9109 Other allergy status, other than to drugs and biological substances: Secondary | ICD-10-CM | POA: Insufficient documentation

## 2017-06-16 DIAGNOSIS — E861 Hypovolemia: Secondary | ICD-10-CM | POA: Diagnosis present

## 2017-06-16 DIAGNOSIS — R054 Cough syncope: Secondary | ICD-10-CM

## 2017-06-16 DIAGNOSIS — N400 Enlarged prostate without lower urinary tract symptoms: Secondary | ICD-10-CM | POA: Diagnosis present

## 2017-06-16 DIAGNOSIS — Z79899 Other long term (current) drug therapy: Secondary | ICD-10-CM | POA: Insufficient documentation

## 2017-06-16 DIAGNOSIS — Y92230 Patient room in hospital as the place of occurrence of the external cause: Secondary | ICD-10-CM | POA: Diagnosis present

## 2017-06-16 DIAGNOSIS — J439 Emphysema, unspecified: Secondary | ICD-10-CM | POA: Diagnosis present

## 2017-06-16 DIAGNOSIS — J910 Malignant pleural effusion: Secondary | ICD-10-CM | POA: Diagnosis present

## 2017-06-16 DIAGNOSIS — K5900 Constipation, unspecified: Secondary | ICD-10-CM | POA: Diagnosis not present

## 2017-06-16 DIAGNOSIS — B1710 Acute hepatitis C without hepatic coma: Secondary | ICD-10-CM

## 2017-06-16 DIAGNOSIS — Z681 Body mass index (BMI) 19 or less, adult: Secondary | ICD-10-CM | POA: Diagnosis not present

## 2017-06-16 HISTORY — PX: IR US GUIDE VASC ACCESS RIGHT: IMG2390

## 2017-06-16 HISTORY — PX: IR FLUORO GUIDE PORT INSERTION RIGHT: IMG5741

## 2017-06-16 LAB — CBC WITH DIFFERENTIAL/PLATELET
Basophils Absolute: 0 10*3/uL (ref 0.0–0.1)
Basophils Relative: 1 %
Eosinophils Absolute: 0 10*3/uL (ref 0.0–0.7)
Eosinophils Relative: 0 %
HCT: 17.9 % — ABNORMAL LOW (ref 39.0–52.0)
Hemoglobin: 6.3 g/dL — CL (ref 13.0–17.0)
Lymphocytes Relative: 43 %
Lymphs Abs: 0.6 10*3/uL — ABNORMAL LOW (ref 0.7–4.0)
MCH: 30.6 pg (ref 26.0–34.0)
MCHC: 35.2 g/dL (ref 30.0–36.0)
MCV: 86.9 fL (ref 78.0–100.0)
Monocytes Absolute: 0.4 10*3/uL (ref 0.1–1.0)
Monocytes Relative: 30 %
Neutro Abs: 0.4 10*3/uL — ABNORMAL LOW (ref 1.7–7.7)
Neutrophils Relative %: 26 %
Platelets: 19 10*3/uL — CL (ref 150–400)
RBC: 2.06 MIL/uL — ABNORMAL LOW (ref 4.22–5.81)
RDW: 14.4 % (ref 11.5–15.5)
WBC: 1.4 10*3/uL — CL (ref 4.0–10.5)

## 2017-06-16 LAB — APTT: APTT: 42 s — AB (ref 24–36)

## 2017-06-16 LAB — CBC
HCT: 23 % — ABNORMAL LOW (ref 39.0–52.0)
Hemoglobin: 7.9 g/dL — ABNORMAL LOW (ref 13.0–17.0)
MCH: 29.7 pg (ref 26.0–34.0)
MCHC: 34.3 g/dL (ref 30.0–36.0)
MCV: 86.5 fL (ref 78.0–100.0)
PLATELETS: 19 10*3/uL — AB (ref 150–400)
RBC: 2.66 MIL/uL — AB (ref 4.22–5.81)
RDW: 14.2 % (ref 11.5–15.5)
WBC: 1.9 10*3/uL — AB (ref 4.0–10.5)

## 2017-06-16 LAB — PROTIME-INR
INR: 1.6
PROTHROMBIN TIME: 18.9 s — AB (ref 11.4–15.2)

## 2017-06-16 LAB — COMPREHENSIVE METABOLIC PANEL
ALT: 24 U/L (ref 17–63)
AST: 38 U/L (ref 15–41)
Albumin: 1.9 g/dL — ABNORMAL LOW (ref 3.5–5.0)
Alkaline Phosphatase: 84 U/L (ref 38–126)
Anion gap: 7 (ref 5–15)
BUN: 15 mg/dL (ref 6–20)
CO2: 20 mmol/L — ABNORMAL LOW (ref 22–32)
Calcium: 8.2 mg/dL — ABNORMAL LOW (ref 8.9–10.3)
Chloride: 103 mmol/L (ref 101–111)
Creatinine, Ser: 1.05 mg/dL (ref 0.61–1.24)
GFR calc Af Amer: >60 mL/min (ref >60)
GFR calc non Af Amer: >60 mL/min (ref >60)
Glucose, Bld: 105 mg/dL — ABNORMAL HIGH (ref 65–99)
Potassium: 3.5 mmol/L (ref 3.5–5.1)
Sodium: 130 mmol/L — ABNORMAL LOW (ref 135–145)
Total Bilirubin: 0.9 mg/dL (ref 0.3–1.2)
Total Protein: 6.3 g/dL — ABNORMAL LOW (ref 6.5–8.1)

## 2017-06-16 LAB — AMMONIA: Ammonia: 12 umol/L (ref 9–35)

## 2017-06-16 LAB — PREPARE RBC (CROSSMATCH)

## 2017-06-16 LAB — ABO/RH: ABO/RH(D): O POS

## 2017-06-16 MED ORDER — SODIUM CHLORIDE 0.9 % IV SOLN: INTRAVENOUS | Status: DC

## 2017-06-16 MED ORDER — FENTANYL CITRATE (PF) 100 MCG/2ML IJ SOLN
INTRAMUSCULAR | Status: AC
  Filled 2017-06-16: qty 2

## 2017-06-16 MED ORDER — DESMOPRESSIN ACETATE 4 MCG/ML IJ SOLN
20.0000 ug | Freq: Once | INTRAMUSCULAR | Status: AC
  Administered 2017-06-16: 20 ug via INTRAVENOUS
  Filled 2017-06-16: qty 5

## 2017-06-16 MED ORDER — LIDOCAINE-EPINEPHRINE 1 %-1:100000 IJ SOLN
INTRAMUSCULAR | Status: AC | PRN
  Administered 2017-06-16: 20 mL

## 2017-06-16 MED ORDER — CEFAZOLIN SODIUM-DEXTROSE 2-4 GM/100ML-% IV SOLN
2.0000 g | Freq: Once | INTRAVENOUS | Status: AC
  Administered 2017-06-16: 2 g via INTRAVENOUS

## 2017-06-16 MED ORDER — MIDAZOLAM HCL 2 MG/2ML IJ SOLN
INTRAMUSCULAR | Status: AC | PRN
  Administered 2017-06-16: 0.5 mg via INTRAVENOUS

## 2017-06-16 MED ORDER — ACETAMINOPHEN 650 MG RE SUPP: 650.0000 mg | Freq: Four times a day (QID) | RECTAL | Status: DC | PRN

## 2017-06-16 MED ORDER — CEFAZOLIN SODIUM-DEXTROSE 2-4 GM/100ML-% IV SOLN
INTRAVENOUS | Status: AC
  Filled 2017-06-16: qty 100

## 2017-06-16 MED ORDER — VITAMIN D (ERGOCALCIFEROL) 1.25 MG (50000 UNIT) PO CAPS
50000.0000 [IU] | ORAL_CAPSULE | ORAL | Status: DC
Start: 2017-06-17 — End: 2017-06-22
  Administered 2017-06-17: 50000 [IU] via ORAL
  Filled 2017-06-16: qty 1

## 2017-06-16 MED ORDER — SODIUM CHLORIDE 0.9 % IV SOLN
INTRAVENOUS | Status: AC
  Administered 2017-06-17: 04:00:00 via INTRAVENOUS

## 2017-06-16 MED ORDER — SODIUM CHLORIDE 0.9 % IV SOLN
Freq: Once | INTRAVENOUS | Status: AC
  Administered 2017-06-17: via INTRAVENOUS

## 2017-06-16 MED ORDER — FOLIC ACID 1 MG PO TABS
1.0000 mg | ORAL_TABLET | Freq: Every day | ORAL | Status: DC
  Administered 2017-06-17 – 2017-06-22 (×4): 1 mg via ORAL
  Filled 2017-06-16 (×4): qty 1

## 2017-06-16 MED ORDER — TRAMADOL HCL 50 MG PO TABS
50.0000 mg | ORAL_TABLET | Freq: Four times a day (QID) | ORAL | Status: DC | PRN
Start: 2017-06-16 — End: 2017-06-22
  Administered 2017-06-17: 50 mg via ORAL
  Filled 2017-06-16: qty 1

## 2017-06-16 MED ORDER — ALBUTEROL SULFATE (2.5 MG/3ML) 0.083% IN NEBU: 3.0000 mL | INHALATION_SOLUTION | Freq: Four times a day (QID) | RESPIRATORY_TRACT | Status: DC | PRN

## 2017-06-16 MED ORDER — UMECLIDINIUM-VILANTEROL 62.5-25 MCG/INH IN AEPB
1.0000 | INHALATION_SPRAY | Freq: Every day | RESPIRATORY_TRACT | Status: DC
  Administered 2017-06-17 – 2017-06-22 (×5): 1 via RESPIRATORY_TRACT
  Filled 2017-06-16: qty 14

## 2017-06-16 MED ORDER — HEPARIN SOD (PORK) LOCK FLUSH 100 UNIT/ML IV SOLN
INTRAVENOUS | Status: AC
  Administered 2017-06-16: 10:00:00
  Filled 2017-06-16: qty 5

## 2017-06-16 MED ORDER — PANTOPRAZOLE SODIUM 40 MG PO TBEC
40.0000 mg | DELAYED_RELEASE_TABLET | Freq: Every day | ORAL | Status: DC
  Administered 2017-06-17 – 2017-06-19 (×2): 40 mg via ORAL
  Filled 2017-06-16 (×2): qty 1

## 2017-06-16 MED ORDER — ACETAMINOPHEN 325 MG PO TABS
650.0000 mg | ORAL_TABLET | Freq: Four times a day (QID) | ORAL | Status: DC | PRN
  Filled 2017-06-16: qty 2

## 2017-06-16 MED ORDER — MIDAZOLAM HCL 2 MG/2ML IJ SOLN
INTRAMUSCULAR | Status: AC
  Filled 2017-06-16: qty 2

## 2017-06-16 MED ORDER — PROCHLORPERAZINE MALEATE 10 MG PO TABS: 10.0000 mg | ORAL_TABLET | Freq: Four times a day (QID) | ORAL | Status: DC | PRN

## 2017-06-16 MED ORDER — FENTANYL CITRATE (PF) 100 MCG/2ML IJ SOLN
INTRAMUSCULAR | Status: AC | PRN
  Administered 2017-06-16 (×2): 12.5 ug via INTRAVENOUS

## 2017-06-16 MED ORDER — PRO-STAT SUGAR FREE PO LIQD
30.0000 mL | Freq: Two times a day (BID) | ORAL | Status: DC
  Administered 2017-06-17: 30 mL via ORAL
  Filled 2017-06-16: qty 30

## 2017-06-16 MED ORDER — LIDOCAINE-EPINEPHRINE (PF) 1 %-1:200000 IJ SOLN
INTRAMUSCULAR | Status: AC
  Filled 2017-06-16: qty 30

## 2017-06-16 NOTE — Sedation Documentation (Signed)
Patient is resting comfortably. 

## 2017-06-16 NOTE — Sedation Documentation (Signed)
Called short stay for bed they advised they will call me back

## 2017-06-16 NOTE — ED Provider Notes (Signed)
[facility] DEPT Provider Note   CSN: [id number] Arrival date & time: 06/16/17  1711     History   Chief Complaint No chief complaint on file.   HPI Chad Sparks. is a 67 y.o. male.  HPI   67 year old male with syncopal events.  Patient reports 4-5 episodes of syncope today.  Each episode was associated with preceding coughing.  Has a history of lung cancer reports that he will occasionally have a syncopal event after coughing.  Previously this happened every 1-2 weeks.  She became concerned when it happened to him multiple times.  His denies any other preceding symptoms such as chest pain, palpitations, nausea, diaphoresis or dyspnea.  He says he will does begin coughing and the next thing he remembers is waking up.  Family member at bedside.  She reports witnessing these episodes.  No seizure-like activity.  No incontinence.  Episodes last a few seconds to up to perhaps a minute.  Quick return to baseline. Currently complaining of feeling generally tired.   Past Medical History:  Diagnosis Date  . Alcohol abuse   . Allergy   . Chronic kidney disease   . COPD (chronic obstructive pulmonary disease) ([place])   . Emphysema of lung ([place])   . Heavy cigarette smoker   . Hepatitis C   . Lung cancer [facility])     Patient Active Problem List   Diagnosis Date Noted  . Goals of care, counseling/discussion 06/02/2017  . Encounter for antineoplastic chemotherapy 06/02/2017  . Encounter for antineoplastic immunotherapy 06/02/2017  . Primary cancer of right lower lobe of lung ([place]) 05/25/2016  . GERD (gastroesophageal reflux disease) 01/31/2015  . Lower extremity pain 01/31/2015  . Anemia 01/31/2015  . Symptomatic anemia 11/26/2014  . CKD (chronic kidney disease) 11/26/2014  . Pedal edema 11/26/2014  . GIB (gastrointestinal bleeding) 10/15/2014  . AKI (acute kidney injury) ([place]) 10/15/2014  . Pancytopenia ([place]) 10/15/2014  . Hepatitis C   . COPD  (chronic obstructive pulmonary disease) ([place])   . Heavy cigarette smoker   . Alcohol abuse   . Acute hepatitis C virus infection without hepatic coma   . Acute upper GI bleed     Past Surgical History:  Procedure Laterality Date  . ESOPHAGOGASTRODUODENOSCOPY (EGD) WITH PROPOFOL N/A 10/15/2014   Procedure: ESOPHAGOGASTRODUODENOSCOPY (EGD) WITH PROPOFOL;  Surgeon: Wonda Horner, MD;  Location: [facility] ENDOSCOPY;  Service: Endoscopy;  Laterality: N/A;  . IR FLUORO GUIDE PORT INSERTION RIGHT  06/16/2017  . IR US GUIDE VASC ACCESS RIGHT  06/16/2017        Home Medications    Prior to Admission medications   Medication Sig Start Date End Date Taking? Authorizing Provider  Calcium 200 MG TABS Take 200 mg by mouth daily.   Yes [provider]  dexamethasone (DECADRON) 4 MG tablet 4 mg po bid the day before, day off and day after chemo Patient taking differently: Take 4 mg by mouth See admin instructions. Take 4 mg by mouth twice the day before, day off and day after chemo 04/27/17  Yes Curt Bears, MD  folic acid (FOLVITE) 1 MG tablet Take 1 tablet (1 mg total) by mouth daily. 04/27/17  Yes Curt Bears, MD  prochlorperazine (COMPAZINE) 10 MG tablet Take 1 tablet (10 mg total) by mouth every 6 (six) hours as needed for nausea or vomiting. 04/27/17  Yes Curt Bears, MD  Vitamin D, Ergocalciferol, (DRISDOL) 50000 units CAPS capsule Take 50,000 Units by mouth every  7 (seven) days.   Yes [provider]  albuterol (PROVENTIL HFA;VENTOLIN HFA) 108 (90 Base) MCG/ACT inhaler Inhale 2 puffs into the lungs every 6 (six) hours as needed for wheezing or shortness of breath. Patient not taking: Reported on 06/14/2017 04/25/15   Saguier, Percell Miller, PA-C  fluticasone (FLOVENT HFA) 110 MCG/ACT inhaler Inhale 2 puffs into the lungs 2 (two) times daily. Patient not taking: Reported on 06/14/2017 04/25/15   Saguier, Percell Miller, PA-C  gabapentin (NEURONTIN) 100 MG capsule Take 1 capsule (100 mg total)  by mouth at bedtime. Patient not taking: Reported on 06/02/2017 02/06/15   Saguier, Percell Miller, PA-C  ipratropium (ATROVENT HFA) 17 MCG/ACT inhaler Inhale 2 puffs into the lungs every 6 (six) hours as needed for wheezing. Patient not taking: Reported on 06/16/2017 01/31/15   Saguier, Percell Miller, PA-C  lidocaine-prilocaine (EMLA) cream Apply 1 application topically as needed. 06/02/17   Maryanna Shape, NP  metoCLOPramide (REGLAN) 10 MG tablet Take 1 tablet (10 mg total) by mouth 3 (three) times daily before meals. Patient not taking: Reported on 06/16/2017 02/20/15   Saguier, Percell Miller, PA-C  omeprazole (PRILOSEC) 20 MG capsule Take 1 capsule (20 mg total) by mouth daily. Patient not taking: Reported on 06/16/2017 01/31/15   Saguier, Percell Miller, PA-C  tamsulosin (FLOMAX) 0.4 MG CAPS capsule Take 1 capsule (0.4 mg total) by mouth daily. Patient not taking: Reported on 06/16/2017 02/20/15   Saguier, Percell Miller, PA-C    Family History Family History  Problem Relation Age of Onset  . Throat cancer Mother   . Cancer Mother        throat  . Emphysema Father   . Brain cancer Brother   . Cancer Brother        brain  . Cancer Paternal Grandmother        leukemia    Social History Social History   Tobacco Use  . Smoking status: Current Every Day Smoker    Packs/day: 0.50    Years: 53.00    Pack years: 26.50    Types: Cigarettes  . Smokeless tobacco: Never Used  Substance Use Topics  . Alcohol use: Yes    Comment:  2-4 beers a month.  . Drug use: No     Allergies   Other   Review of Systems Review of Systems  All systems reviewed and negative, other than as noted in HPI.  Physical Exam Updated Vital Signs BP 100/78 (BP Location: Right Arm)   Pulse 92   Temp 98.3 F (36.8 C) (Oral)   Resp 15   Ht 6' 2.5" (1.892 m)   Wt 62.1 kg (137 lb)   SpO2 97%   BMI 17.35 kg/m   Physical Exam  Constitutional: He appears well-developed and well-nourished. No distress.  Laying in bed. Appears tired, but  not acutely distressed.   HENT:  Head: Normocephalic and atraumatic.  Eyes: Conjunctivae are normal. Right eye exhibits no discharge. Left eye exhibits no discharge.  Neck: Neck supple.  Cardiovascular: Regular rhythm and normal heart sounds. Exam reveals no gallop and no friction rub.  No murmur heard. Mild tachycardia  Pulmonary/Chest: Effort normal and breath sounds normal. No respiratory distress.  Abdominal: Soft. He exhibits no distension. There is no tenderness.  Musculoskeletal: He exhibits no edema or tenderness.  Neurological: He is alert.  Skin: Skin is warm and dry.  Psychiatric: He has a normal mood and affect. His behavior is normal. Thought content normal.  Nursing note and vitals reviewed.    ED  Treatments / Results  Labs (all labs ordered are listed, but only abnormal results are displayed) Labs Reviewed  COMPREHENSIVE METABOLIC PANEL - Abnormal; Notable for the following components:      Result Value   Sodium 130 (*)    CO2 20 (*)    Glucose, Bld 105 (*)    Calcium 8.2 (*)    Total Protein 6.3 (*)    Albumin 1.9 (*)    All other components within normal limits  CBC WITH DIFFERENTIAL/PLATELET - Abnormal; Notable for the following components:   WBC 1.4 (*)    RBC 2.06 (*)    Hemoglobin 6.3 (*)    HCT 17.9 (*)    Platelets 19 (*)    Neutro Abs 0.4 (*)    Lymphs Abs 0.6 (*)    All other components within normal limits  AMMONIA  TYPE AND SCREEN  ABO/RH  PREPARE RBC (CROSSMATCH)    EKG None   EKG:  Rhythm:sinus tachycardia Rate: 100 PR: 120 ms QRS: 84 ms QTc: 428 ms ST segments: NS ST changes. t-wave flattening noted in multiple leads   Radiology Ir US Guide Vasc Access Right  Result Date: 06/16/2017 INDICATION: 67 year old male with newly diagnosed right lower lobe lung cancer. He presents for placement of a port catheter for durable venous access to facilitate chemotherapy. EXAM: IMPLANTED PORT A CATH PLACEMENT WITH ULTRASOUND AND  FLUOROSCOPIC GUIDANCE MEDICATIONS: 2 g Ancef; The antibiotic was administered within an appropriate time interval prior to skin puncture. ANESTHESIA/SEDATION: Versed 0.5 mg IV; Fentanyl 25 mcg IV; Moderate Sedation Time:  26 minutes The patient was continuously monitored during the procedure by the interventional radiology nurse under my direct supervision. FLUOROSCOPY TIME:  0 minutes, 12 seconds (1 mGy) COMPLICATIONS: None immediate. PROCEDURE: The right neck and chest was prepped with chlorhexidine, and draped in the usual sterile fashion using maximum barrier technique (cap and mask, sterile gown, sterile gloves, large sterile sheet, hand hygiene and cutaneous antiseptic). Antibiotic prophylaxis was provided with 2g Ancef administered IV one hour prior to skin incision. Local anesthesia was attained by infiltration with 1% lidocaine with epinephrine. Ultrasound demonstrated patency of the right internal jugular vein, and this was documented with an image. Under real-time ultrasound guidance, this vein was accessed with a 21 gauge micropuncture needle and image documentation was performed. A small dermatotomy was made at the access site with an 11 scalpel. A 0.018" wire was advanced into the SVC and the access needle exchanged for a 35F micropuncture vascular sheath. The 0.018" wire was then removed and a 0.035" wire advanced into the IVC. An appropriate location for the subcutaneous reservoir was selected below the clavicle and an incision was made through the skin and underlying soft tissues. The subcutaneous tissues were then dissected using a combination of blunt and sharp surgical technique and a pocket was formed. A single lumen power injectable portacatheter was then tunneled through the subcutaneous tissues from the pocket to the dermatotomy and the port reservoir placed within the subcutaneous pocket. The venous access site was then serially dilated and a peel away vascular sheath placed over the wire.  The wire was removed and the port catheter advanced into position under fluoroscopic guidance. The catheter tip is positioned in the upper right atrium. This was documented with a spot image. The portacatheter was then tested and found to flush and aspirate well. The port was flushed with saline followed by 100 units/mL heparinized saline. The pocket was then closed in two layers using  first subdermal inverted interrupted absorbable sutures followed by a running subcuticular suture. The epidermis was then sealed with Dermabond. The dermatotomy at the venous access site was also closed with a single inverted subdermal suture and the epidermis sealed with Dermabond. IMPRESSION: Successful placement of a right IJ approach Power Port with ultrasound and fluoroscopic guidance. The catheter is ready for use. Electronically Signed   By: Jacqulynn Cadet M.D.   On: 06/16/2017 10:39   Ir Fluoro Guide Port Insertion Right  Result Date: 06/16/2017 INDICATION: 67 year old male with newly diagnosed right lower lobe lung cancer. He presents for placement of a port catheter for durable venous access to facilitate chemotherapy. EXAM: IMPLANTED PORT A CATH PLACEMENT WITH ULTRASOUND AND FLUOROSCOPIC GUIDANCE MEDICATIONS: 2 g Ancef; The antibiotic was administered within an appropriate time interval prior to skin puncture. ANESTHESIA/SEDATION: Versed 0.5 mg IV; Fentanyl 25 mcg IV; Moderate Sedation Time:  26 minutes The patient was continuously monitored during the procedure by the interventional radiology nurse under my direct supervision. FLUOROSCOPY TIME:  0 minutes, 12 seconds (1 mGy) COMPLICATIONS: None immediate. PROCEDURE: The right neck and chest was prepped with chlorhexidine, and draped in the usual sterile fashion using maximum barrier technique (cap and mask, sterile gown, sterile gloves, large sterile sheet, hand hygiene and cutaneous antiseptic). Antibiotic prophylaxis was provided with 2g Ancef administered IV  one hour prior to skin incision. Local anesthesia was attained by infiltration with 1% lidocaine with epinephrine. Ultrasound demonstrated patency of the right internal jugular vein, and this was documented with an image. Under real-time ultrasound guidance, this vein was accessed with a 21 gauge micropuncture needle and image documentation was performed. A small dermatotomy was made at the access site with an 11 scalpel. A 0.018" wire was advanced into the SVC and the access needle exchanged for a 35F micropuncture vascular sheath. The 0.018" wire was then removed and a 0.035" wire advanced into the IVC. An appropriate location for the subcutaneous reservoir was selected below the clavicle and an incision was made through the skin and underlying soft tissues. The subcutaneous tissues were then dissected using a combination of blunt and sharp surgical technique and a pocket was formed. A single lumen power injectable portacatheter was then tunneled through the subcutaneous tissues from the pocket to the dermatotomy and the port reservoir placed within the subcutaneous pocket. The venous access site was then serially dilated and a peel away vascular sheath placed over the wire. The wire was removed and the port catheter advanced into position under fluoroscopic guidance. The catheter tip is positioned in the upper right atrium. This was documented with a spot image. The portacatheter was then tested and found to flush and aspirate well. The port was flushed with saline followed by 100 units/mL heparinized saline. The pocket was then closed in two layers using first subdermal inverted interrupted absorbable sutures followed by a running subcuticular suture. The epidermis was then sealed with Dermabond. The dermatotomy at the venous access site was also closed with a single inverted subdermal suture and the epidermis sealed with Dermabond. IMPRESSION: Successful placement of a right IJ approach Power Port with  ultrasound and fluoroscopic guidance. The catheter is ready for use. Electronically Signed   By: Jacqulynn Cadet M.D.   On: 06/16/2017 10:39    Procedures Procedures (including critical care time)  CRITICAL CARE Performed by: Virgel Manifold Total critical care time: 35 minutes Critical care time was exclusive of separately billable procedures and treating other patients. Critical care was necessary  to treat or prevent imminent or life-threatening deterioration. Critical care was time spent personally by me on the following activities: development of treatment plan with patient and/or surrogate as well as nursing, discussions with consultants, evaluation of patient's response to treatment, examination of patient, obtaining history from patient or surrogate, ordering and performing treatments and interventions, ordering and review of laboratory studies, ordering and review of radiographic studies, pulse oximetry and re-evaluation of patient's condition.   Medications Ordered in ED Medications  0.9 %  sodium chloride infusion (has no administration in time range)     Initial Impression / Assessment and Plan / ED Course  I have reviewed the triage vital signs and the nursing notes.  Pertinent labs & imaging results that were available during my care of the patient were reviewed by me and considered in my medical decision making (see chart for details).     67 year old male syncope.  Based on his description, sounds like cough syncope.  Hx of lung CA and chronic cough.  He reports he has had syncopal events previously after coughing but these are usually every week or so.  He had multiple syncopal events today.  Anemia likely contributing. Severe pancytopenia. Probably chemo related. Started palliative chemo on 06/02/17. Significant change in all cells lines noted after this date.    Final Clinical Impressions(s) / ED Diagnoses   Final diagnoses:  Cough syncope  Pancytopenia ([place])    Symptomatic anemia    ED Discharge Orders    None       Virgel Manifold, MD 06/16/17 2128

## 2017-06-16 NOTE — Discharge Instructions (Addendum)
Implanted Port Insertion, Care After °This sheet gives you information about how to care for yourself after your procedure. Your health care provider may also give you more specific instructions. If you have problems or questions, contact your health care provider. °What can I expect after the procedure? °After your procedure, it is common to have: °· Discomfort at the port insertion site. °· Bruising on the skin over the port. This should improve over 3-4 days. ° °Follow these instructions at home: °Port care °· After your port is placed, you will get a manufacturer's information card. The card has information about your port. Keep this card with you at all times. °· Take care of the port as told by your health care provider. Ask your health care provider if you or a family member can get training for taking care of the port at home. A home health care nurse may also take care of the port. °· Make sure to remember what type of port you have. °Incision care °· Follow instructions from your health care provider about how to take care of your port insertion site. Make sure you: °? Wash your hands with soap and water before you change your bandage (dressing). If soap and water are not available, use hand sanitizer. °? Change your dressing as told by your health care provider. °? Leave stitches (sutures), skin glue, or adhesive strips in place. These skin closures may need to stay in place for 2 weeks or longer. If adhesive strip edges start to loosen and curl up, you may trim the loose edges. Do not remove adhesive strips completely unless your health care provider tells you to do that. °· Check your port insertion site every day for signs of infection. Check for: °? More redness, swelling, or pain. °? More fluid or blood. °? Warmth. °? Pus or a bad smell. °General instructions °· Do not take baths, swim, or use a hot tub until your health care provider approves. °· Do not lift anything that is heavier than 10 lb (4.5  kg) for a week, or as told by your health care provider. °· Ask your health care provider when it is okay to: °? Return to work or school. °? Resume usual physical activities or sports. °· Do not drive for 24 hours if you were given a medicine to help you relax (sedative). °· Take over-the-counter and prescription medicines only as told by your health care provider. °· Wear a medical alert bracelet in case of an emergency. This will tell any health care providers that you have a port. °· Keep all follow-up visits as told by your health care provider. This is important. °Contact a health care provider if: °· You cannot flush your port with saline as directed, or you cannot draw blood from the port. °· You have a fever or chills. °· You have more redness, swelling, or pain around your port insertion site. °· You have more fluid or blood coming from your port insertion site. °· Your port insertion site feels warm to the touch. °· You have pus or a bad smell coming from the port insertion site. °Get help right away if: °· You have chest pain or shortness of breath. °· You have bleeding from your port that you cannot control. °Summary °· Take care of the port as told by your health care provider. °· Change your dressing as told by your health care provider. °· Keep all follow-up visits as told by your health care provider. °  This information is not intended to replace advice given to you by your health care provider. Make sure you discuss any questions you have with your health care provider. °Document Released: 12/07/2012 Document Revised: 01/08/2016 Document Reviewed: 01/08/2016 °Elsevier Interactive Patient Education © 2017 [employer] °Moderate Conscious Sedation, Adult, Care After °These instructions provide you with information about caring for yourself after your procedure. Your health care provider may also give you more specific instructions. Your treatment has been planned according to current medical  practices, but problems sometimes occur. Call your health care provider if you have any problems or questions after your procedure. °What can I expect after the procedure? °After your procedure, it is common: °· To feel sleepy for several hours. °· To feel clumsy and have poor balance for several hours. °· To have poor judgment for several hours. °· To vomit if you eat too soon. ° °Follow these instructions at home: °For at least 24 hours after the procedure: ° °· Do not: °? Participate in activities where you could fall or become injured. °? Drive. °? Use heavy machinery. °? Drink alcohol. °? Take sleeping pills or medicines that cause drowsiness. °? Make important decisions or sign legal documents. °? Take care of children on your own. °· Rest. °Eating and drinking °· Follow the diet recommended by your health care provider. °· If you vomit: °? Drink water, juice, or soup when you can drink without vomiting. °? Make sure you have little or no nausea before eating solid foods. °General instructions °· Have a responsible adult stay with you until you are awake and alert. °· Take over-the-counter and prescription medicines only as told by your health care provider. °· If you smoke, do not smoke without supervision. °· Keep all follow-up visits as told by your health care provider. This is important. °Contact a health care provider if: °· You keep feeling nauseous or you keep vomiting. °· You feel light-headed. °· You develop a rash. °· You have a fever. °Get help right away if: °· You have trouble breathing. °This information is not intended to replace advice given to you by your health care provider. Make sure you discuss any questions you have with your health care provider. °Document Released: 12/07/2012 Document Revised: 07/22/2015 Document Reviewed: 06/08/2015 °Elsevier Interactive Patient Education © 2018 [employer] ° °

## 2017-06-16 NOTE — Procedures (Signed)
Interventional Radiology Procedure Note  Procedure: Placement of a right IJ approach single lumen PowerPort.  Tip is positioned at the superior cavoatrial junction and catheter is ready for immediate use.  Complications: No immediate Recommendations:  - Ok to shower tomorrow - Do not submerge for 7 days - Routine line care   Signed,  Loveta Dellis K. Gemini Bunte, MD   

## 2017-06-16 NOTE — H&P (Addendum)
TRH H&P   Patient Demographics:    Chad Sparks, is a 67 y.o. male  MRN: [id number]   DOB - October 28, 1950  Admit Date - 06/16/2017  Outpatient Primary MD for the patient is [facility]  Referring MD/NP/PA:   Mila Merry  Outpatient Specialists:     Patient coming from:  home  No chief complaint on file.  Syncope   HPI:    Chad Sparks  is a 67 y.o. male, w Copd, right lung cancer dx 03/29/2017, Hep C, Etoh abuse, Anemia apparently c/o syncope x4 each lasting less than 1 minute and associated with cough.  Pt denies fever, chills, cp, palp, sob, n./v, diarrhea, brbpr.   Pt denies seizure activity or focal weakness, numbness, tingling.   In Ed,  Wbc 1.9, hgb 7.9, Plt 19 Wbc 1.4, Hgb 6.3, Plt 19  Na 130, K 3.5, Bun 15, creatinine 1.05 Alb 1.9 Ammonia 12  Pt will be admitted for syncope and anemia     Review of systems:    In addition to the HPI above,  No Fever-chills, No Headache, No changes with Vision or hearing, No problems swallowing food or Liquids, No Chest pain, Cough or Shortness of Breath, No Abdominal pain, No Nausea or Vommitting, Bowel movements are regular, No Blood in stool or Urine, No dysuria, No new skin rashes or bruises, No new joints pains-aches,  No new weakness, tingling, numbness in any extremity, No recent weight gain or loss, No polyuria, polydypsia or polyphagia, No significant Mental Stressors.  A full 10 point Review of Systems was done, except as stated above, all other Review of Systems were negative.   With Past History of the following :    Past Medical History:  Diagnosis Date  . Alcohol abuse   . Allergy   . Chronic kidney disease   . COPD (chronic obstructive pulmonary disease) ([place])   . Emphysema of lung ([place])   . Heavy cigarette smoker   . Hepatitis C   . Lung cancer [facility])       Past Surgical  History:  Procedure Laterality Date  . ESOPHAGOGASTRODUODENOSCOPY (EGD) WITH PROPOFOL N/A 10/15/2014   Procedure: ESOPHAGOGASTRODUODENOSCOPY (EGD) WITH PROPOFOL;  Surgeon: Wonda Horner, MD;  Location[facility] ENDOSCOPY;  Service: Endoscopy;  Laterality: N/A;  . IR FLUORO GUIDE PORT INSERTION RIGHT  06/16/2017  . IR US GUIDE VASC ACCESS RIGHT  06/16/2017      Social History:     Social History   Tobacco Use  . Smoking status: Current Every Day Smoker    Packs/day: 0.50    Years: 53.00    Pack years: 26.50    Types: Cigarettes  . Smokeless tobacco: Never Used  Substance Use Topics  . Alcohol use: Yes    Comment:  2-4 beers a month.     Lives - at home  Mobility - walks  by self   Family History :     Family History  Problem Relation Age of Onset  . Throat cancer Mother   . Cancer Mother        throat  . Emphysema Father   . Brain cancer Brother   . Cancer Brother        brain  . Cancer Paternal Grandmother        leukemia      Home Medications:   Prior to Admission medications   Medication Sig Start Date End Date Taking? Authorizing Provider  Calcium 200 MG TABS Take 200 mg by mouth daily.   Yes [provider]  dexamethasone (DECADRON) 4 MG tablet 4 mg po bid the day before, day off and day after chemo Patient taking differently: Take 4 mg by mouth See admin instructions. Take 4 mg by mouth twice the day before, day off and day after chemo 04/27/17  Yes Curt Bears, MD  folic acid (FOLVITE) 1 MG tablet Take 1 tablet (1 mg total) by mouth daily. 04/27/17  Yes Curt Bears, MD  prochlorperazine (COMPAZINE) 10 MG tablet Take 1 tablet (10 mg total) by mouth every 6 (six) hours as needed for nausea or vomiting. 04/27/17  Yes Curt Bears, MD  Vitamin D, Ergocalciferol, (DRISDOL) 50000 units CAPS capsule Take 50,000 Units by mouth every 7 (seven) days.   Yes [provider]  albuterol (PROVENTIL HFA;VENTOLIN HFA) 108 (90 Base) MCG/ACT inhaler  Inhale 2 puffs into the lungs every 6 (six) hours as needed for wheezing or shortness of breath. Patient not taking: Reported on 06/14/2017 04/25/15   Saguier, Percell Miller, PA-C  fluticasone (FLOVENT HFA) 110 MCG/ACT inhaler Inhale 2 puffs into the lungs 2 (two) times daily. Patient not taking: Reported on 06/14/2017 04/25/15   Saguier, Percell Miller, PA-C  gabapentin (NEURONTIN) 100 MG capsule Take 1 capsule (100 mg total) by mouth at bedtime. Patient not taking: Reported on 06/02/2017 02/06/15   Saguier, Percell Miller, PA-C  ipratropium (ATROVENT HFA) 17 MCG/ACT inhaler Inhale 2 puffs into the lungs every 6 (six) hours as needed for wheezing. Patient not taking: Reported on 06/16/2017 01/31/15   Saguier, Percell Miller, PA-C  lidocaine-prilocaine (EMLA) cream Apply 1 application topically as needed. 06/02/17   Maryanna Shape, NP  metoCLOPramide (REGLAN) 10 MG tablet Take 1 tablet (10 mg total) by mouth 3 (three) times daily before meals. Patient not taking: Reported on 06/16/2017 02/20/15   Saguier, Percell Miller, PA-C  omeprazole (PRILOSEC) 20 MG capsule Take 1 capsule (20 mg total) by mouth daily. Patient not taking: Reported on 06/16/2017 01/31/15   Saguier, Percell Miller, PA-C  tamsulosin (FLOMAX) 0.4 MG CAPS capsule Take 1 capsule (0.4 mg total) by mouth daily. Patient not taking: Reported on 06/16/2017 02/20/15   Mackie Pai, PA-C     Allergies:     Allergies  Allergen Reactions  . Other Shortness Of Breath and Other (See Comments)    Bee stings, causing breathing problems, itching, hives, welts     Physical Exam:   Vitals  Blood pressure 100/78, pulse 92, temperature 98.3 F (36.8 C), temperature source Oral, resp. rate 15, height 6' 2.5" (1.892 m), weight 62.1 kg (137 lb), SpO2 97 %.   1. General  lying in bed in NAD,    2. Normal affect and insight, Not Suicidal or Homicidal, Awake Alert, Oriented X 3.  3. No F.N deficits, ALL C.Nerves Intact, Strength 5/5 all 4 extremities, Sensation intact all 4 extremities,  Plantars down going.  4.  Ears and Eyes appear Normal, pupil 1.71mmConjunctivae clear, PERRLA. Moist Oral Mucosa.  5. Supple Neck, No JVD, No cervical lymphadenopathy appriciated, No Carotid Bruits.  6. Symmetrical Chest wall movement, Good air movement bilaterally, CTAB.  7. RRR, No Gallops, Rubs or Murmurs, No Parasternal Heave.  8. Positive Bowel Sounds, Abdomen Soft, No tenderness, No organomegaly appriciated,No rebound -guarding or rigidity.  9.  No Cyanosis, Normal Skin Turgor, No Skin Rash or Bruise.  10. Good muscle tone,  joints appear normal , no effusions, Normal ROM.  11. No Palpable Lymph Nodes in Neck or Axillae      Data Review:    CBC Recent Labs  Lab 06/16/17 0721 06/16/17 1858  WBC 1.9* 1.4*  HGB 7.9* 6.3*  HCT 23.0* 17.9*  PLT 19* 19*  MCV 86.5 86.9  MCH 29.7 30.6  MCHC 34.3 35.2  RDW 14.2 14.4  LYMPHSABS  --  0.6*  MONOABS  --  0.4  EOSABS  --  0.0  BASOSABS  --  0.0   ------------------------------------------------------------------------------------------------------------------  Chemistries  Recent Labs  Lab 06/16/17 1858  NA 130*  K 3.5  CL 103  CO2 20*  GLUCOSE 105*  BUN 15  CREATININE 1.05  CALCIUM 8.2*  AST 38  ALT 24  ALKPHOS 84  BILITOT 0.9   ------------------------------------------------------------------------------------------------------------------ estimated creatinine clearance is 59.1 mL/min (by C-G formula based on SCr of 1.05 mg/dL). ------------------------------------------------------------------------------------------------------------------ No results for input(s): TSH, T4TOTAL, T3FREE, THYROIDAB in the last 72 hours.  Invalid input(s): FREET3  Coagulation profile Recent Labs  Lab 06/16/17 0721  INR 1.60   ------------------------------------------------------------------------------------------------------------------- No results for input(s): DDIMER in the last 72  hours. -------------------------------------------------------------------------------------------------------------------  Cardiac Enzymes No results for input(s): CKMB, TROPONINI, MYOGLOBIN in the last 168 hours.  Invalid input(s): CK ------------------------------------------------------------------------------------------------------------------    Component Value Date/Time   BNP 28.2 11/26/2014 1854     ---------------------------------------------------------------------------------------------------------------  Urinalysis    Component Value Date/Time   COLORURINE AMBER BIOCHEMICALS MAY BE AFFECTED BY COLOR (A) 01/18/2009 2327   APPEARANCEUR CLEAR 01/18/2009 2327   LABSPEC 1.026 01/18/2009 2327   PHURINE 6.0 01/18/2009 2327   GLUCOSEU NEGATIVE 01/18/2009 2327   HGBUR NEGATIVE 01/18/2009 2327   BILIRUBINUR neg 02/06/2015 1019   KETONESUR TRACE (A) 01/18/2009 2327   PROTEINUR neg 02/06/2015 1019   PROTEINUR NEGATIVE 01/18/2009 2327   UROBILINOGEN 0.2 02/06/2015 1019   UROBILINOGEN 1.0 01/18/2009 2327   NITRITE neg 02/06/2015 1019   NITRITE NEGATIVE 01/18/2009 2327   LEUKOCYTESUR Negative 02/06/2015 1019    ----------------------------------------------------------------------------------------------------------------   Imaging Results:    Ir US Guide Vasc Access Right  Result Date: 06/16/2017 INDICATION: 67 year old male with newly diagnosed right lower lobe lung cancer. He presents for placement of a port catheter for durable venous access to facilitate chemotherapy. EXAM: IMPLANTED PORT A CATH PLACEMENT WITH ULTRASOUND AND FLUOROSCOPIC GUIDANCE MEDICATIONS: 2 g Ancef; The antibiotic was administered within an appropriate time interval prior to skin puncture. ANESTHESIA/SEDATION: Versed 0.5 mg IV; Fentanyl 25 mcg IV; Moderate Sedation Time:  26 minutes The patient was continuously monitored during the procedure by the interventional radiology nurse under my direct  supervision. FLUOROSCOPY TIME:  0 minutes, 12 seconds (1 mGy) COMPLICATIONS: None immediate. PROCEDURE: The right neck and chest was prepped with chlorhexidine, and draped in the usual sterile fashion using maximum barrier technique (cap and mask, sterile gown, sterile gloves, large sterile sheet, hand hygiene and cutaneous antiseptic). Antibiotic prophylaxis was provided with 2g Ancef administered IV one hour prior to skin incision.  Local anesthesia was attained by infiltration with 1% lidocaine with epinephrine. Ultrasound demonstrated patency of the right internal jugular vein, and this was documented with an image. Under real-time ultrasound guidance, this vein was accessed with a 21 gauge micropuncture needle and image documentation was performed. A small dermatotomy was made at the access site with an 11 scalpel. A 0.018" wire was advanced into the SVC and the access needle exchanged for a 45F micropuncture vascular sheath. The 0.018" wire was then removed and a 0.035" wire advanced into the IVC. An appropriate location for the subcutaneous reservoir was selected below the clavicle and an incision was made through the skin and underlying soft tissues. The subcutaneous tissues were then dissected using a combination of blunt and sharp surgical technique and a pocket was formed. A single lumen power injectable portacatheter was then tunneled through the subcutaneous tissues from the pocket to the dermatotomy and the port reservoir placed within the subcutaneous pocket. The venous access site was then serially dilated and a peel away vascular sheath placed over the wire. The wire was removed and the port catheter advanced into position under fluoroscopic guidance. The catheter tip is positioned in the upper right atrium. This was documented with a spot image. The portacatheter was then tested and found to flush and aspirate well. The port was flushed with saline followed by 100 units/mL heparinized saline. The  pocket was then closed in two layers using first subdermal inverted interrupted absorbable sutures followed by a running subcuticular suture. The epidermis was then sealed with Dermabond. The dermatotomy at the venous access site was also closed with a single inverted subdermal suture and the epidermis sealed with Dermabond. IMPRESSION: Successful placement of a right IJ approach Power Port with ultrasound and fluoroscopic guidance. The catheter is ready for use. Electronically Signed   By: Jacqulynn Cadet M.D.   On: 06/16/2017 10:39   Ir Fluoro Guide Port Insertion Right  Result Date: 06/16/2017 INDICATION: 68 year old male with newly diagnosed right lower lobe lung cancer. He presents for placement of a port catheter for durable venous access to facilitate chemotherapy. EXAM: IMPLANTED PORT A CATH PLACEMENT WITH ULTRASOUND AND FLUOROSCOPIC GUIDANCE MEDICATIONS: 2 g Ancef; The antibiotic was administered within an appropriate time interval prior to skin puncture. ANESTHESIA/SEDATION: Versed 0.5 mg IV; Fentanyl 25 mcg IV; Moderate Sedation Time:  26 minutes The patient was continuously monitored during the procedure by the interventional radiology nurse under my direct supervision. FLUOROSCOPY TIME:  0 minutes, 12 seconds (1 mGy) COMPLICATIONS: None immediate. PROCEDURE: The right neck and chest was prepped with chlorhexidine, and draped in the usual sterile fashion using maximum barrier technique (cap and mask, sterile gown, sterile gloves, large sterile sheet, hand hygiene and cutaneous antiseptic). Antibiotic prophylaxis was provided with 2g Ancef administered IV one hour prior to skin incision. Local anesthesia was attained by infiltration with 1% lidocaine with epinephrine. Ultrasound demonstrated patency of the right internal jugular vein, and this was documented with an image. Under real-time ultrasound guidance, this vein was accessed with a 21 gauge micropuncture needle and image documentation was  performed. A small dermatotomy was made at the access site with an 11 scalpel. A 0.018" wire was advanced into the SVC and the access needle exchanged for a 45F micropuncture vascular sheath. The 0.018" wire was then removed and a 0.035" wire advanced into the IVC. An appropriate location for the subcutaneous reservoir was selected below the clavicle and an incision was made through the skin and underlying soft  tissues. The subcutaneous tissues were then dissected using a combination of blunt and sharp surgical technique and a pocket was formed. A single lumen power injectable portacatheter was then tunneled through the subcutaneous tissues from the pocket to the dermatotomy and the port reservoir placed within the subcutaneous pocket. The venous access site was then serially dilated and a peel away vascular sheath placed over the wire. The wire was removed and the port catheter advanced into position under fluoroscopic guidance. The catheter tip is positioned in the upper right atrium. This was documented with a spot image. The portacatheter was then tested and found to flush and aspirate well. The port was flushed with saline followed by 100 units/mL heparinized saline. The pocket was then closed in two layers using first subdermal inverted interrupted absorbable sutures followed by a running subcuticular suture. The epidermis was then sealed with Dermabond. The dermatotomy at the venous access site was also closed with a single inverted subdermal suture and the epidermis sealed with Dermabond. IMPRESSION: Successful placement of a right IJ approach Power Port with ultrasound and fluoroscopic guidance. The catheter is ready for use. Electronically Signed   By: Jacqulynn Cadet M.D.   On: 06/16/2017 10:39       Assessment & Plan:    Active Problems:   Hepatitis C   Anemia   Syncope  Syncope CT brain Tele Trop I q6h x3 Check orthostatic bp, D dimer, if positive then CTA chest r/o PE Check carotid  ultrasound Check cardiac echo  Pancytopenia  Transfuse 1 units prbc Check cbc in am  Copd Start Anoro 1puff qday Albuterol HFA 2puff q6h prn  Gerd Cont PPI  BPH Apparently not taking flomax?  Vitamin D def Cont Vitamin D  Severe Protein calorie malnutrition prostat 68mL po bid   \DVT Prophylaxis - SCDs   AM Labs Ordered, also please review Full Orders  Family Communication: Admission, patients condition and plan of care including tests being ordered have been discussed with the patient  who indicate understanding and agree with the plan and Code Status.  Code Status FULL CODE  Likely DC to  home  Condition GUARDED   Consults called:   none  Admission status: inpatient  Time spent in minutes : 45   Jani Gravel M.D on 06/16/2017 at 9:22 PM  Between 7am to 7pm - Pager - [phone]. After 7pm go to [email or web address] - password [email or web address]  [facility] - Office  [phone]

## 2017-06-16 NOTE — ED Notes (Signed)
ED TO INPATIENT HANDOFF REPORT  Name/Age/Gender Chad Sparks. 67 y.o. male  Code Status Code Status History    Date Active Date Inactive Code Status Order ID Comments User Context   11/26/2014 2155 11/28/2014 1810 Full Code [id number]  Lavina Hamman, MD ED   10/15/2014 0224 10/19/2014 1814 Full Code [id number]  Ivor Costa, MD Inpatient   10/15/2014 0003 10/15/2014 0224 Full Code [id number]  Varney Biles, MD ED      Home/SNF/Other Home  Chief Complaint syncope  Level of Care/Admitting Diagnosis ED Disposition    ED Disposition Condition [facility] Area: [facility] [[id number]]  Level of Care: Telemetry [5]  Admit to tele based on following criteria: Monitor for Ischemic changes  Admit to tele based on following criteria: Eval of Syncope  Diagnosis: Syncope [206001]  Admitting Physician: Jani Gravel [[id number]]  Attending Physician: Jani Gravel [phone]  Estimated length of stay: past midnight tomorrow  Certification:: I certify this patient will need inpatient services for at least 2 midnights  PT Class (Do Not Modify): Inpatient [101]  PT Acc Code (Do Not Modify): Private [1]       Medical History Past Medical History:  Diagnosis Date  . Alcohol abuse   . Allergy   . Chronic kidney disease   . COPD (chronic obstructive pulmonary disease) ([place])   . Emphysema of lung ([place])   . Heavy cigarette smoker   . Hepatitis C   . Lung cancer ([place])     Allergies Allergies  Allergen Reactions  . Other Shortness Of Breath and Other (See Comments)    Bee stings, causing breathing problems, itching, hives, welts    IV Location/Drains/Wounds Patient Lines/Drains/Airways Status   Active Line/Drains/Airways    Name:   Placement date:   Placement time:   Site:   Days:   Implanted Port 06/16/17 Right Chest   06/16/17    0959    Chest   less than 1   Incision (Closed) 06/16/17 Chest Right;Upper   06/16/17    1015     less than 1           Labs/Imaging Results for orders placed or performed during the hospital encounter of 06/16/17 (from the past 48 hour(s))  Comprehensive metabolic panel     Status: Abnormal   Collection Time: 06/16/17  6:58 PM  Result Value Ref Range   Sodium 130 (L) 135 - 145 mmol/L   Potassium 3.5 3.5 - 5.1 mmol/L   Chloride 103 101 - 111 mmol/L   CO2 20 (L) 22 - 32 mmol/L   Glucose, Bld 105 (H) 65 - 99 mg/dL   BUN 15 6 - 20 mg/dL   Creatinine, Ser 1.05 0.61 - 1.24 mg/dL   Calcium 8.2 (L) 8.9 - 10.3 mg/dL   Total Protein 6.3 (L) 6.5 - 8.1 g/dL   Albumin 1.9 (L) 3.5 - 5.0 g/dL   AST 38 15 - 41 U/L   ALT 24 17 - 63 U/L   Alkaline Phosphatase 84 38 - 126 U/L   Total Bilirubin 0.9 0.3 - 1.2 mg/dL   GFR calc non Af Amer >60 >60 mL/min   GFR calc Af Amer >60 >60 mL/min    Comment: (NOTE) The eGFR has been calculated using the CKD EPI equation. This calculation has not been validated in all clinical situations. eGFR's persistently <60 mL/min signify possible Chronic Kidney Disease.    Anion gap 7 5 - 15  Comment: Performed at [facility], [place] [address] [place], [place] [address]  CBC with Differential     Status: Abnormal   Collection Time: 06/16/17  6:58 PM  Result Value Ref Range   WBC 1.4 (LL) 4.0 - 10.5 K/uL    Comment: REPEATED TO VERIFY CRITICAL RESULT CALLED TO, READ BACK BY AND VERIFIED WITH: Barlow Harrison,L. RN @2001  ON 04.17.19 BY COHEN,K    RBC 2.06 (L) 4.22 - 5.81 MIL/uL   Hemoglobin 6.3 (LL) 13.0 - 17.0 g/dL    Comment: REPEATED TO VERIFY CRITICAL RESULT CALLED TO, READ BACK BY AND VERIFIED WITH: Clarabell Matsuoka, L. RN @2001  ON 04.17.19 BY COHEN,K    HCT 17.9 (L) 39.0 - 52.0 %   MCV 86.9 78.0 - 100.0 fL   MCH 30.6 26.0 - 34.0 pg   MCHC 35.2 30.0 - 36.0 g/dL   RDW 14.4 11.5 - 15.5 %   Platelets 19 (LL) 150 - 400 K/uL    Comment: SPECIMEN CHECKED FOR CLOTS REPEATED TO VERIFY CRITICAL RESULT CALLED TO, READ BACK BY AND VERIFIED WITH: Kayler Buckholtz,L RN @2001  06/16/17 K  COHEN PLATELET COUNT CONFIRMED BY SMEAR CORRECTED ON 04/17 AT 2118: PREVIOUSLY REPORTED AS 19 SPECIMEN CHECKED FOR CLOTS REPEATED TO VERIFY CRITICAL RESULT CALLED TO, READ BACK BY AND VERIFIED WITH: Doaa Kendzierski,L RN @2001  06/16/17 K COHEN    Neutrophils Relative % 26 %   Lymphocytes Relative 43 %   Monocytes Relative 30 %   Eosinophils Relative 0 %   Basophils Relative 1 %   Neutro Abs 0.4 (L) 1.7 - 7.7 K/uL   Lymphs Abs 0.6 (L) 0.7 - 4.0 K/uL   Monocytes Absolute 0.4 0.1 - 1.0 K/uL   Eosinophils Absolute 0.0 0.0 - 0.7 K/uL   Basophils Absolute 0.0 0.0 - 0.1 K/uL   RBC Morphology TARGET CELLS     Comment: POLYCHROMASIA PRESENT Performed at [facility], [place] [address], [place], [place] [address]   Type and screen [place]     Status: None (Preliminary result)   Collection Time: 06/16/17  6:58 PM  Result Value Ref Range   ABO/RH(D) O POS    Antibody Screen NEG    Sample Expiration      06/19/2017 Performed at [facility], [place] [address], [place], [place] [address]    Unit Number [id number]    Blood Component Type RED CELLS,LR    Unit division 00    Status of Unit ALLOCATED    Transfusion Status OK TO TRANSFUSE    Crossmatch Result Compatible   ABO/Rh     Status: None   Collection Time: 06/16/17  6:59 PM  Result Value Ref Range   ABO/RH(D)      Jenetta Downer POS Performed at [facility], [place] [address] [place], [place] [address]   Ammonia     Status: None   Collection Time: 06/16/17  7:58 PM  Result Value Ref Range   Ammonia 12 9 - 35 umol/L    Comment: Performed at [facility], [place] [address], [place], [place] [address]  Prepare RBC     Status: None   Collection Time: 06/16/17  9:00 PM  Result Value Ref Range   Order Confirmation      ORDER PROCESSED BY BLOOD BANK Performed at [facility], [place] [address] [place], [place] [address]    Ir US Guide Vasc Access  Right  Result Date: 06/16/2017 INDICATION: 67 year old male with  newly diagnosed right lower lobe lung cancer. He presents for placement of a port catheter for durable venous access to facilitate chemotherapy. EXAM: IMPLANTED PORT A CATH PLACEMENT WITH ULTRASOUND AND FLUOROSCOPIC GUIDANCE MEDICATIONS: 2 g Ancef; The antibiotic was administered within an appropriate time interval prior to skin puncture. ANESTHESIA/SEDATION: Versed 0.5 mg IV; Fentanyl 25 mcg IV; Moderate Sedation Time:  26 minutes The patient was continuously monitored during the procedure by the interventional radiology nurse under my direct supervision. FLUOROSCOPY TIME:  0 minutes, 12 seconds (1 mGy) COMPLICATIONS: None immediate. PROCEDURE: The right neck and chest was prepped with chlorhexidine, and draped in the usual sterile fashion using maximum barrier technique (cap and mask, sterile gown, sterile gloves, large sterile sheet, hand hygiene and cutaneous antiseptic). Antibiotic prophylaxis was provided with 2g Ancef administered IV one hour prior to skin incision. Local anesthesia was attained by infiltration with 1% lidocaine with epinephrine. Ultrasound demonstrated patency of the right internal jugular vein, and this was documented with an image. Under real-time ultrasound guidance, this vein was accessed with a 21 gauge micropuncture needle and image documentation was performed. A small dermatotomy was made at the access site with an 11 scalpel. A 0.018" wire was advanced into the SVC and the access needle exchanged for a 63F micropuncture vascular sheath. The 0.018" wire was then removed and a 0.035" wire advanced into the IVC. An appropriate location for the subcutaneous reservoir was selected below the clavicle and an incision was made through the skin and underlying soft tissues. The subcutaneous tissues were then dissected using a combination of blunt and sharp surgical technique and a pocket was formed. A single lumen power  injectable portacatheter was then tunneled through the subcutaneous tissues from the pocket to the dermatotomy and the port reservoir placed within the subcutaneous pocket. The venous access site was then serially dilated and a peel away vascular sheath placed over the wire. The wire was removed and the port catheter advanced into position under fluoroscopic guidance. The catheter tip is positioned in the upper right atrium. This was documented with a spot image. The portacatheter was then tested and found to flush and aspirate well. The port was flushed with saline followed by 100 units/mL heparinized saline. The pocket was then closed in two layers using first subdermal inverted interrupted absorbable sutures followed by a running subcuticular suture. The epidermis was then sealed with Dermabond. The dermatotomy at the venous access site was also closed with a single inverted subdermal suture and the epidermis sealed with Dermabond. IMPRESSION: Successful placement of a right IJ approach Power Port with ultrasound and fluoroscopic guidance. The catheter is ready for use. Electronically Signed   By: Jacqulynn Cadet M.D.   On: 06/16/2017 10:39   Ir Fluoro Guide Port Insertion Right  Result Date: 06/16/2017 INDICATION: 67 year old male with newly diagnosed right lower lobe lung cancer. He presents for placement of a port catheter for durable venous access to facilitate chemotherapy. EXAM: IMPLANTED PORT A CATH PLACEMENT WITH ULTRASOUND AND FLUOROSCOPIC GUIDANCE MEDICATIONS: 2 g Ancef; The antibiotic was administered within an appropriate time interval prior to skin puncture. ANESTHESIA/SEDATION: Versed 0.5 mg IV; Fentanyl 25 mcg IV; Moderate Sedation Time:  26 minutes The patient was continuously monitored during the procedure by the interventional radiology nurse under my direct supervision. FLUOROSCOPY TIME:  0 minutes, 12 seconds (1 mGy) COMPLICATIONS: None immediate. PROCEDURE: The right neck and chest  was prepped with chlorhexidine, and draped in the usual sterile fashion using maximum barrier technique (  cap and mask, sterile gown, sterile gloves, large sterile sheet, hand hygiene and cutaneous antiseptic). Antibiotic prophylaxis was provided with 2g Ancef administered IV one hour prior to skin incision. Local anesthesia was attained by infiltration with 1% lidocaine with epinephrine. Ultrasound demonstrated patency of the right internal jugular vein, and this was documented with an image. Under real-time ultrasound guidance, this vein was accessed with a 21 gauge micropuncture needle and image documentation was performed. A small dermatotomy was made at the access site with an 11 scalpel. A 0.018" wire was advanced into the SVC and the access needle exchanged for a 21F micropuncture vascular sheath. The 0.018" wire was then removed and a 0.035" wire advanced into the IVC. An appropriate location for the subcutaneous reservoir was selected below the clavicle and an incision was made through the skin and underlying soft tissues. The subcutaneous tissues were then dissected using a combination of blunt and sharp surgical technique and a pocket was formed. A single lumen power injectable portacatheter was then tunneled through the subcutaneous tissues from the pocket to the dermatotomy and the port reservoir placed within the subcutaneous pocket. The venous access site was then serially dilated and a peel away vascular sheath placed over the wire. The wire was removed and the port catheter advanced into position under fluoroscopic guidance. The catheter tip is positioned in the upper right atrium. This was documented with a spot image. The portacatheter was then tested and found to flush and aspirate well. The port was flushed with saline followed by 100 units/mL heparinized saline. The pocket was then closed in two layers using first subdermal inverted interrupted absorbable sutures followed by a running  subcuticular suture. The epidermis was then sealed with Dermabond. The dermatotomy at the venous access site was also closed with a single inverted subdermal suture and the epidermis sealed with Dermabond. IMPRESSION: Successful placement of a right IJ approach Power Port with ultrasound and fluoroscopic guidance. The catheter is ready for use. Electronically Signed   By: Jacqulynn Cadet M.D.   On: 06/16/2017 10:39    Pending Labs [employer] (From admission, onward)   Start     Ordered   Signed and Held  D-dimer, quantitative (not at [facility])  Add-on,   R     Signed and Held   Signed and Held  Troponin I (q 6hr x 3)  Now then every 6 hours,   R     Signed and Held   Signed and Held  Comprehensive metabolic panel  Tomorrow morning,   R     Signed and Held   Signed and Held  CBC  Tomorrow morning,   R     Signed and Held      Vitals/Pain Today's Vitals   06/16/17 2200 06/16/17 2230 06/16/17 2247 06/16/17 2300  BP: 104/73 108/73 108/73 104/74  Pulse: 94 96 92 90  Resp: (!) 28 (!) 31 (!) 27 (!) 25  Temp:      TempSrc:      SpO2: 96% 97% 99% 98%  Weight:      Height:      PainSc:        Isolation Precautions No active isolations  Medications Medications  0.9 %  sodium chloride infusion (has no administration in time range)  feeding supplement (PRO-STAT SUGAR FREE 64) liquid 30 mL (has no administration in time range)    Mobility non-ambulatory

## 2017-06-16 NOTE — H&P (Signed)
Chief Complaint: Patient was seen in consultation today for lung cancer  Referring Physician(s): [place] R  Supervising Physician: Jacqulynn Cadet  Patient Status: [facility] - Out-pt  History of Present Illness: Chad Sparks. is a 67 y.o. male with past medical history of alcohol abuse, HepC, CKD, COPD, empysema, and recent biopsy-proven lung cancer.  Patient with plans for upcoming chemotherapy.  He is in need of durable venous access.  He presented to [facility] for outpatient Port-A-Cath placement 06/09/17, however was found to have an elevated WBC and low platelet count.  He presents to [facility] today in his usual state of health with plans for re-attempt of Port-A-Cath placement.  He has been NPO. He does not take blood thinners.   Past Medical History:  Diagnosis Date  . Alcohol abuse   . Allergy   . Chronic kidney disease   . COPD (chronic obstructive pulmonary disease) ([place])   . Emphysema of lung ([place])   . Heavy cigarette smoker   . Hepatitis C   . Lung cancer [facility])     Past Surgical History:  Procedure Laterality Date  . ESOPHAGOGASTRODUODENOSCOPY (EGD) WITH PROPOFOL N/A 10/15/2014   Procedure: ESOPHAGOGASTRODUODENOSCOPY (EGD) WITH PROPOFOL;  Surgeon: Wonda Horner, MD;  Location: [facility] ENDOSCOPY;  Service: Endoscopy;  Laterality: N/A;    Allergies: Other  Medications: Prior to Admission medications   Medication Sig Start Date End Date Taking? Authorizing Provider  Calcium 200 MG TABS Take 200 mg by mouth daily.   Yes [provider]  dexamethasone (DECADRON) 4 MG tablet 4 mg po bid the day before, day off and day after chemo Patient taking differently: Take 4 mg by mouth See admin instructions. Take 4 mg by mouth twice the day before, day off and day after chemo 04/27/17  Yes Curt Bears, MD  ergocalciferol (VITAMIN D2) 50000 units capsule Take 50,000 Units by mouth every Sunday.   Yes [provider]  folic acid (FOLVITE) 1 MG tablet Take 1  tablet (1 mg total) by mouth daily. 04/27/17  Yes Curt Bears, MD  lidocaine-prilocaine (EMLA) cream Apply 1 application topically as needed. 06/02/17  Yes Curcio, Roselie Awkward, NP  prochlorperazine (COMPAZINE) 10 MG tablet Take 1 tablet (10 mg total) by mouth every 6 (six) hours as needed for nausea or vomiting. 04/27/17  Yes Curt Bears, MD  albuterol (PROVENTIL HFA;VENTOLIN HFA) 108 (90 Base) MCG/ACT inhaler Inhale 2 puffs into the lungs every 6 (six) hours as needed for wheezing or shortness of breath. Patient not taking: Reported on 06/14/2017 04/25/15   Saguier, Percell Miller, PA-C  fluticasone (FLOVENT HFA) 110 MCG/ACT inhaler Inhale 2 puffs into the lungs 2 (two) times daily. Patient not taking: Reported on 06/14/2017 04/25/15   Saguier, Percell Miller, PA-C  gabapentin (NEURONTIN) 100 MG capsule Take 1 capsule (100 mg total) by mouth at bedtime. Patient not taking: Reported on 06/02/2017 02/06/15   Saguier, Percell Miller, PA-C  ipratropium (ATROVENT HFA) 17 MCG/ACT inhaler Inhale 2 puffs into the lungs every 6 (six) hours as needed for wheezing. Patient not taking: Reported on 06/14/2017 01/31/15   Saguier, Percell Miller, PA-C  metoCLOPramide (REGLAN) 10 MG tablet Take 1 tablet (10 mg total) by mouth 3 (three) times daily before meals. Patient not taking: Reported on 06/14/2017 02/20/15   Saguier, Percell Miller, PA-C  omeprazole (PRILOSEC) 20 MG capsule Take 1 capsule (20 mg total) by mouth daily. Patient not taking: Reported on 06/02/2017 01/31/15   Saguier, Percell Miller, PA-C  tamsulosin (FLOMAX) 0.4 MG CAPS capsule Take  1 capsule (0.4 mg total) by mouth daily. Patient not taking: Reported on 06/02/2017 02/20/15   Saguier, Percell Miller, PA-C     Family History  Problem Relation Age of Onset  . Throat cancer Mother   . Cancer Mother        throat  . Emphysema Father   . Brain cancer Brother   . Cancer Brother        brain  . Cancer Paternal Grandmother        leukemia    Social History   Socioeconomic History  . Marital status:  Single    Spouse name: Not on file  . Number of children: Not on file  . Years of education: Not on file  . Highest education level: Not on file  Occupational History  . Not on file  Social Needs  . Financial resource strain: Not on file  . Food insecurity:    Worry: Not on file    Inability: Not on file  . Transportation needs:    Medical: Not on file    Non-medical: Not on file  Tobacco Use  . Smoking status: Current Every Day Smoker    Packs/day: 0.50    Years: 53.00    Pack years: 26.50    Types: Cigarettes  . Smokeless tobacco: Never Used  Substance and Sexual Activity  . Alcohol use: Yes    Comment:  2-4 beers a month.  . Drug use: No  . Sexual activity: Never  Lifestyle  . Physical activity:    Days per week: Not on file    Minutes per session: Not on file  . Stress: Not on file  Relationships  . Social connections:    Talks on phone: Not on file    Gets together: Not on file    Attends religious service: Not on file    Active member of club or organization: Not on file    Attends meetings of clubs or organizations: Not on file    Relationship status: Not on file  Other Topics Concern  . Not on file  Social History Narrative  . Not on file     Review of Systems: A 12 point ROS discussed and pertinent positives are indicated in the HPI above.  All other systems are negative.  Review of Systems  Constitutional: Negative for fatigue and fever.  Respiratory: Positive for cough. Negative for shortness of breath.   Cardiovascular: Negative for chest pain.  Gastrointestinal: Negative for abdominal pain.  Musculoskeletal: Negative for back pain.  Psychiatric/Behavioral: Negative for behavioral problems and confusion.    Vital Signs: BP 98/74   Pulse 94   Temp 97.8 F (36.6 C) (Oral)   Resp 20   Ht 6' 2.5" (1.892 m)   Wt 137 lb (62.1 kg)   SpO2 94%   BMI 17.35 kg/m   Physical Exam  Constitutional: He is oriented to person, place, and time. He  appears well-developed.  Severe muscle and subcutaneous fat wasting.  Cardiovascular: Normal rate, regular rhythm and normal heart sounds.  Pulmonary/Chest: Effort normal and breath sounds normal. No respiratory distress.  Abdominal: Soft. He exhibits no distension. There is no tenderness.  Neurological: He is alert and oriented to person, place, and time.  Skin: Skin is warm and dry.  Psychiatric: He has a normal mood and affect. His behavior is normal. Judgment and thought content normal.  Nursing note and vitals reviewed.    MD Evaluation Airway: WNL Heart: WNL Abdomen: WNL Chest/  Lungs: WNL ASA  Classification: 4 Mallampati/Airway Score: One   Imaging: No results found.  Labs:  CBC: Recent Labs    03/29/17 0942 04/27/17 1309 06/02/17 0914 06/09/17 1233 06/16/17 0721  WBC 5.2 4.1 5.8 1.6* 1.9*  HGB 10.4*  --   --  10.0* 7.9*  HCT 30.2* 31.9* 29.9* 29.1* 23.0*  PLT 124* 105* 130* 27* 19*    COAGS: Recent Labs    03/29/17 0942 06/09/17 1233 06/16/17 0721  INR 1.89 1.36 1.60  APTT 50*  --  42*    BMP: Recent Labs    04/27/17 1309 06/02/17 0914 06/09/17 1233  NA 132* 133* 129*  K 3.7 3.9 4.0  CL 104 107 98*  CO2 19* 19* 24  GLUCOSE 73 137 85  BUN 7 7 18   CALCIUM 8.6 8.8 8.4*  CREATININE 1.00 0.91 1.03  GFRNONAA >60 >60 >60  GFRAA >60 >60 >60    LIVER FUNCTION TESTS: Recent Labs    04/27/17 1309 06/02/17 0914 06/09/17 1233  BILITOT 1.2 0.8 1.6*  AST 48* 34 44*  ALT 20 18 28   ALKPHOS 84 93 75  PROT 7.3 7.4 6.4*  ALBUMIN 1.9* 1.9* 2.0*    TUMOR MARKERS: No results for input(s): AFPTM, CEA, CA199, CHROMGRNA in the last 8760 hours.  Assessment and Plan: Patient with past medical history of COPD, emysema presents with complaint of lung cancer.  IR consulted for Port-A-Cath placement at the request of Dr. Mena Pauls. Patient presents today in their usual state of health.  He has been NPO and is not currently on blood thinners. His labwork  shows further drop in platelet count.  After discussion with Dr. Laurence Ferrari, plan to proceed with Port-A-Cath placement with DDAVP today.   Risks and benefits of image guided port-a-catheter placement was discussed with the patient including, but not limited to bleeding, infection, pneumothorax, or fibrin sheath development and need for additional procedures.  All of the patient's questions were answered, patient is agreeable to proceed. Consent signed and in chart.  Thank you for this interesting consult.  I greatly enjoyed meeting Chad Sparks. and look forward to participating in their care.  A copy of this report was sent to the requesting provider on this date.  Electronically Signed: Docia Barrier, PA 06/16/2017, 9:04 AM   I spent a total of    15 Minutes in face to face in clinical consultation, greater than 50% of which was counseling/coordinating care for lung cancer.

## 2017-06-16 NOTE — Sedation Documentation (Signed)
Lab called critical lab platelets 19 reported to charge nurse who will notify IR doctor.

## 2017-06-16 NOTE — ED Triage Notes (Signed)
Patient had port placed today and has been coughing and more SOB since. Family member feels he looses consciousness when he has his coughing spells. Patient states he feels extremely tired and weak. Patient had port placed due to stage 4 lung cancer.

## 2017-06-17 ENCOUNTER — Other Ambulatory Visit: Payer: Self-pay

## 2017-06-17 ENCOUNTER — Encounter (HOSPITAL_COMMUNITY): Payer: Non-veteran care

## 2017-06-17 ENCOUNTER — Inpatient Hospital Stay (HOSPITAL_COMMUNITY): Payer: Medicare Other

## 2017-06-17 ENCOUNTER — Encounter (HOSPITAL_COMMUNITY): Payer: Self-pay | Admitting: Internal Medicine

## 2017-06-17 ENCOUNTER — Encounter (HOSPITAL_COMMUNITY): Payer: Self-pay

## 2017-06-17 ENCOUNTER — Other Ambulatory Visit (HOSPITAL_COMMUNITY): Payer: Non-veteran care

## 2017-06-17 DIAGNOSIS — E43 Unspecified severe protein-calorie malnutrition: Secondary | ICD-10-CM

## 2017-06-17 DIAGNOSIS — L8990 Pressure ulcer of unspecified site, unspecified stage: Secondary | ICD-10-CM

## 2017-06-17 DIAGNOSIS — J90 Pleural effusion, not elsewhere classified: Secondary | ICD-10-CM

## 2017-06-17 LAB — DIFFERENTIAL
Basophils Absolute: 0 10*3/uL (ref 0.0–0.1)
Basophils Relative: 1 %
Eosinophils Absolute: 0 10*3/uL (ref 0.0–0.7)
Eosinophils Relative: 1 %
Lymphocytes Relative: 52 %
Lymphs Abs: 0.9 10*3/uL (ref 0.7–4.0)
Monocytes Absolute: 0.6 10*3/uL (ref 0.1–1.0)
Monocytes Relative: 36 %
NEUTROS ABS: 0.2 10*3/uL — AB (ref 1.7–7.7)
NRBC: 3 /100{WBCs} — AB
Neutrophils Relative %: 10 %

## 2017-06-17 LAB — COMPREHENSIVE METABOLIC PANEL
ALBUMIN: 1.6 g/dL — AB (ref 3.5–5.0)
ALK PHOS: 70 U/L (ref 38–126)
ALT: 20 U/L (ref 17–63)
AST: 31 U/L (ref 15–41)
Anion gap: 6 (ref 5–15)
BUN: 15 mg/dL (ref 6–20)
CALCIUM: 7.9 mg/dL — AB (ref 8.9–10.3)
CO2: 22 mmol/L (ref 22–32)
CREATININE: 0.9 mg/dL (ref 0.61–1.24)
Chloride: 102 mmol/L (ref 101–111)
GFR calc Af Amer: >60 mL/min (ref >60)
GFR calc non Af Amer: >60 mL/min (ref >60)
GLUCOSE: 99 mg/dL (ref 65–99)
POTASSIUM: 3.5 mmol/L (ref 3.5–5.1)
Sodium: 130 mmol/L — ABNORMAL LOW (ref 135–145)
TOTAL PROTEIN: 5.5 g/dL — AB (ref 6.5–8.1)
Total Bilirubin: 1.8 mg/dL — ABNORMAL HIGH (ref 0.3–1.2)

## 2017-06-17 LAB — CBC
HEMATOCRIT: 22.2 % — AB (ref 39.0–52.0)
Hemoglobin: 7.8 g/dL — ABNORMAL LOW (ref 13.0–17.0)
MCH: 30.4 pg (ref 26.0–34.0)
MCHC: 35.1 g/dL (ref 30.0–36.0)
MCV: 86.4 fL (ref 78.0–100.0)
Platelets: 23 10*3/uL — CL (ref 150–400)
RBC: 2.57 MIL/uL — ABNORMAL LOW (ref 4.22–5.81)
RDW: 14.4 % (ref 11.5–15.5)
WBC: 1.7 10*3/uL — ABNORMAL LOW (ref 4.0–10.5)

## 2017-06-17 LAB — TROPONIN I
Troponin I: <0.03 ng/mL (ref <0.03)
Troponin I: <0.03 ng/mL (ref <0.03)

## 2017-06-17 LAB — D-DIMER, QUANTITATIVE: D-Dimer, Quant: 8.88 ug/mL-FEU — ABNORMAL HIGH (ref 0.00–0.50)

## 2017-06-17 MED ORDER — SODIUM CHLORIDE 0.9% FLUSH
10.0000 mL | INTRAVENOUS | Status: DC | PRN
  Administered 2017-06-19: 10 mL
  Filled 2017-06-17: qty 40

## 2017-06-17 MED ORDER — IOPAMIDOL (ISOVUE-370) INJECTION 76%
100.0000 mL | Freq: Once | INTRAVENOUS | Status: AC | PRN
  Administered 2017-06-17: 80 mL via INTRAVENOUS

## 2017-06-17 MED ORDER — TBO-FILGRASTIM 300 MCG/0.5ML ~~LOC~~ SOSY
300.0000 ug | PREFILLED_SYRINGE | Freq: Once | SUBCUTANEOUS | Status: AC
  Administered 2017-06-17: 300 ug via SUBCUTANEOUS
  Filled 2017-06-17: qty 0.5

## 2017-06-17 MED ORDER — ENSURE ENLIVE PO LIQD
237.0000 mL | Freq: Two times a day (BID) | ORAL | Status: DC
  Administered 2017-06-19: 237 mL via ORAL

## 2017-06-17 MED ORDER — IOPAMIDOL (ISOVUE-370) INJECTION 76%
INTRAVENOUS | Status: AC
  Filled 2017-06-17: qty 100

## 2017-06-17 MED ORDER — IBUPROFEN 200 MG PO TABS
200.0000 mg | ORAL_TABLET | Freq: Once | ORAL | Status: AC
  Administered 2017-06-17: 200 mg via ORAL
  Filled 2017-06-17: qty 1

## 2017-06-17 MED ORDER — METOPROLOL TARTRATE 5 MG/5ML IV SOLN
5.0000 mg | Freq: Once | INTRAVENOUS | Status: AC
  Administered 2017-06-17: 5 mg via INTRAVENOUS
  Filled 2017-06-17: qty 5

## 2017-06-17 NOTE — Progress Notes (Signed)
On call Dr. Maudie Mercury notified Chad Sparks's CT angio done this AM is completed and to look at it. Chad Sparks currently resting in bed. Chad Sparks denies SOB and chest pain. Chad Sparks does not appear to be in distress. VSS. Will continue to monitor Chad Sparks closely.

## 2017-06-17 NOTE — Progress Notes (Signed)
Initial Nutrition Assessment  DOCUMENTATION CODES:   Underweight, Severe malnutrition in context of chronic illness  INTERVENTION:    Ensure Enlive po BID, each supplement provides 350 kcal and 20 grams of protein  Magic cup TID with meals, each supplement provides 290 kcal and 9 grams of protein  NUTRITION DIAGNOSIS:   Severe Malnutrition related to chronic illness, cancer and cancer related treatments as evidenced by percent weight loss, energy intake < 75% for > or equal to 1 month, moderate fat depletion, moderate muscle depletion, severe muscle depletion.  GOAL:   Patient will meet greater than or equal to 90% of their needs  MONITOR:   PO intake, Supplement acceptance, Weight trends, Labs  REASON FOR ASSESSMENT:   Malnutrition Screening Tool    ASSESSMENT:   Patient with PMH significant for COPD, right lung cancer diagjnosed 03/29/2017, Hepatitis C, alcohol abuse, and anemia. Presents this admission with syncope and pancytopenia.    Spoke with pt at bedside. Reports appetite began decreasing right before his cancer diagnosis in January. States he typically picks food throughout the day but can only tolerate a few bites at a time. Denies experiencing taste changes but states "pancakes taste like plastic and some things that aren't green taste like greens." Endorses experiencing swallowing issues with thin liquids but not with whole food. Appetite has not increased since admission. Pt attempted to eat grits and eggs today but did not "have the taste" for it. Pt has been seen by [facility] this month. They discussed ways to increase protein and calories. This RD reiterated information. Pt amendable to supplementation this stay. RD to order.   Pt reports a UBW of 160 lb. Records indicate pt weighed 147 lb 03/04/17 and 132 lb this admission (10.2% wt loss in 4 months, significant for time frame). Nutrition-Focused physical exam completed.   Medications reviewed and include:  folic acid, Vit D Labs reviewed: Na 130 (L)   NUTRITION - FOCUSED PHYSICAL EXAM:    Most Recent Value  Orbital Region  Moderate depletion  Upper Arm Region  Severe depletion  Thoracic and Lumbar Region  Unable to assess  Buccal Region  Moderate depletion  Temple Region  Moderate depletion  Clavicle Bone Region  Severe depletion  Clavicle and Acromion Bone Region  Severe depletion  Scapular Bone Region  Unable to assess  Dorsal Hand  Moderate depletion  Patellar Region  Severe depletion  Anterior Thigh Region  Severe depletion  Posterior Calf Region  Severe depletion  Edema (RD Assessment)  None  Hair  Reviewed  Eyes  Reviewed  Mouth  Reviewed  Skin  Reviewed  Nails  Reviewed     Diet Order:  Diet regular Room service appropriate? Yes; Fluid consistency: Thin  EDUCATION NEEDS:   Education needs have been addressed  Skin:  Skin Assessment: Skin Integrity Issues: Skin Integrity Issues:: Incisions, Stage III Stage III: sacrum Incisions: chest closed  Last BM:  PTA  Height:   Ht Readings from Last 1 Encounters:  06/17/17 6' 2.5" (1.892 m)    Weight:   Wt Readings from Last 1 Encounters:  06/17/17 132 lb 1.6 oz (59.9 kg)    Ideal Body Weight:  86.4 kg  BMI:  Body mass index is 16.73 kg/m.  Estimated Nutritional Needs:   Kcal:  7412-8786 kcal  Protein:  120-130 g  Fluid:  >2.3 L/day    Mariana Single RD, LDN Clinical Nutrition Pager # - [phone]

## 2017-06-17 NOTE — Progress Notes (Signed)
R pleural effusion seen on CTA chest Pulmonary consult requested to evaluate and tx,

## 2017-06-17 NOTE — Progress Notes (Signed)
PROGRESS NOTE    Chad Sparks.  DTO:[id number] DOB: 1950-09-19 DOA: 06/16/2017 PCP: [facility]   Brief Narrative:  67 y.o. male, w Copd, NSCLC dx 03/29/2017, Hep C, Etoh abuse, Anemia presenting with several episodes of syncope associated with cough.   Assessment & Plan:   Active Problems:   Hepatitis C   Anemia   Syncope  Syncope:  Occurs after cough, suspect cough related syncope.  He notes this has been occurring for a few months.   Will hold of on brain imaging for now as no focal neuro sx Tele, sinus.  EKG with NSR. Trop I q6h x3 (negative) Check orthostatic bp (pending) CT chest without evidence of PE Check carotid ultrasound Check cardiac echo  Stage IV NSCLC: follows with Dr. Julien Nordmann.  Receiving palliative chemo with carboplatin, alimta, and keytruda with first dose on 06/02/17.  Had port placed on 4/17.   R sided Pleural Effusion: Will hold off on pulm c/s.  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  Plan to c/s IR for thoracentesis (consider c/s pulm based on results of procedure).  Pt may need platelets prior to procedure with thrombocytopenia.  Anemia: s/p 1 unit pRBC.  Follow H/H.  H/H has dropped since 4/10.    Thrombocytopenia:  Likely 2/2 chemo.  Follow.  May need platelets prior to procedure.   Neutropenia: ANC 200 today, likely 2/2 chemo.  Will give dose of granix, discussed with Dr. Julien Nordmann.  Hyponatremia: mild, follow  Copd Start Anoro 1puff qday Albuterol HFA 2puff q6h prn  Gerd Cont PPI  BPH Consider restarting flomax (pt apparently not taking pta)  Vitamin D def Cont Vitamin D  Severe Protein calorie malnutrition prostat 51mL po bid  Pressure Injury: wound c/s  DVT prophylaxis: SCD Code Status: full  Family Communication:none at bedside Disposition Plan: pending improvement   Consultants:   IR  Procedures:    none  Antimicrobials:  Anti-infectives (From admission, onward)   None     Subjective: Presented due to syncope. Has had for months associated with cough. Occurred 6 times yesterday. Denies CP.  Notes SOB.  Objective: Vitals:   06/17/17 0355 06/17/17 0514 06/17/17 0831 06/17/17 1422  BP: 103/76 97/71  114/87  Pulse: 75 90  (!) 103  Resp: 18 18    Temp: 98.3 F (36.8 C) 98 F (36.7 C)  99.1 F (37.3 C)  TempSrc: Oral Oral  Oral  SpO2: 100% 95% 97% 97%  Weight:  59.9 kg (132 lb 1.6 oz)    Height:        Intake/Output Summary (Last 24 hours) at 06/17/2017 1840 Last data filed at 06/17/2017 0451 Gross per 24 hour  Intake 597.5 ml  Output -  Net 597.5 ml   Filed Weights   06/16/17 1728 06/17/17 0007 06/17/17 0514  Weight: 62.1 kg (137 lb) 58.6 kg (129 lb 3 oz) 59.9 kg (132 lb 1.6 oz)    Examination:  General exam: Appears calm and comfortable, thin Respiratory system: Decreased R sided breath sounds Cardiovascular system: S1 & S2 heard, RRR. No JVD, murmurs, rubs, gallops or clicks. No pedal edema. Gastrointestinal system: Abdomen is nondistended, soft and nontender. No organomegaly or masses felt. Normal bowel sounds heard. Central nervous system: Alert and oriented. No focal neurological deficits. Extremities: Symmetric 5 x 5 power. Skin: stage III sacral decubitus ulcer Psychiatry: Judgement and insight appear normal. Mood & affect appropriate.  Data Reviewed: I have personally reviewed following labs and imaging studies  CBC: Recent Labs  Lab 06/16/17 0721 06/16/17 1858 06/17/17 0506  WBC 1.9* 1.4* 1.7*  NEUTROABS  --  0.4* 0.2*  HGB 7.9* 6.3* 7.8*  HCT 23.0* 17.9* 22.2*  MCV 86.5 86.9 86.4  PLT 19* 19* 23*   Basic Metabolic Panel: Recent Labs  Lab 06/16/17 1858 06/17/17 0506  NA 130* 130*  K 3.5 3.5  CL 103 102  CO2 20* 22  GLUCOSE 105* 99  BUN 15 15  CREATININE 1.05 0.90  CALCIUM 8.2* 7.9*   GFR: Estimated Creatinine Clearance:  66.6 mL/min (by C-G formula based on SCr of 0.9 mg/dL). Liver Function Tests: Recent Labs  Lab 06/16/17 1858 06/17/17 0506  AST 38 31  ALT 24 20  ALKPHOS 84 70  BILITOT 0.9 1.8*  PROT 6.3* 5.5*  ALBUMIN 1.9* 1.6*   No results for input(s): LIPASE, AMYLASE in the last 168 hours. Recent Labs  Lab 06/16/17 1958  AMMONIA 12   Coagulation Profile: Recent Labs  Lab 06/16/17 0721  INR 1.60   Cardiac Enzymes: Recent Labs  Lab 06/17/17 0006 06/17/17 0506 06/17/17 1212  TROPONINI <0.03 <0.03 <0.03   BNP (last 3 results) No results for input(s): PROBNP in the last 8760 hours. HbA1C: No results for input(s): HGBA1C in the last 72 hours. CBG: No results for input(s): GLUCAP in the last 168 hours. Lipid Profile: No results for input(s): CHOL, HDL, LDLCALC, TRIG, CHOLHDL, LDLDIRECT in the last 72 hours. Thyroid Function Tests: No results for input(s): TSH, T4TOTAL, FREET4, T3FREE, THYROIDAB in the last 72 hours. Anemia Panel: No results for input(s): VITAMINB12, FOLATE, FERRITIN, TIBC, IRON, RETICCTPCT in the last 72 hours. Sepsis Labs: No results for input(s): PROCALCITON, LATICACIDVEN in the last 168 hours.  No results found for this or any previous visit (from the past 240 hour(s)).       Radiology Studies: Ct Angio Chest Pe W Or Wo Contrast  Result Date: 06/17/2017 CLINICAL DATA:  Acute onset of cough and shortness of breath. Severe tiredness and generalized weakness. Elevated D-dimer. Current history of Stage IV lung cancer. EXAM: CT ANGIOGRAPHY CHEST WITH CONTRAST TECHNIQUE: Multidetector CT imaging of the chest was performed using the standard protocol during bolus administration of intravenous contrast. Multiplanar CT image reconstructions and MIPs were obtained to evaluate the vascular anatomy. CONTRAST:  64mL ISOVUE-370 IOPAMIDOL (ISOVUE-370) INJECTION 76% COMPARISON:  PET/CT performed 03/17/2017 FINDINGS: Cardiovascular: There is no evidence of pulmonary embolus.  Evaluation for pulmonary embolus is suboptimal in areas of airspace consolidation. The heart is normal in size. The thoracic aorta is grossly unremarkable. Scattered coronary artery calcifications are seen. The great vessels are grossly unremarkable. Mediastinum/Nodes: The mediastinum is grossly unremarkable in appearance. No mediastinal lymphadenopathy is seen. No pericardial effusion is identified. The visualized portions of the thyroid gland is unremarkable. No axillary lymphadenopathy is seen. A right-sided chest port is noted ending at the cavoatrial junction. Soft tissue air is noted about the chest port. Lungs/Pleura: A very large right-sided pleural effusion is noted, occupying nearly the entirety of the right lung. There is near complete collapse of the right lung. A prominent nodule is again noted at the left lung base. No pneumothorax is seen. Upper Abdomen: The visualized portions of the liver and spleen are unremarkable. Ascites is noted at the upper abdomen. Musculoskeletal: No acute osseous abnormalities are identified. The visualized musculature is unremarkable in appearance. Review of the MIP images confirms the above findings.  IMPRESSION: 1. No evidence of pulmonary embolus. 2. Very large right-sided pleural effusion, occupying nearly the entirety of the right lung. Near complete collapse of the right lung. Underlying known metastatic disease is not well characterized. 3. Prominent nodule again noted at the left lung base. 4. Scattered coronary artery calcifications. 5. Ascites noted at the upper abdomen. Electronically Signed   By: Garald Balding M.D.   On: 06/17/2017 04:59   Ir US Guide Vasc Access Right  Result Date: 06/16/2017 INDICATION: 67 year old male with newly diagnosed right lower lobe lung cancer. He presents for placement of a port catheter for durable venous access to facilitate chemotherapy. EXAM: IMPLANTED PORT A CATH PLACEMENT WITH ULTRASOUND AND FLUOROSCOPIC GUIDANCE  MEDICATIONS: 2 g Ancef; The antibiotic was administered within an appropriate time interval prior to skin puncture. ANESTHESIA/SEDATION: Versed 0.5 mg IV; Fentanyl 25 mcg IV; Moderate Sedation Time:  26 minutes The patient was continuously monitored during the procedure by the interventional radiology nurse under my direct supervision. FLUOROSCOPY TIME:  0 minutes, 12 seconds (1 mGy) COMPLICATIONS: None immediate. PROCEDURE: The right neck and chest was prepped with chlorhexidine, and draped in the usual sterile fashion using maximum barrier technique (cap and mask, sterile gown, sterile gloves, large sterile sheet, hand hygiene and cutaneous antiseptic). Antibiotic prophylaxis was provided with 2g Ancef administered IV one hour prior to skin incision. Local anesthesia was attained by infiltration with 1% lidocaine with epinephrine. Ultrasound demonstrated patency of the right internal jugular vein, and this was documented with an image. Under real-time ultrasound guidance, this vein was accessed with a 21 gauge micropuncture needle and image documentation was performed. A small dermatotomy was made at the access site with an 11 scalpel. A 0.018" wire was advanced into the SVC and the access needle exchanged for a 20F micropuncture vascular sheath. The 0.018" wire was then removed and a 0.035" wire advanced into the IVC. An appropriate location for the subcutaneous reservoir was selected below the clavicle and an incision was made through the skin and underlying soft tissues. The subcutaneous tissues were then dissected using a combination of blunt and sharp surgical technique and a pocket was formed. A single lumen power injectable portacatheter was then tunneled through the subcutaneous tissues from the pocket to the dermatotomy and the port reservoir placed within the subcutaneous pocket. The venous access site was then serially dilated and a peel away vascular sheath placed over the wire. The wire was removed and  the port catheter advanced into position under fluoroscopic guidance. The catheter tip is positioned in the upper right atrium. This was documented with a spot image. The portacatheter was then tested and found to flush and aspirate well. The port was flushed with saline followed by 100 units/mL heparinized saline. The pocket was then closed in two layers using first subdermal inverted interrupted absorbable sutures followed by a running subcuticular suture. The epidermis was then sealed with Dermabond. The dermatotomy at the venous access site was also closed with a single inverted subdermal suture and the epidermis sealed with Dermabond. IMPRESSION: Successful placement of a right IJ approach Power Port with ultrasound and fluoroscopic guidance. The catheter is ready for use. Electronically Signed   By: Jacqulynn Cadet M.D.   On: 06/16/2017 10:39   Ir Fluoro Guide Port Insertion Right  Result Date: 06/16/2017 INDICATION: 67 year old male with newly diagnosed right lower lobe lung cancer. He presents for placement of a port catheter for durable venous access to facilitate chemotherapy. EXAM: IMPLANTED PORT A CATH  PLACEMENT WITH ULTRASOUND AND FLUOROSCOPIC GUIDANCE MEDICATIONS: 2 g Ancef; The antibiotic was administered within an appropriate time interval prior to skin puncture. ANESTHESIA/SEDATION: Versed 0.5 mg IV; Fentanyl 25 mcg IV; Moderate Sedation Time:  26 minutes The patient was continuously monitored during the procedure by the interventional radiology nurse under my direct supervision. FLUOROSCOPY TIME:  0 minutes, 12 seconds (1 mGy) COMPLICATIONS: None immediate. PROCEDURE: The right neck and chest was prepped with chlorhexidine, and draped in the usual sterile fashion using maximum barrier technique (cap and mask, sterile gown, sterile gloves, large sterile sheet, hand hygiene and cutaneous antiseptic). Antibiotic prophylaxis was provided with 2g Ancef administered IV one hour prior to skin  incision. Local anesthesia was attained by infiltration with 1% lidocaine with epinephrine. Ultrasound demonstrated patency of the right internal jugular vein, and this was documented with an image. Under real-time ultrasound guidance, this vein was accessed with a 21 gauge micropuncture needle and image documentation was performed. A small dermatotomy was made at the access site with an 11 scalpel. A 0.018" wire was advanced into the SVC and the access needle exchanged for a 43F micropuncture vascular sheath. The 0.018" wire was then removed and a 0.035" wire advanced into the IVC. An appropriate location for the subcutaneous reservoir was selected below the clavicle and an incision was made through the skin and underlying soft tissues. The subcutaneous tissues were then dissected using a combination of blunt and sharp surgical technique and a pocket was formed. A single lumen power injectable portacatheter was then tunneled through the subcutaneous tissues from the pocket to the dermatotomy and the port reservoir placed within the subcutaneous pocket. The venous access site was then serially dilated and a peel away vascular sheath placed over the wire. The wire was removed and the port catheter advanced into position under fluoroscopic guidance. The catheter tip is positioned in the upper right atrium. This was documented with a spot image. The portacatheter was then tested and found to flush and aspirate well. The port was flushed with saline followed by 100 units/mL heparinized saline. The pocket was then closed in two layers using first subdermal inverted interrupted absorbable sutures followed by a running subcuticular suture. The epidermis was then sealed with Dermabond. The dermatotomy at the venous access site was also closed with a single inverted subdermal suture and the epidermis sealed with Dermabond. IMPRESSION: Successful placement of a right IJ approach Power Port with ultrasound and fluoroscopic  guidance. The catheter is ready for use. Electronically Signed   By: Jacqulynn Cadet M.D.   On: 06/16/2017 10:39        Scheduled Meds: . feeding supplement (ENSURE ENLIVE)  237 mL Oral BID BM  . folic acid  1 mg Oral Daily  . pantoprazole  40 mg Oral Daily  . Tbo-filgastrim (GRANIX) SQ  300 mcg Subcutaneous ONCE-1800  . umeclidinium-vilanterol  1 puff Inhalation Daily  . Vitamin D (Ergocalciferol)  50,000 Units Oral Q7 days   Continuous Infusions:   LOS: 1 day    Time spent: over 30 min    Fayrene Helper, MD [facility] Pager [phone]  If 7PM-7AM, please contact night-coverage [email or web address] Password [email or web address] 06/17/2017, 6:40 PM

## 2017-06-17 NOTE — Consult Note (Addendum)
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 Needs foam to protect bony prominences on sacrum, may peel it back to change inner dressing BID. Patient has condom cath to help with incontinence. Pt in poor nutritional state, already receiving supplements. We will not follow, but will remain available to this patient, to nursing, and the medical and/or surgical teams.  Please re-consult if we need to assist further.    Fara Olden, RN-C, WTA-C, [place] [facility] Associate [facility]

## 2017-06-18 ENCOUNTER — Inpatient Hospital Stay (HOSPITAL_COMMUNITY): Payer: Medicare Other

## 2017-06-18 DIAGNOSIS — R55 Syncope and collapse: Secondary | ICD-10-CM

## 2017-06-18 DIAGNOSIS — I509 Heart failure, unspecified: Secondary | ICD-10-CM

## 2017-06-18 LAB — COMPREHENSIVE METABOLIC PANEL
ALK PHOS: 71 U/L (ref 38–126)
ALT: 19 U/L (ref 17–63)
AST: 33 U/L (ref 15–41)
Albumin: 1.7 g/dL — ABNORMAL LOW (ref 3.5–5.0)
Anion gap: 7 (ref 5–15)
BILIRUBIN TOTAL: 1.9 mg/dL — AB (ref 0.3–1.2)
BUN: 14 mg/dL (ref 6–20)
CALCIUM: 7.8 mg/dL — AB (ref 8.9–10.3)
CO2: 20 mmol/L — ABNORMAL LOW (ref 22–32)
CREATININE: 1.06 mg/dL (ref 0.61–1.24)
Chloride: 103 mmol/L (ref 101–111)
GFR calc Af Amer: >60 mL/min (ref >60)
GFR calc non Af Amer: >60 mL/min (ref >60)
GLUCOSE: 88 mg/dL (ref 65–99)
Potassium: 3.6 mmol/L (ref 3.5–5.1)
Sodium: 130 mmol/L — ABNORMAL LOW (ref 135–145)
TOTAL PROTEIN: 5.5 g/dL — AB (ref 6.5–8.1)

## 2017-06-18 LAB — CBC WITH DIFFERENTIAL/PLATELET
BASOS ABS: 0 10*3/uL (ref 0.0–0.1)
BASOS PCT: 1 %
Eosinophils Absolute: 0 10*3/uL (ref 0.0–0.7)
Eosinophils Relative: 0 %
HEMATOCRIT: 24.9 % — AB (ref 39.0–52.0)
HEMOGLOBIN: 8.8 g/dL — AB (ref 13.0–17.0)
LYMPHS PCT: 49 %
Lymphs Abs: 0.7 10*3/uL (ref 0.7–4.0)
MCH: 30.7 pg (ref 26.0–34.0)
MCHC: 35.3 g/dL (ref 30.0–36.0)
MCV: 86.8 fL (ref 78.0–100.0)
MONOS PCT: 35 %
Monocytes Absolute: 0.5 10*3/uL (ref 0.1–1.0)
NEUTROS ABS: 0.2 10*3/uL — AB (ref 1.7–7.7)
Neutrophils Relative %: 15 %
Platelets: 51 10*3/uL — ABNORMAL LOW (ref 150–400)
RBC: 2.87 MIL/uL — ABNORMAL LOW (ref 4.22–5.81)
RDW: 14.9 % (ref 11.5–15.5)
WBC: 1.4 10*3/uL — CL (ref 4.0–10.5)

## 2017-06-18 LAB — ALBUMIN, PLEURAL OR PERITONEAL FLUID

## 2017-06-18 LAB — ECHOCARDIOGRAM COMPLETE
HEIGHTINCHES: 74.5 in
Weight: 2113.6 oz

## 2017-06-18 LAB — APTT: aPTT: 49 seconds — ABNORMAL HIGH (ref 24–36)

## 2017-06-18 LAB — BODY FLUID CELL COUNT WITH DIFFERENTIAL
Eos, Fluid: 0 %
Lymphs, Fluid: 83 %
MONOCYTE-MACROPHAGE-SEROUS FLUID: 16 % — AB (ref 50–90)
Neutrophil Count, Fluid: 1 % (ref 0–25)
WBC FLUID: 1318 uL — AB (ref 0–1000)

## 2017-06-18 LAB — GLUCOSE, PLEURAL OR PERITONEAL FLUID: Glucose, Fluid: 63 mg/dL

## 2017-06-18 LAB — MAGNESIUM: Magnesium: 1.4 mg/dL — ABNORMAL LOW (ref 1.7–2.4)

## 2017-06-18 LAB — LACTATE DEHYDROGENASE, PLEURAL OR PERITONEAL FLUID: LD, Fluid: 222 U/L — ABNORMAL HIGH (ref 3–23)

## 2017-06-18 LAB — PROTIME-INR
INR: 1.75
Prothrombin Time: 20.3 seconds — ABNORMAL HIGH (ref 11.4–15.2)

## 2017-06-18 LAB — PROTEIN, PLEURAL OR PERITONEAL FLUID: Total protein, fluid: 3 g/dL

## 2017-06-18 MED ORDER — LIDOCAINE HCL 1 % IJ SOLN
INTRAMUSCULAR | Status: AC
  Filled 2017-06-18: qty 10

## 2017-06-18 MED ORDER — SODIUM CHLORIDE 0.9 % IV BOLUS
1000.0000 mL | Freq: Once | INTRAVENOUS | Status: AC
  Administered 2017-06-18: 1000 mL via INTRAVENOUS

## 2017-06-18 MED ORDER — SODIUM CHLORIDE 0.9 % IV SOLN
INTRAVENOUS | Status: DC
  Administered 2017-06-19 – 2017-06-22 (×7): via INTRAVENOUS

## 2017-06-18 MED ORDER — MAGNESIUM SULFATE 2 GM/50ML IV SOLN
2.0000 g | Freq: Once | INTRAVENOUS | Status: AC
  Administered 2017-06-18: 2 g via INTRAVENOUS
  Filled 2017-06-18: qty 50

## 2017-06-18 MED ORDER — IBUPROFEN 200 MG PO TABS
200.0000 mg | ORAL_TABLET | Freq: Once | ORAL | Status: AC
  Administered 2017-06-18: 200 mg via ORAL
  Filled 2017-06-18: qty 1

## 2017-06-18 MED ORDER — LIP MEDEX EX OINT
TOPICAL_OINTMENT | CUTANEOUS | Status: DC | PRN
  Administered 2017-06-19: 06:00:00 via TOPICAL
  Filled 2017-06-18: qty 7

## 2017-06-18 MED ORDER — BLISTEX MEDICATED EX OINT
TOPICAL_OINTMENT | CUTANEOUS | Status: DC | PRN
  Filled 2017-06-18: qty 6.3

## 2017-06-18 NOTE — Progress Notes (Signed)
Paged Dr. Maudie Mercury again to notify of continued rise in  temperature. Most recent temp was 102.6 after giving ordered ibuprofen, pt refusing Tylenol at this time.

## 2017-06-18 NOTE — Progress Notes (Signed)
Received order for another dose of 200 mg Ibuprofen PO x once per Dr. Maudie Mercury for continued temp.

## 2017-06-18 NOTE — Progress Notes (Signed)
PROGRESS NOTE    Chad Sparks.  TIR:[id number] DOB: 1951-01-06 DOA: 06/16/2017 PCP: [facility]   Brief Narrative: Patient is a 67 y.o.male,w Copd, NSCLC dx 03/29/2017, Hep C, Etoh abuse, Anemia presenting with several episodes of syncope associated with cough. Patient was also found to have neutropenia on presentation.    Assessment & Plan:   Active Problems:   Hepatitis C   Anemia   Syncope   Pleural effusion   Protein-calorie malnutrition, severe   Pressure injury of skin   Syncope:  Occurs after cough, suspect cough related syncope.  He noted that  this has been occurring for a few months.   Will hold of on brain imaging for now as no focal neuro symptoms seen. Tele, sinus.  EKG with NSR. Trop I q6h x3 (negative) Check orthostatic vitals CT chest without evidence of PE We will start gentle IV fluids.Noted to be hypotensive. Echocardiogram shows normal left ventricle size and function, grade 1 diastolic dysfunction. Carotid Doppler did not show any significant carotid artery stenosis. Will request for physical therapy evaluation.  Patient is wheelchair-bound  Stage IV NSCLC: follows with Dr. Julien Nordmann.  Receiving palliative chemo with carboplatin, alimta, and keytruda with first dose on 06/02/17.  Had port placed on 4/17.   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  Patient underwent thoracocentesis today with removal of 1.7 L of bloody fluid.  Anemia: s/p 1 unit pRBC.  Currently H&H stable   Thrombocytopenia:  Likely 2/2 chemo.    We will continue to follow up.  Neutropenia: Likely 2/2 chemo.  Given a dose of granix on 06/17/17,also discussed with Dr. Julien Nordmann.  Hyponatremia: mild. We will follow-up tomorrow.  COPD Started on bronchodilators. Start Anoro 1puff qday Albuterol HFA 2puff q6h prn  Gerd Cont PPI  BPH Not on  flomax  Vitamin D def Cont Vitamin D  Severe Protein calorie malnutrition Nutrition following.  Pressure Injury: Wound care following  Hypomagnesemia: Supplemented.  Will check the levels tomorrow.    DVT prophylaxis: SCD Code Status: Full Family Communication: None present at the bedside Disposition Plan: Likely home after physical therapy evaluation   Consultants: None  Procedures: None  Antimicrobials: None  Subjective: Patient seen and examined the bedside this morning.  Remains comfortable.  Has a dry cough.  Does not complain of chest pain or shortness of breath.  Objective: Vitals:   06/18/17 1517 06/18/17 1535 06/18/17 1550 06/18/17 1635  BP: 91/66 92/61 (!) 83/60 92/71  Pulse: 97   96  Resp:      Temp: 97.8 F (36.6 C)     TempSrc: Oral     SpO2: 98%   100%  Weight:      Height:        Intake/Output Summary (Last 24 hours) at 06/18/2017 1744 Last data filed at 06/18/2017 0530 Gross per 24 hour  Intake 240 ml  Output 400 ml  Net -160 ml   Filed Weights   06/16/17 1728 06/17/17 0007 06/17/17 0514  Weight: 62.1 kg (137 lb) 58.6 kg (129 lb 3 oz) 59.9 kg (132 lb 1.6 oz)    Examination:  General exam: Appears calm and comfortable ,Not in distress, malnourished, cachectic  HEENT:PERRL,Oral mucosa moist, Ear/Nose normal on gross exam Respiratory system: Decreased air entry on the right side. Cardiovascular system: S1 & S2 heard, RRR. No JVD, murmurs, rubs, gallops or clicks. No pedal  edema.  Chemo-Port on the left chest Gastrointestinal system: Abdomen is nondistended, soft and nontender. No organomegaly or masses felt. Normal bowel sounds heard. Central nervous system: Alert and oriented. No focal neurological deficits.  Generalized weakness Extremities: No edema, no clubbing ,no cyanosis, distal peripheral pulses palpable. Skin: Full-thickness wound on the medial left buttock MSK: Normal muscle bulk,tone ,power Psychiatry: Judgement and insight  appear normal. Mood & affect appropriate.     Data Reviewed: I have personally reviewed following labs and imaging studies  CBC: Recent Labs  Lab 06/16/17 0721 06/16/17 1858 06/17/17 0506 06/18/17 0448  WBC 1.9* 1.4* 1.7* 1.4*  NEUTROABS  --  0.4* 0.2* 0.2*  HGB 7.9* 6.3* 7.8* 8.8*  HCT 23.0* 17.9* 22.2* 24.9*  MCV 86.5 86.9 86.4 86.8  PLT 19* 19* 23* 51*   Basic Metabolic Panel: Recent Labs  Lab 06/16/17 1858 06/17/17 0506 06/18/17 0448  NA 130* 130* 130*  K 3.5 3.5 3.6  CL 103 102 103  CO2 20* 22 20*  GLUCOSE 105* 99 88  BUN 15 15 14   CREATININE 1.05 0.90 1.06  CALCIUM 8.2* 7.9* 7.8*  MG  --   --  1.4*   GFR: Estimated Creatinine Clearance: 56.5 mL/min (by C-G formula based on SCr of 1.06 mg/dL). Liver Function Tests: Recent Labs  Lab 06/16/17 1858 06/17/17 0506 06/18/17 0448  AST 38 31 33  ALT 24 20 19   ALKPHOS 84 70 71  BILITOT 0.9 1.8* 1.9*  PROT 6.3* 5.5* 5.5*  ALBUMIN 1.9* 1.6* 1.7*   No results for input(s): LIPASE, AMYLASE in the last 168 hours. Recent Labs  Lab 06/16/17 1958  AMMONIA 12   Coagulation Profile: Recent Labs  Lab 06/16/17 0721 06/18/17 0448  INR 1.60 1.75   Cardiac Enzymes: Recent Labs  Lab 06/17/17 0006 06/17/17 0506 06/17/17 1212  TROPONINI <0.03 <0.03 <0.03   BNP (last 3 results) No results for input(s): PROBNP in the last 8760 hours. HbA1C: No results for input(s): HGBA1C in the last 72 hours. CBG: No results for input(s): GLUCAP in the last 168 hours. Lipid Profile: No results for input(s): CHOL, HDL, LDLCALC, TRIG, CHOLHDL, LDLDIRECT in the last 72 hours. Thyroid Function Tests: No results for input(s): TSH, T4TOTAL, FREET4, T3FREE, THYROIDAB in the last 72 hours. Anemia Panel: No results for input(s): VITAMINB12, FOLATE, FERRITIN, TIBC, IRON, RETICCTPCT in the last 72 hours. Sepsis Labs: No results for input(s): PROCALCITON, LATICACIDVEN in the last 168 hours.  No results found for this or any  previous visit (from the past 240 hour(s)).       Radiology Studies: Dg Chest 1 View  Result Date: 06/18/2017 CLINICAL DATA:  Status post right thoracentesis. EXAM: CHEST  1 VIEW COMPARISON:  06/17/2017 FINDINGS: Considerable reduction in right-sided pleural effusion is noted although some fluid remains. Improved aeration of the right lung is noted with some persistent consolidation inferiorly. No pneumothorax is noted. Right chest wall port is noted in satisfactory position. Left lung is clear. IMPRESSION: No pneumothorax following thoracentesis on the right. Electronically Signed   By: Inez Catalina M.D.   On: 06/18/2017 16:17   Ct Angio Chest Pe W Or Wo Contrast  Result Date: 06/17/2017 CLINICAL DATA:  Acute onset of cough and shortness of breath. Severe tiredness and generalized weakness. Elevated D-dimer. Current history of Stage IV lung cancer. EXAM: CT ANGIOGRAPHY CHEST WITH CONTRAST TECHNIQUE: Multidetector CT imaging of the chest was performed using the standard protocol during bolus administration of intravenous contrast. Multiplanar CT  image reconstructions and MIPs were obtained to evaluate the vascular anatomy. CONTRAST:  72mL ISOVUE-370 IOPAMIDOL (ISOVUE-370) INJECTION 76% COMPARISON:  PET/CT performed 03/17/2017 FINDINGS: Cardiovascular: There is no evidence of pulmonary embolus. Evaluation for pulmonary embolus is suboptimal in areas of airspace consolidation. The heart is normal in size. The thoracic aorta is grossly unremarkable. Scattered coronary artery calcifications are seen. The great vessels are grossly unremarkable. Mediastinum/Nodes: The mediastinum is grossly unremarkable in appearance. No mediastinal lymphadenopathy is seen. No pericardial effusion is identified. The visualized portions of the thyroid gland is unremarkable. No axillary lymphadenopathy is seen. A right-sided chest port is noted ending at the cavoatrial junction. Soft tissue air is noted about the chest port.  Lungs/Pleura: A very large right-sided pleural effusion is noted, occupying nearly the entirety of the right lung. There is near complete collapse of the right lung. A prominent nodule is again noted at the left lung base. No pneumothorax is seen. Upper Abdomen: The visualized portions of the liver and spleen are unremarkable. Ascites is noted at the upper abdomen. Musculoskeletal: No acute osseous abnormalities are identified. The visualized musculature is unremarkable in appearance. Review of the MIP images confirms the above findings. IMPRESSION: 1. No evidence of pulmonary embolus. 2. Very large right-sided pleural effusion, occupying nearly the entirety of the right lung. Near complete collapse of the right lung. Underlying known metastatic disease is not well characterized. 3. Prominent nodule again noted at the left lung base. 4. Scattered coronary artery calcifications. 5. Ascites noted at the upper abdomen. Electronically Signed   By: Garald Balding M.D.   On: 06/17/2017 04:59   US Thoracentesis Asp Pleural Space W/img Guide  Result Date: 06/18/2017 INDICATION: Patient with history of stage IV non-small cell lung cancer, hepatitis-C, anemia, thrombocytopenia, dyspnea, cough, right pleural effusion. Request made for diagnostic and therapeutic right thoracentesis. EXAM: ULTRASOUND GUIDED DIAGNOSTIC AND THERAPEUTIC RIGHT THORACENTESIS MEDICATIONS: None COMPLICATIONS: None immediate. PROCEDURE: An ultrasound guided thoracentesis was thoroughly discussed with the patient and questions answered. The benefits, risks, alternatives and complications were also discussed. The patient understands and wishes to proceed with the procedure. Written consent was obtained. Ultrasound was performed to localize and mark an adequate pocket of fluid in the right chest. The area was then prepped and draped in the normal sterile fashion. 1% Lidocaine was used for local anesthesia. Under ultrasound guidance a 6 Fr  Safe-T-Centesis catheter was introduced. Thoracentesis was performed. The catheter was removed and a dressing applied. FINDINGS: A total of approximately 1.7 liters of bloody fluid was removed. Samples were sent to the laboratory as requested by the clinical team. Due to patient coughing/chest discomfort/initial thoracentesis/hypotension only the above amount of fluid was removed today. IMPRESSION: Successful ultrasound guided diagnostic and therapeutic right thoracentesis yielding 1.7 liters of pleural fluid. Follow-up chest x-ray revealed no pneumothorax. Read by: Rowe Robert, PA-C Electronically Signed   By: Lucrezia Europe M.D.   On: 06/18/2017 16:17        Scheduled Meds: . feeding supplement (ENSURE ENLIVE)  237 mL Oral BID BM  . folic acid  1 mg Oral Daily  . lidocaine      . pantoprazole  40 mg Oral Daily  . umeclidinium-vilanterol  1 puff Inhalation Daily  . Vitamin D (Ergocalciferol)  50,000 Units Oral Q7 days   Continuous Infusions:   LOS: 2 days    Time spent: 25 mins.More than 50% of that time was spent in counseling and/or coordination of care.      Deakon Frix  Tawanna Solo, MD [facility] Pager [phone]  If 7PM-7AM, please contact night-coverage [email or web address] Password [facility] 06/18/2017, 5:44 PM

## 2017-06-18 NOTE — Progress Notes (Signed)
CRITICAL VALUE ALERT  Critical Value:  WBC 1.4  Date & Time Notied: 06/18/17 at 0538  Provider Notified: Dr. Maudie Mercury paged at [phone]  Orders Received/Actions taken: Received phone call from Dr. Maudie Mercury at [phone], no new orders at this time.

## 2017-06-18 NOTE — Progress Notes (Signed)
  Echocardiogram 2D Echocardiogram has been performed.  Darlina Sicilian M 06/18/2017, 11:01 AM

## 2017-06-18 NOTE — Procedures (Signed)
Ultrasound-guided diagnostic and therapeutic right  thoracentesis performed yielding 1.7 liters of bloody  fluid. No immediate complications. Follow-up chest x-ray pending. The fluid was sent to the lab for preordered studies. Due to pt coughing/chest discomfort/initial thoracentesis/hypotension only the above amount of fluid was removed today.

## 2017-06-18 NOTE — Progress Notes (Addendum)
Patient returned from US IR. Per note 1.7lt pulled off. See flow sheet for VS. No changes from am assessment. Band-Aid to Rt back.small amount of bloody drainage present. Will continue to monitor. Pt denies pain

## 2017-06-18 NOTE — Progress Notes (Signed)
Bilateral carotid duplex completed. Preliminary results. 1% to 39% ICA stenosis. Vertebral artery flow is antegrade. [place] Alexzandra Bilton,RVS 06/18/2017 1:15 PM

## 2017-06-19 LAB — CBC WITH DIFFERENTIAL/PLATELET
BAND NEUTROPHILS: 10 %
BASOS ABS: 0 10*3/uL (ref 0.0–0.1)
BASOS PCT: 1 %
EOS ABS: 0.1 10*3/uL (ref 0.0–0.7)
EOS PCT: 2 %
HEMATOCRIT: 22.7 % — AB (ref 39.0–52.0)
HEMOGLOBIN: 7.9 g/dL — AB (ref 13.0–17.0)
LYMPHS PCT: 23 %
Lymphs Abs: 0.7 10*3/uL (ref 0.7–4.0)
MCH: 30.5 pg (ref 26.0–34.0)
MCHC: 34.8 g/dL (ref 30.0–36.0)
MCV: 87.6 fL (ref 78.0–100.0)
Metamyelocytes Relative: 1 %
Monocytes Absolute: 1.5 10*3/uL — ABNORMAL HIGH (ref 0.1–1.0)
Monocytes Relative: 49 %
Neutro Abs: 0.8 10*3/uL — ABNORMAL LOW (ref 1.7–7.7)
Neutrophils Relative %: 14 %
Platelets: 72 10*3/uL — ABNORMAL LOW (ref 150–400)
RBC: 2.59 MIL/uL — ABNORMAL LOW (ref 4.22–5.81)
RDW: 15.1 % (ref 11.5–15.5)
WBC: 3.1 10*3/uL — ABNORMAL LOW (ref 4.0–10.5)

## 2017-06-19 LAB — PH, BODY FLUID: pH, Body Fluid: 7.4

## 2017-06-19 LAB — PREPARE RBC (CROSSMATCH)

## 2017-06-19 LAB — BASIC METABOLIC PANEL
ANION GAP: 4 — AB (ref 5–15)
BUN: 16 mg/dL (ref 6–20)
CHLORIDE: 109 mmol/L (ref 101–111)
CO2: 22 mmol/L (ref 22–32)
Calcium: 7.7 mg/dL — ABNORMAL LOW (ref 8.9–10.3)
Creatinine, Ser: 0.98 mg/dL (ref 0.61–1.24)
GFR calc non Af Amer: >60 mL/min (ref >60)
GLUCOSE: 111 mg/dL — AB (ref 65–99)
POTASSIUM: 3.9 mmol/L (ref 3.5–5.1)
Sodium: 135 mmol/L (ref 135–145)

## 2017-06-19 LAB — OCCULT BLOOD X 1 CARD TO LAB, STOOL: Fecal Occult Bld: POSITIVE — AB

## 2017-06-19 LAB — MAGNESIUM: MAGNESIUM: 1.8 mg/dL (ref 1.7–2.4)

## 2017-06-19 MED ORDER — HYDROCOD POLST-CPM POLST ER 10-8 MG/5ML PO SUER
5.0000 mL | Freq: Two times a day (BID) | ORAL | Status: DC | PRN
  Administered 2017-06-19: 5 mL via ORAL
  Filled 2017-06-19: qty 5

## 2017-06-19 MED ORDER — SODIUM CHLORIDE 0.9 % IV SOLN
Freq: Once | INTRAVENOUS | Status: AC
  Administered 2017-06-19: 18:00:00 via INTRAVENOUS

## 2017-06-19 NOTE — Evaluation (Signed)
Physical Therapy Evaluation Patient Details Name: Chad Sparks. MRN: [id number] DOB: 11-07-1950 Today's Date: 06/19/2017   History of Present Illness  Patient is a 67 y.o.male,w Copd,NSCLCdx 03/29/2017, Hep C, Etoh abuse, Anemiapresenting with several episodes of syncope associated with cough.  Clinical Impression  Patient evaluated by Physical Therapy with no further acute PT needs identified. All education has been completed and the patient has no further questions. Pt continuing to cough and c/o chest pain while he is coughing. Pt able to safely transfer from surface to surface which is his baseline level of function. Ready for return home from a mobility standpoint.  See below for any follow-up Physical Therapy or equipment needs. PT is signing off. Thank you for this referral.     Follow Up Recommendations No PT follow up    Equipment Recommendations  None recommended by PT    Recommendations for Other Services       Precautions / Restrictions Precautions Precautions: Fall Restrictions Weight Bearing Restrictions: No      Mobility  Bed Mobility Overal bed mobility: Modified Independent             General bed mobility comments: pt able to put bed in proper position and get to EOB without physical assist. Increased effort to get LE's back into bed but able to do so with increased time  Transfers Overall transfer level: Needs assistance Equipment used: None Transfers: Lateral/Scoot Transfers          Lateral/Scoot Transfers: Supervision General transfer comment: pt able to scoot from bed to recliner and then back to bed with supervision. Safe hand placements, no concerns with his transfer method   Ambulation/Gait             General Gait Details: pt does not ambulate  Stairs            Wheelchair Mobility    Modified Rankin (Stroke Patients Only)       Balance Overall balance assessment: No apparent balance deficits (not formally  assessed)                                           Pertinent Vitals/Pain Pain Assessment: Faces Faces Pain Scale: Hurts even more Pain Location: chest only when coughing Pain Descriptors / Indicators: Aching Pain Intervention(s): Limited activity within patient's tolerance;Monitored during session    Home Living Family/patient expects to be discharged to:: Private residence Living Arrangements: Other relatives Available Help at Discharge: Personal care attendant;Other (Comment) Type of Home: House Home Access: Level entry     Home Layout: Two level Home Equipment: Wheelchair - [employer] - 4 wheels;Hospital bed Additional Comments: pt lives with his sister and has a personal care attendant that comes 3 hrs/ day 5 days/ wk. He does not walk but transfers to and from w/c    Prior Function Level of Independence: Needs assistance   Gait / Transfers Assistance Needed: does not walk, reports that he transfers with supervision  ADL's / Homemaking Assistance Needed: assistant helps him bathe and dress        Hand Dominance        Extremity/Trunk Assessment   Upper Extremity Assessment Upper Extremity Assessment: Generalized weakness    Lower Extremity Assessment Lower Extremity Assessment: Generalized weakness    Cervical / Trunk Assessment Cervical / Trunk Assessment: Kyphotic  Communication   Communication: No difficulties  Cognition Arousal/Alertness: Awake/alert Behavior During Therapy: WFL for tasks assessed/performed Overall Cognitive Status: Within Functional Limits for tasks assessed                                        General Comments General comments (skin integrity, edema, etc.): discussed LE activity in bed and positioning    Exercises     Assessment/Plan    PT Assessment Patent does not need any further PT services  PT Problem List         PT Treatment Interventions      PT Goals (Current goals can be  found in the Care Plan section)  Acute Rehab PT Goals Patient Stated Goal: return home PT Goal Formulation: All assessment and education complete, DC therapy    Frequency     Barriers to discharge        Co-evaluation               AM-PAC PT "6 Clicks" Daily Activity  Outcome Measure Difficulty turning over in bed (including adjusting bedclothes, sheets and blankets)?: A Little Difficulty moving from lying on back to sitting on the side of the bed? : A Little Difficulty sitting down on and standing up from a chair with arms (e.g., wheelchair, bedside commode, etc,.)?: Unable Help needed moving to and from a bed to chair (including a wheelchair)?: A Little Help needed walking in hospital room?: Total Help needed climbing 3-5 steps with a railing? : Total 6 Click Score: 12    End of Session   Activity Tolerance: Patient tolerated treatment well Patient left: in bed;with call bell/phone within reach Nurse Communication: Mobility status PT Visit Diagnosis: Muscle weakness (generalized) (M62.81)    Time: 8588-5027 PT Time Calculation (min) (ACUTE ONLY): 19 min   Charges:   PT Evaluation $PT Eval Moderate Complexity: 1 Mod     PT G Codes:        [place]  [place] 06/19/2017, 1:18 PM

## 2017-06-19 NOTE — Progress Notes (Signed)
PROGRESS NOTE    Chad Sparks.  ZOX:[id number] DOB: 1951-02-25 DOA: 06/16/2017 PCP: [facility]   Brief Narrative: Patient is a 67 y.o.male,w Copd, NSCLC dx 03/29/2017, Hep C, Etoh abuse, Anemia presenting with several episodes of syncope associated with cough. Patient was also found to have neutropenia on presentation.    Assessment & Plan:   Active Problems:   Hepatitis C   COPD (chronic obstructive pulmonary disease) (HCC)   Anemia   Syncope   Pleural effusion   Protein-calorie malnutrition, severe   Pressure injury of skin   Syncope:  Occurs after cough, suspect cough related syncope.  He noted that  this has been occurring for a few months.   Will hold of on brain imaging for now as no focal neuro symptoms seen. Tele, sinus.  EKG with NSR. Trop I q6h x3 (negative) Check orthostatic vitals. CT chest without evidence of PE We will start gentle IV fluids.Noted to be hypotensive. Echocardiogram shows normal left ventricle size and function, grade 1 diastolic dysfunction. Carotid Doppler did not show any significant carotid artery stenosis.  Weakness:Requested for physical therapy evaluation.  Patient is wheelchair-bound.  No specific recommendation from PT.  Stage IV NSCLC: follows with Dr. Julien Nordmann.  Receiving palliative chemo with carboplatin, alimta, and keytruda with first dose on 06/02/17.  Had port placed on 4/17.   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  Patient underwent thoracocentesis on 06/18/17 with removal of 1.7 L of bloody fluid.  No organisms seen on Gram stain.  Culture negative till date. Patient has chronic  right lower lobe airspace opacity due to lung cancer.   Anemia: s/p 1 unit pRBC.  Will transfuse him again today.We will check CBC tomorrow.We will check FOBT also.H/H has dropped since 4/10.    Thrombocytopenia:  Likely  2/2 chemo.    We will continue to follow up.  Neutropenia: Likely 2/2 chemo.  Given a dose of granix on 06/17/17,also discussed with Dr. Julien Nordmann.Afebrile now  Hyponatremia: Resolved  COPD Started on bronchodilators. Start Anoro 1puff qday Albuterol HFA 2puff q6h prn  Gerd Cont PPI  BPH Not on flomax  Vitamin D def Cont Vitamin D  Severe Protein calorie malnutrition Nutrition following.  Pressure Injury: Wound care following  Hypomagnesemia: Supplemented.      DVT prophylaxis: SCD Code Status: Full Family Communication: None present at the bedside Disposition Plan: Likely home with home health tomorrow.   Consultants: None  Procedures: None  Antimicrobials: None  Subjective: Patient seen and examined the bedside this morning.  Continues to remain weak.  Denies any specific complaints.   Objective: Vitals:   06/18/17 2049 06/19/17 0258 06/19/17 0535 06/19/17 1242  BP: (!) 81/66 (!) 84/61 96/66   Pulse: 84 89 99   Resp: 12  20   Temp: 98 F (36.7 C)  99.7 F (37.6 C)   TempSrc: Oral  Axillary   SpO2: 100%  97% 97%  Weight:   60.1 kg (132 lb 6.2 oz)   Height:        Intake/Output Summary (Last 24 hours) at 06/19/2017 1444 Last data filed at 06/18/2017 2100 Gross per 24 hour  Intake 360 ml  Output 125 ml  Net 235 ml   Filed Weights   06/17/17 0007 06/17/17 0514 06/19/17 0535  Weight: 58.6 kg (129 lb 3 oz) 59.9 kg (132 lb 1.6 oz) 60.1 kg (132 lb  6.2 oz)    Examination:  General exam: Appears calm and comfortable ,Not in distress, malnourished, cachectic  HEENT:PERRL,Oral mucosa moist, Ear/Nose normal on gross exam Respiratory system: Decreased air entry on the right side. Cardiovascular system: S1 & S2 heard, RRR. No JVD, murmurs, rubs, gallops or clicks. No pedal edema.  Chemo-Port on the left chest Gastrointestinal system: Abdomen is nondistended, soft and nontender. No organomegaly or masses felt. Normal bowel sounds heard. Central  nervous system: Alert and oriented. No focal neurological deficits.  Generalized weakness Extremities: No edema, no clubbing ,no cyanosis, distal peripheral pulses palpable. Skin: Full-thickness wound on the medial left buttock MSK: Normal muscle bulk,tone ,power Psychiatry: Judgement and insight appear normal. Mood & affect appropriate.     Data Reviewed: I have personally reviewed following labs and imaging studies  CBC: Recent Labs  Lab 06/16/17 0721 06/16/17 1858 06/17/17 0506 06/18/17 0448 06/19/17 0429  WBC 1.9* 1.4* 1.7* 1.4* 3.1*  NEUTROABS  --  0.4* 0.2* 0.2* 0.8*  HGB 7.9* 6.3* 7.8* 8.8* 7.9*  HCT 23.0* 17.9* 22.2* 24.9* 22.7*  MCV 86.5 86.9 86.4 86.8 87.6  PLT 19* 19* 23* 51* 72*   Basic Metabolic Panel: Recent Labs  Lab 06/16/17 1858 06/17/17 0506 06/18/17 0448 06/19/17 0429  NA 130* 130* 130* 135  K 3.5 3.5 3.6 3.9  CL 103 102 103 109  CO2 20* 22 20* 22  GLUCOSE 105* 99 88 111*  BUN 15 15 14 16   CREATININE 1.05 0.90 1.06 0.98  CALCIUM 8.2* 7.9* 7.8* 7.7*  MG  --   --  1.4* 1.8   GFR: Estimated Creatinine Clearance: 61.3 mL/min (by C-G formula based on SCr of 0.98 mg/dL). Liver Function Tests: Recent Labs  Lab 06/16/17 1858 06/17/17 0506 06/18/17 0448  AST 38 31 33  ALT 24 20 19   ALKPHOS 84 70 71  BILITOT 0.9 1.8* 1.9*  PROT 6.3* 5.5* 5.5*  ALBUMIN 1.9* 1.6* 1.7*   No results for input(s): LIPASE, AMYLASE in the last 168 hours. Recent Labs  Lab 06/16/17 1958  AMMONIA 12   Coagulation Profile: Recent Labs  Lab 06/16/17 0721 06/18/17 0448  INR 1.60 1.75   Cardiac Enzymes: Recent Labs  Lab 06/17/17 0006 06/17/17 0506 06/17/17 1212  TROPONINI <0.03 <0.03 <0.03   BNP (last 3 results) No results for input(s): PROBNP in the last 8760 hours. HbA1C: No results for input(s): HGBA1C in the last 72 hours. CBG: No results for input(s): GLUCAP in the last 168 hours. Lipid Profile: No results for input(s): CHOL, HDL, LDLCALC, TRIG,  CHOLHDL, LDLDIRECT in the last 72 hours. Thyroid Function Tests: No results for input(s): TSH, T4TOTAL, FREET4, T3FREE, THYROIDAB in the last 72 hours. Anemia Panel: No results for input(s): VITAMINB12, FOLATE, FERRITIN, TIBC, IRON, RETICCTPCT in the last 72 hours. Sepsis Labs: No results for input(s): PROCALCITON, LATICACIDVEN in the last 168 hours.  Recent Results (from the past 240 hour(s))  Body fluid culture     Status: None (Preliminary result)   Collection Time: 06/18/17  3:46 PM  Result Value Ref Range Status   Specimen Description   Final    PLEURAL RIGHT Performed at [facility] [address] [place], [place] [address]    Special Requests   Final    NONE Performed at [facility], [place] [address] [place], [place] [address]    Gram Stain   Final    FEW WBC PRESENT, PREDOMINANTLY MONONUCLEAR NO ORGANISMS SEEN  Culture   Final    NO GROWTH < 24 HOURS Performed at [facility], [place] [address], [place], [place] [address]    Report Status PENDING  Incomplete         Radiology Studies: Dg Chest 1 View  Result Date: 06/18/2017 CLINICAL DATA:  Status post right thoracentesis. EXAM: CHEST  1 VIEW COMPARISON:  06/17/2017 FINDINGS: Considerable reduction in right-sided pleural effusion is noted although some fluid remains. Improved aeration of the right lung is noted with some persistent consolidation inferiorly. No pneumothorax is noted. Right chest wall port is noted in satisfactory position. Left lung is clear. IMPRESSION: No pneumothorax following thoracentesis on the right. Electronically Signed   By: Inez Catalina M.D.   On: 06/18/2017 16:17   US Thoracentesis Asp Pleural Space W/img Guide  Result Date: 06/18/2017 INDICATION: Patient with history of stage IV non-small cell lung cancer, hepatitis-C, anemia, thrombocytopenia, dyspnea, cough, right pleural effusion. Request made for diagnostic and therapeutic right  thoracentesis. EXAM: ULTRASOUND GUIDED DIAGNOSTIC AND THERAPEUTIC RIGHT THORACENTESIS MEDICATIONS: None COMPLICATIONS: None immediate. PROCEDURE: An ultrasound guided thoracentesis was thoroughly discussed with the patient and questions answered. The benefits, risks, alternatives and complications were also discussed. The patient understands and wishes to proceed with the procedure. Written consent was obtained. Ultrasound was performed to localize and mark an adequate pocket of fluid in the right chest. The area was then prepped and draped in the normal sterile fashion. 1% Lidocaine was used for local anesthesia. Under ultrasound guidance a 6 Fr Safe-T-Centesis catheter was introduced. Thoracentesis was performed. The catheter was removed and a dressing applied. FINDINGS: A total of approximately 1.7 liters of bloody fluid was removed. Samples were sent to the laboratory as requested by the clinical team. Due to patient coughing/chest discomfort/initial thoracentesis/hypotension only the above amount of fluid was removed today. IMPRESSION: Successful ultrasound guided diagnostic and therapeutic right thoracentesis yielding 1.7 liters of pleural fluid. Follow-up chest x-ray revealed no pneumothorax. Read by: Rowe Robert, PA-C Electronically Signed   By: Lucrezia Europe M.D.   On: 06/18/2017 16:17        Scheduled Meds: . feeding supplement (ENSURE ENLIVE)  237 mL Oral BID BM  . folic acid  1 mg Oral Daily  . pantoprazole  40 mg Oral Daily  . umeclidinium-vilanterol  1 puff Inhalation Daily  . Vitamin D (Ergocalciferol)  50,000 Units Oral Q7 days   Continuous Infusions: . sodium chloride 100 mL/hr at 06/19/17 1036     LOS: 3 days    Time spent: 25 mins.Shelly Coss, MD [facility] Pager [phone]  If 7PM-7AM, please contact night-coverage [email or web address] Password [facility] 06/19/2017, 2:44 PM

## 2017-06-20 DIAGNOSIS — J449 Chronic obstructive pulmonary disease, unspecified: Secondary | ICD-10-CM

## 2017-06-20 LAB — CBC WITH DIFFERENTIAL/PLATELET
Band Neutrophils: 4 %
Basophils Absolute: 0 10*3/uL (ref 0.0–0.1)
Basophils Relative: 0 %
Eosinophils Absolute: 0.2 10*3/uL (ref 0.0–0.7)
Eosinophils Relative: 4 %
HCT: 29.9 % — ABNORMAL LOW (ref 39.0–52.0)
Hemoglobin: 10.4 g/dL — ABNORMAL LOW (ref 13.0–17.0)
LYMPHS ABS: 0.6 10*3/uL — AB (ref 0.7–4.0)
Lymphocytes Relative: 10 %
MCH: 29.1 pg (ref 26.0–34.0)
MCHC: 34.8 g/dL (ref 30.0–36.0)
MCV: 83.8 fL (ref 78.0–100.0)
MONO ABS: 2.4 10*3/uL — AB (ref 0.1–1.0)
Monocytes Relative: 40 %
NEUTROS ABS: 2.8 10*3/uL (ref 1.7–7.7)
NRBC: 1 /100{WBCs} — AB
Neutrophils Relative %: 42 %
PLATELETS: 96 10*3/uL — AB (ref 150–400)
RBC: 3.57 MIL/uL — AB (ref 4.22–5.81)
RDW: 18.1 % — AB (ref 11.5–15.5)
WBC: 6 10*3/uL (ref 4.0–10.5)

## 2017-06-20 MED ORDER — POLYETHYLENE GLYCOL 3350 17 G PO PACK
17.0000 g | PACK | Freq: Every day | ORAL | Status: DC
  Administered 2017-06-20: 17 g via ORAL
  Filled 2017-06-20: qty 1

## 2017-06-20 MED ORDER — PANTOPRAZOLE SODIUM 40 MG PO TBEC
40.0000 mg | DELAYED_RELEASE_TABLET | Freq: Two times a day (BID) | ORAL | Status: DC
  Administered 2017-06-20 – 2017-06-21 (×3): 40 mg via ORAL
  Filled 2017-06-20 (×3): qty 1

## 2017-06-20 NOTE — Progress Notes (Signed)
Patient had an incontinent large, brown BM with areas of black tarry also.

## 2017-06-20 NOTE — Progress Notes (Signed)
PROGRESS NOTE    Chad Sparks.  QMG:[id number] DOB: 11-Aug-1950 DOA: 06/16/2017 PCP: [facility]   Brief Narrative: Patient is a 67 y.o.male,w Copd, NSCLC dx 03/29/2017, Hep C, Etoh abuse, Anemia presenting with several episodes of syncope associated with cough. Patient was also found to have neutropenia on presentation.  Patient had large right-sided pleural effusion and underwent thoracentesis.  He Hb was found to be dropping, FOBT chemo to be negative.  GI consulted.  Waiting for evaluation.    Assessment & Plan:   Active Problems:   Hepatitis C   COPD (chronic obstructive pulmonary disease) (HCC)   Anemia   Syncope   Pleural effusion   Protein-calorie malnutrition, severe   Pressure injury of skin   Syncope/Weakness:  Occurs after cough, suspected cough related syncope.  He noted that  this has been occurring for a few months. Likely surgery with his lung cancer.  Trop I q6h x3 (negative) CT chest without evidence of PE On  gentle IV fluids.BP soft. Echocardiogram shows normal left ventricle size and function, grade 1 diastolic dysfunction. Carotid Doppler did not show any significant carotid artery stenosis. Patient evaluated by PT ,no specific recommendation.  Patient is wheelchair-bound.  Stage IV NSCLC: Follows with Dr. Julien Nordmann.  Receiving palliative chemo with carboplatin, alimta, and keytruda with first dose on 06/02/17.  Had port placed on 4/17.   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  Patient underwent thoracocentesis on 06/18/17 with removal of 1.7 L of bloody fluid.  No organisms seen on Gram stain.  Culture negative till date. Patient has chronic  right lower lobe airspace opacity due to lung cancer.   Anemia: s/p 2 unit pRBC in total. FOBT positive.H/H has dropped since 4/10.  Patient doesnot have any bloody bowel movement and denies  any change in the color of the stool. GI consulted.  Discussed with Dr.Magod today. Patient will be evaluated either today or tomorrow.  Thrombocytopenia:  Likely 2/2 chemo. We will continue to follow up.  Neutropenia: Likely 2/2 chemo.  Given a dose of granix on 06/17/17,also discussed with Dr. Julien Nordmann.improvement in white cell counts .Afebrile  Hyponatremia: Resolved  COPD: Not on acute exacerbation Started on bronchodilators. Started on Anoro 1puff qday Albuterol HFA 2puff q6h prn  Gerd Cont PPI  Vitamin D def Cont Vitamin D  Severe Protein calorie malnutrition Nutrition following.  Pressure Injury: Wound care following  Hypomagnesemia: Supplemented.      DVT prophylaxis: SCD Code Status: Full Family Communication: None present at the bedside Disposition Plan: Likely home with home health after GI evaluation   Consultants: None  Procedures: None  Antimicrobials: None  Subjective: Patient seen and examined the bedside this morning.  Continues to remain weak.  Denies any specific complaints.   Objective: Vitals:   06/19/17 1852 06/19/17 2247 06/20/17 0615 06/20/17 0716  BP: 107/75 101/69 98/67   Pulse: 95 86 82   Resp: 16 18 20    Temp: 98 F (36.7 C) 98.3 F (36.8 C) 97.6 F (36.4 C)   TempSrc: Oral Oral Oral   SpO2: 96% 99% 99%   Weight:    60.1 kg (132 lb 7.9 oz)  Height:        Intake/Output Summary (Last 24 hours) at 06/20/2017 1413 Last data filed at 06/20/2017 0848 Gross per 24 hour  Intake 3461.33 ml  Output 400 ml  Net 3061.33 ml  Filed Weights   06/17/17 0514 06/19/17 0535 06/20/17 0716  Weight: 59.9 kg (132 lb 1.6 oz) 60.1 kg (132 lb 6.2 oz) 60.1 kg (132 lb 7.9 oz)    Examination:  General exam: Weak ,Not in distress,frial, cachectic HEENT:PERRL,Oral mucosa moist, Ear/Nose normal on gross exam Respiratory system: Decreased air entry in the right side Cardiovascular system: S1 & S2 heard, RRR. No JVD, murmurs, rubs, gallops or  clicks, Chemo-Port Gastrointestinal system: Abdomen is nondistended, soft and nontender. No organomegaly or masses felt. Normal bowel sounds heard. Central nervous system: Alert and oriented. No focal neurological deficits. Extremities: No edema, no clubbing ,no cyanosis, distal peripheral pulses palpable. Skin: No rashes, lesions or ulcers,no icterus ,no pallor,Full-thickness wound on the medial left buttock   Data Reviewed: I have personally reviewed following labs and imaging studies  CBC: Recent Labs  Lab 06/16/17 1858 06/17/17 0506 06/18/17 0448 06/19/17 0429 06/20/17 0331  WBC 1.4* 1.7* 1.4* 3.1* 6.0  NEUTROABS 0.4* 0.2* 0.2* 0.8* 2.8  HGB 6.3* 7.8* 8.8* 7.9* 10.4*  HCT 17.9* 22.2* 24.9* 22.7* 29.9*  MCV 86.9 86.4 86.8 87.6 83.8  PLT 19* 23* 51* 72* 96*   Basic Metabolic Panel: Recent Labs  Lab 06/16/17 1858 06/17/17 0506 06/18/17 0448 06/19/17 0429  NA 130* 130* 130* 135  K 3.5 3.5 3.6 3.9  CL 103 102 103 109  CO2 20* 22 20* 22  GLUCOSE 105* 99 88 111*  BUN 15 15 14 16   CREATININE 1.05 0.90 1.06 0.98  CALCIUM 8.2* 7.9* 7.8* 7.7*  MG  --   --  1.4* 1.8   GFR: Estimated Creatinine Clearance: 61.3 mL/min (by C-G formula based on SCr of 0.98 mg/dL). Liver Function Tests: Recent Labs  Lab 06/16/17 1858 06/17/17 0506 06/18/17 0448  AST 38 31 33  ALT 24 20 19   ALKPHOS 84 70 71  BILITOT 0.9 1.8* 1.9*  PROT 6.3* 5.5* 5.5*  ALBUMIN 1.9* 1.6* 1.7*   No results for input(s): LIPASE, AMYLASE in the last 168 hours. Recent Labs  Lab 06/16/17 1958  AMMONIA 12   Coagulation Profile: Recent Labs  Lab 06/16/17 0721 06/18/17 0448  INR 1.60 1.75   Cardiac Enzymes: Recent Labs  Lab 06/17/17 0006 06/17/17 0506 06/17/17 1212  TROPONINI <0.03 <0.03 <0.03   BNP (last 3 results) No results for input(s): PROBNP in the last 8760 hours. HbA1C: No results for input(s): HGBA1C in the last 72 hours. CBG: No results for input(s): GLUCAP in the last 168  hours. Lipid Profile: No results for input(s): CHOL, HDL, LDLCALC, TRIG, CHOLHDL, LDLDIRECT in the last 72 hours. Thyroid Function Tests: No results for input(s): TSH, T4TOTAL, FREET4, T3FREE, THYROIDAB in the last 72 hours. Anemia Panel: No results for input(s): VITAMINB12, FOLATE, FERRITIN, TIBC, IRON, RETICCTPCT in the last 72 hours. Sepsis Labs: No results for input(s): PROCALCITON, LATICACIDVEN in the last 168 hours.  Recent Results (from the past 240 hour(s))  Body fluid culture     Status: None (Preliminary result)   Collection Time: 06/18/17  3:46 PM  Result Value Ref Range Status   Specimen Description   Final    PLEURAL RIGHT Performed at [facility] [address] [place], [place] [address]    Special Requests   Final    NONE Performed at [facility], [place] [address] [place], [place] [address]    Gram Stain   Final    FEW WBC PRESENT, PREDOMINANTLY MONONUCLEAR NO ORGANISMS SEEN  Culture   Final    NO GROWTH 2 DAYS Performed at [facility], [place] [address], [place], [place] [address]    Report Status PENDING  Incomplete         Radiology Studies: Dg Chest 1 View  Result Date: 06/18/2017 CLINICAL DATA:  Status post right thoracentesis. EXAM: CHEST  1 VIEW COMPARISON:  06/17/2017 FINDINGS: Considerable reduction in right-sided pleural effusion is noted although some fluid remains. Improved aeration of the right lung is noted with some persistent consolidation inferiorly. No pneumothorax is noted. Right chest wall port is noted in satisfactory position. Left lung is clear. IMPRESSION: No pneumothorax following thoracentesis on the right. Electronically Signed   By: Inez Catalina M.D.   On: 06/18/2017 16:17   US Thoracentesis Asp Pleural Space W/img Guide  Result Date: 06/18/2017 INDICATION: Patient with history of stage IV non-small cell lung cancer, hepatitis-C, anemia, thrombocytopenia, dyspnea, cough, right pleural  effusion. Request made for diagnostic and therapeutic right thoracentesis. EXAM: ULTRASOUND GUIDED DIAGNOSTIC AND THERAPEUTIC RIGHT THORACENTESIS MEDICATIONS: None COMPLICATIONS: None immediate. PROCEDURE: An ultrasound guided thoracentesis was thoroughly discussed with the patient and questions answered. The benefits, risks, alternatives and complications were also discussed. The patient understands and wishes to proceed with the procedure. Written consent was obtained. Ultrasound was performed to localize and mark an adequate pocket of fluid in the right chest. The area was then prepped and draped in the normal sterile fashion. 1% Lidocaine was used for local anesthesia. Under ultrasound guidance a 6 Fr Safe-T-Centesis catheter was introduced. Thoracentesis was performed. The catheter was removed and a dressing applied. FINDINGS: A total of approximately 1.7 liters of bloody fluid was removed. Samples were sent to the laboratory as requested by the clinical team. Due to patient coughing/chest discomfort/initial thoracentesis/hypotension only the above amount of fluid was removed today. IMPRESSION: Successful ultrasound guided diagnostic and therapeutic right thoracentesis yielding 1.7 liters of pleural fluid. Follow-up chest x-ray revealed no pneumothorax. Read by: Rowe Robert, PA-C Electronically Signed   By: Lucrezia Europe M.D.   On: 06/18/2017 16:17        Scheduled Meds: . feeding supplement (ENSURE ENLIVE)  237 mL Oral BID BM  . folic acid  1 mg Oral Daily  . pantoprazole  40 mg Oral BID  . polyethylene glycol  17 g Oral Daily  . umeclidinium-vilanterol  1 puff Inhalation Daily  . Vitamin D (Ergocalciferol)  50,000 Units Oral Q7 days   Continuous Infusions: . sodium chloride 100 mL/hr at 06/20/17 1049     LOS: 4 days    Time spent: 25 mins.      Shelly Coss, MD [facility] Pager [phone]  If 7PM-7AM, please contact night-coverage [email or web address] Password  [facility] 06/20/2017, 2:13 PM

## 2017-06-21 ENCOUNTER — Encounter (HOSPITAL_COMMUNITY)
Admission: EM | Disposition: A | Discharge status: Home / Self Care | Payer: Self-pay | Source: Home / Self Care | Attending: Internal Medicine

## 2017-06-21 ENCOUNTER — Encounter (HOSPITAL_COMMUNITY): Payer: Self-pay

## 2017-06-21 ENCOUNTER — Telehealth: Payer: Self-pay | Admitting: Medical Oncology

## 2017-06-21 ENCOUNTER — Inpatient Hospital Stay (HOSPITAL_COMMUNITY): Payer: Medicare Other | Admitting: Anesthesiology

## 2017-06-21 DIAGNOSIS — B182 Chronic viral hepatitis C: Secondary | ICD-10-CM

## 2017-06-21 HISTORY — PX: ESOPHAGOGASTRODUODENOSCOPY (EGD) WITH PROPOFOL: SHX5813

## 2017-06-21 LAB — TYPE AND SCREEN
ABO/RH(D): O POS
Antibody Screen: NEGATIVE
Unit division: 0
Unit division: 0

## 2017-06-21 LAB — BASIC METABOLIC PANEL
ANION GAP: 4 — AB (ref 5–15)
BUN: 13 mg/dL (ref 6–20)
CALCIUM: 7.8 mg/dL — AB (ref 8.9–10.3)
CO2: 22 mmol/L (ref 22–32)
CREATININE: 0.83 mg/dL (ref 0.61–1.24)
Chloride: 110 mmol/L (ref 101–111)
GFR calc Af Amer: >60 mL/min (ref >60)
Glucose, Bld: 86 mg/dL (ref 65–99)
Potassium: 3.8 mmol/L (ref 3.5–5.1)
SODIUM: 136 mmol/L (ref 135–145)

## 2017-06-21 LAB — CBC WITH DIFFERENTIAL/PLATELET
BASOS ABS: 0 10*3/uL (ref 0.0–0.1)
Basophils Relative: 0 %
EOS PCT: 1 %
Eosinophils Absolute: 0.1 10*3/uL (ref 0.0–0.7)
HCT: 29.9 % — ABNORMAL LOW (ref 39.0–52.0)
Hemoglobin: 10.3 g/dL — ABNORMAL LOW (ref 13.0–17.0)
Lymphocytes Relative: 16 %
Lymphs Abs: 1.5 10*3/uL (ref 0.7–4.0)
MCH: 28.9 pg (ref 26.0–34.0)
MCHC: 34.4 g/dL (ref 30.0–36.0)
MCV: 84 fL (ref 78.0–100.0)
MONO ABS: 4.6 10*3/uL — AB (ref 0.1–1.0)
Monocytes Relative: 50 %
NEUTROS PCT: 33 %
Neutro Abs: 3 10*3/uL (ref 1.7–7.7)
PLATELETS: 92 10*3/uL — AB (ref 150–400)
RBC: 3.56 MIL/uL — AB (ref 4.22–5.81)
RDW: 18.2 % — AB (ref 11.5–15.5)
WBC: 9.2 10*3/uL (ref 4.0–10.5)

## 2017-06-21 LAB — BPAM RBC
Blood Product Expiration Date: 201905152359
Blood Product Expiration Date: 201905152359
ISSUE DATE / TIME: 201904180016
ISSUE DATE / TIME: 201904201831
Unit Type and Rh: 5100
Unit Type and Rh: 5100

## 2017-06-21 LAB — PATHOLOGIST SMEAR REVIEW

## 2017-06-21 SURGERY — ESOPHAGOGASTRODUODENOSCOPY (EGD) WITH PROPOFOL
Anesthesia: Monitor Anesthesia Care

## 2017-06-21 MED ORDER — PHENYLEPHRINE HCL 10 MG/ML IJ SOLN
INTRAMUSCULAR | Status: DC | PRN
  Administered 2017-06-21 (×3): 120 ug via INTRAVENOUS

## 2017-06-21 MED ORDER — PROPOFOL 500 MG/50ML IV EMUL
INTRAVENOUS | Status: DC | PRN
  Administered 2017-06-21 (×2): 175 ug/kg/min via INTRAVENOUS

## 2017-06-21 MED ORDER — GLYCOPYRROLATE 0.2 MG/ML IJ SOLN
INTRAMUSCULAR | Status: DC | PRN
  Administered 2017-06-21: 0.1 mg via INTRAVENOUS

## 2017-06-21 MED ORDER — SODIUM CHLORIDE 0.9 % IV SOLN: INTRAVENOUS | Status: DC

## 2017-06-21 MED ORDER — PROPOFOL 10 MG/ML IV BOLUS
INTRAVENOUS | Status: AC
  Filled 2017-06-21: qty 60

## 2017-06-21 MED ORDER — LIDOCAINE HCL (CARDIAC) PF 100 MG/5ML IV SOSY
PREFILLED_SYRINGE | INTRAVENOUS | Status: DC | PRN
  Administered 2017-06-21: 50 mg via INTRAVENOUS

## 2017-06-21 SURGICAL SUPPLY — 15 items

## 2017-06-21 NOTE — Anesthesia Postprocedure Evaluation (Signed)
Anesthesia Post Note  Patient: Chad Sparks.  Procedure(s) Performed: ESOPHAGOGASTRODUODENOSCOPY (EGD) WITH PROPOFOL (N/A )     Patient location during evaluation: PACU Anesthesia Type: MAC Level of consciousness: awake and alert Pain management: pain level controlled Vital Signs Assessment: post-procedure vital signs reviewed and stable Respiratory status: spontaneous breathing Cardiovascular status: stable Anesthetic complications: no    Last Vitals:  Vitals:   06/21/17 1505 06/21/17 1510  BP: 99/68 96/68  Pulse: (!) 110 (!) 114  Resp: (!) 29 (!) 29  Temp:    SpO2: 100% 98%    Last Pain:  Vitals:   06/21/17 1510  TempSrc:   PainSc: 0-No pain                 Nolon Nations

## 2017-06-21 NOTE — Transfer of Care (Signed)
Immediate Anesthesia Transfer of Care Note  Patient: Chad Sparks.  Procedure(s) Performed: ESOPHAGOGASTRODUODENOSCOPY (EGD) WITH PROPOFOL (N/A )  Patient Location: PACU  Anesthesia Type:MAC  Level of Consciousness: awake, alert , oriented and patient cooperative  Airway & Oxygen Therapy: Patient Spontanous Breathing and Patient connected to nasal cannula oxygen  Post-op Assessment: Report given to RN, Post -op Vital signs reviewed and stable and Patient moving all extremities X 4  Post vital signs: stable  Last Vitals:  Vitals Value Taken Time  BP 84/56 06/21/2017  2:58 PM  Temp    Pulse 117 06/21/2017  3:00 PM  Resp 38 06/21/2017  3:00 PM  SpO2 100 % 06/21/2017  3:00 PM  Vitals shown include unvalidated device data.  Last Pain:  Vitals:   06/21/17 1453  TempSrc:   PainSc: 0-No pain      Patients Stated Pain Goal: 5 (12/25/83 2778)  Complications: No apparent anesthesia complications

## 2017-06-21 NOTE — Anesthesia Preprocedure Evaluation (Signed)
Anesthesia Evaluation  Patient identified by MRN, date of birth, ID band Patient awake    Reviewed: Allergy & Precautions, NPO status , Patient's Chart, lab work & pertinent test results  Airway Mallampati: II  TM Distance: >3 FB Neck ROM: Full    Dental  (+) Edentulous Upper, Poor Dentition, Dental Advisory Given   Pulmonary COPD, Current Smoker,    Pulmonary exam normal        Cardiovascular negative cardio ROS Normal cardiovascular exam Rhythm:Regular     Neuro/Psych PSYCHIATRIC DISORDERS negative neurological ROS     GI/Hepatic GERD  ,(+)     substance abuse  alcohol use, Hepatitis -, C  Endo/Other  negative endocrine ROS  Renal/GU Renal InsufficiencyRenal disease     Musculoskeletal   Abdominal   Peds  Hematology  (+) anemia ,   Anesthesia Other Findings   Reproductive/Obstetrics                             Anesthesia Physical  Anesthesia Plan  ASA: III  Anesthesia Plan: MAC   Post-op Pain Management:    Induction: Intravenous  PONV Risk Score and Plan:   Airway Management Planned: Simple Face Mask  Additional Equipment:   Intra-op Plan:   Post-operative Plan:   Informed Consent: I have reviewed the patients History and Physical, chart, labs and discussed the procedure including the risks, benefits and alternatives for the proposed anesthesia with the patient or authorized representative who has indicated his/her understanding and acceptance.   Dental advisory given  Plan Discussed with: CRNA, Anesthesiologist and Surgeon  Anesthesia Plan Comments:         Anesthesia Quick Evaluation

## 2017-06-21 NOTE — Telephone Encounter (Signed)
I left message that I will talk to [facility] about appts wed and told her pt will not get tx this week.

## 2017-06-21 NOTE — Op Note (Signed)
[facility] Patient Name: Chad Sparks Procedure Date: 06/21/2017 MRN: [id number] Attending MD: Clarene Essex , MD Date of Birth: 08/11/1950 CSN: [id number] Age: 66 Admit Type: Inpatient Procedure:                Upper GI endoscopy Indications:              Heme positive stool anemia Providers:                Clarene Essex, MD, Zenon Mayo, RN, Tinnie Gens,                            Technician, Enrigue Catena, CRNA Referring MD:              Medicines:                Propofol total dose 120 mg IV, 50 mg IV lidocaine                            0.1 mg Robinul Complications:            No immediate complications. Estimated Blood Loss:     Estimated blood loss: none. Procedure:                Pre-Anesthesia Assessment:                           - Prior to the procedure, a History and Physical                            was performed, and patient medications and                            allergies were reviewed. The patient's tolerance of                            previous anesthesia was also reviewed. The risks                            and benefits of the procedure and the sedation                            options and risks were discussed with the patient.                            All questions were answered, and informed consent                            was obtained. Prior Anticoagulants: The patient has                            taken no previous anticoagulant or antiplatelet                            agents. ASA Grade Assessment: III - A patient with  severe systemic disease. After reviewing the risks                            and benefits, the patient was deemed in                            satisfactory condition to undergo the procedure.                           After obtaining informed consent, the endoscope was                            passed under direct vision. Throughout the                            procedure, the  patient's blood pressure, pulse, and                            oxygen saturations were monitored continuously. The                            Endoscope was introduced through the mouth, and                            advanced to the third part of duodenum. The upper                            GI endoscopy was accomplished without difficulty.                            The patient tolerated the procedure well. Scope In: Scope Out: Findings:      The larynx was normal.      A small hiatal hernia was present.      Localized mild inflammation characterized by congestion (edema) and       edematous folds was found in the gastric antrum.      The duodenal bulb, first portion of the duodenum, second portion of the       duodenum and third portion of the duodenum were normal.      The exam was otherwise without abnormality. Impression:               - Normal larynx.                           - Small hiatal hernia.                           - Antral Gastritis.                           - Normal duodenal bulb, first portion of the                            duodenum, second portion of the duodenum and third  portion of the duodenum.                           - The examination was otherwise normal.                           - No specimens collected. Moderate Sedation:      N/A- Per Anesthesia Care Recommendation:           - Patient has a contact number available for                            emergencies. The signs and symptoms of potential                            delayed complications were discussed with the                            patient. Return to normal activities tomorrow.                            Written discharge instructions were provided to the                            patient.                           - Soft diet today. No further GI workup currently                            with normal PET scan in January doubt significant                             worrisome lesion but happy to continue workup if                            signs of GI blood loss continue                           - Continue present medications.                           - Return to GI clinic PRN.                           - Telephone GI clinic if symptomatic PRN. Procedure Code(s):        --- Professional ---                           [phone], Esophagogastroduodenoscopy, flexible,                            transoral; diagnostic, including collection of                            specimen(s) by brushing or washing, when performed                            (  separate procedure) Diagnosis Code(s):        --- Professional ---                           K44.9, Diaphragmatic hernia without obstruction or                            gangrene                           K29.70, Gastritis, unspecified, without bleeding                           R19.5, Other fecal abnormalities CPT copyright 2017 American Medical Association. All rights reserved. The codes documented in this report are preliminary and upon coder review may  be revised to meet current compliance requirements. Clarene Essex, MD 06/21/2017 2:51:41 PM This report has been signed electronically. Number of Addenda: 0

## 2017-06-21 NOTE — Consult Note (Addendum)
Referring Provider: [facility] Primary Care Physician:  [facility] Primary Gastroenterologist:  unassigned  Reason for Consultation: anemia, drop in hemoglobin  HPI: Chad Sparks. is a 67 y.o. male with past medical history of stage IV  non-small cell lung cancer diagnosed in January 2019 currently on palliative chemotherapy, history of COPD, history of hepatitis C, alcohol abuse admitted to the hospital for further evaluation of syncope. Patient was found to have a mild  drop in hemoglobin.FOBT positive. GI is consulted for further evaluation. Patient seen and examined at bedside. He denies any black tarry stool or bright blood per rectum but nursing staff has reported streaks of black tarry material in the brown color stool. He denies nausea vomiting. Denies abdominal pain. Complaining of trouble swallowing particularly with liquids. Denies pain while swallowing.  EGD in August 2016 for evaluation of coffee-ground emesis was normal.  Past Medical History:  Diagnosis Date  . Alcohol abuse   . Allergy   . Chronic kidney disease   . COPD (chronic obstructive pulmonary disease) ([place])   . Emphysema of lung ([place])   . Heavy cigarette smoker   . Hepatitis C   . Lung cancer [facility])     Past Surgical History:  Procedure Laterality Date  . ESOPHAGOGASTRODUODENOSCOPY (EGD) WITH PROPOFOL N/A 10/15/2014   Procedure: ESOPHAGOGASTRODUODENOSCOPY (EGD) WITH PROPOFOL;  Surgeon: Wonda Horner, MD;  Location: [facility] ENDOSCOPY;  Service: Endoscopy;  Laterality: N/A;  . IR FLUORO GUIDE PORT INSERTION RIGHT  06/16/2017  . IR US GUIDE VASC ACCESS RIGHT  06/16/2017    Prior to Admission medications   Medication Sig Start Date End Date Taking? Authorizing Provider  Calcium 200 MG TABS Take 200 mg by mouth daily.   Yes [provider]  dexamethasone (DECADRON) 4 MG tablet 4 mg po bid the day before, day off and day after chemo Patient taking differently: Take 4 mg by mouth See admin  instructions. Take 4 mg by mouth twice the day before, day off and day after chemo 04/27/17  Yes Curt Bears, MD  folic acid (FOLVITE) 1 MG tablet Take 1 tablet (1 mg total) by mouth daily. 04/27/17  Yes Curt Bears, MD  prochlorperazine (COMPAZINE) 10 MG tablet Take 1 tablet (10 mg total) by mouth every 6 (six) hours as needed for nausea or vomiting. 04/27/17  Yes Curt Bears, MD  Vitamin D, Ergocalciferol, (DRISDOL) 50000 units CAPS capsule Take 50,000 Units by mouth every 7 (seven) days.   Yes [provider]  albuterol (PROVENTIL HFA;VENTOLIN HFA) 108 (90 Base) MCG/ACT inhaler Inhale 2 puffs into the lungs every 6 (six) hours as needed for wheezing or shortness of breath. Patient not taking: Reported on 06/14/2017 04/25/15   Saguier, Percell [place], PA-C  fluticasone (FLOVENT HFA) 110 MCG/ACT inhaler Inhale 2 puffs into the lungs 2 (two) times daily. Patient not taking: Reported on 06/14/2017 04/25/15   Saguier, Percell [place], PA-C  gabapentin (NEURONTIN) 100 MG capsule Take 1 capsule (100 mg total) by mouth at bedtime. Patient not taking: Reported on 06/02/2017 02/06/15   Saguier, Percell [place], PA-C  ipratropium (ATROVENT HFA) 17 MCG/ACT inhaler Inhale 2 puffs into the lungs every 6 (six) hours as needed for wheezing. Patient not taking: Reported on 06/16/2017 01/31/15   Saguier, Percell [place], PA-C  lidocaine-prilocaine (EMLA) cream Apply 1 application topically as needed. 06/02/17   Maryanna Shape, NP  metoCLOPramide (REGLAN) 10 MG tablet Take 1 tablet (10 mg total) by mouth 3 (three) times daily before meals. Patient not taking:  Reported on 06/16/2017 02/20/15   Saguier, Percell [place], PA-C  omeprazole (PRILOSEC) 20 MG capsule Take 1 capsule (20 mg total) by mouth daily. Patient not taking: Reported on 06/16/2017 01/31/15   Saguier, Percell [place], PA-C  tamsulosin (FLOMAX) 0.4 MG CAPS capsule Take 1 capsule (0.4 mg total) by mouth daily. Patient not taking: Reported on 06/16/2017 02/20/15   Saguier, Percell [place], PA-C     Scheduled Meds: . feeding supplement (ENSURE ENLIVE)  237 mL Oral BID BM  . folic acid  1 mg Oral Daily  . pantoprazole  40 mg Oral BID  . polyethylene glycol  17 g Oral Daily  . umeclidinium-vilanterol  1 puff Inhalation Daily  . Vitamin D (Ergocalciferol)  50,000 Units Oral Q7 days   Continuous Infusions: . sodium chloride 75 mL/hr at 06/20/17 1427   PRN Meds:.acetaminophen **OR** acetaminophen, albuterol, chlorpheniramine-HYDROcodone, lip balm, prochlorperazine, sodium chloride flush, traMADol  Allergies as of 06/16/2017 - Review Complete 06/16/2017  Allergen Reaction Noted  . Other Shortness Of Breath and Other (See Comments) 08/21/2013    Family History  Problem Relation Age of Onset  . Throat cancer Mother   . Cancer Mother        throat  . Emphysema Father   . Brain cancer Brother   . Cancer Brother        brain  . Cancer Paternal Grandmother        leukemia    Social History   Socioeconomic History  . Marital status: Single    Spouse name: Not on file  . Number of children: Not on file  . Years of education: Not on file  . Highest education level: Not on file  Occupational History  . Not on file  Social Needs  . Financial resource strain: Not on file  . Food insecurity:    Worry: Not on file    Inability: Not on file  . Transportation needs:    Medical: Not on file    Non-medical: Not on file  Tobacco Use  . Smoking status: Current Every Day Smoker    Packs/day: 0.50    Years: 53.00    Pack years: 26.50    Types: Cigarettes  . Smokeless tobacco: Never Used  Substance and Sexual Activity  . Alcohol use: Yes    Comment:  2-4 beers a month.  . Drug use: No  . Sexual activity: Never  Lifestyle  . Physical activity:    Days per week: Not on file    Minutes per session: Not on file  . Stress: Not on file  Relationships  . Social connections:    Talks on phone: Not on file    Gets together: Not on file    Attends religious service: Not on  file    Active member of club or organization: Not on file    Attends meetings of clubs or organizations: Not on file    Relationship status: Not on file  . Intimate partner violence:    Fear of current or ex partner: Not on file    Emotionally abused: Not on file    Physically abused: Not on file    Forced sexual activity: Not on file  Other Topics Concern  . Not on file  Social History Narrative  . Not on file    Review of Systems: Review of Systems  Constitutional: Positive for fever, malaise/fatigue and weight loss. Negative for chills.  HENT: Negative for hearing loss and tinnitus.   Eyes: Negative for blurred  vision.  Respiratory: Positive for cough and sputum production.   Cardiovascular: Negative for chest pain and palpitations.  Gastrointestinal: Negative for abdominal pain, constipation, diarrhea, heartburn, nausea and vomiting.  Genitourinary: Negative for dysuria.  Musculoskeletal: Positive for back pain and myalgias.  Skin: Negative for rash.  Neurological: Positive for dizziness. Negative for sensory change.  Endo/Heme/Allergies: Does not bruise/bleed easily.  Psychiatric/Behavioral: Negative for hallucinations and suicidal ideas.    Physical Exam: Vital signs: Vitals:   06/20/17 2026 06/21/17 0505  BP: 117/82 100/74  Pulse: 89 87  Resp: 20 18  Temp: 98 F (36.7 C) 98.5 F (36.9 C)  SpO2: 99% 100%   Last BM Date: 06/20/17 Physical Exam  Constitutional: He is oriented to person, place, and time.  Frail-appearing patient. Not in active distress  HENT:  Head: Normocephalic and atraumatic.  Eyes: EOM are normal.  Neck: Normal range of motion. Neck supple.  Cardiovascular: Normal rate, regular rhythm and normal heart sounds.  Pulmonary/Chest: Effort normal.  Decreased breath sounds bilaterally. Anterior exam only.  Abdominal: Soft. Bowel sounds are normal. He exhibits no distension. There is tenderness. There is no rebound and no guarding.  Mild  epigastric discomfort on palpation.  Musculoskeletal: Normal range of motion. He exhibits no edema.  Neurological: He is alert and oriented to person, place, and time.  Skin: Skin is warm. No erythema.  Psychiatric: He has a normal mood and affect. Judgment normal.  Vitals reviewed.   GI:  Lab Results: Recent Labs    06/19/17 0429 06/20/17 0331 06/21/17 0354  WBC 3.1* 6.0 9.2  HGB 7.9* 10.4* 10.3*  HCT 22.7* 29.9* 29.9*  PLT 72* 96* 92*   BMET Recent Labs    06/19/17 0429 06/21/17 0354  NA 135 136  K 3.9 3.8  CL 109 110  CO2 22 22  GLUCOSE 111* 86  BUN 16 13  CREATININE 0.98 0.83  CALCIUM 7.7* 7.8*   LFT No results for input(s): PROT, ALBUMIN, AST, ALT, ALKPHOS, BILITOT, BILIDIR, IBILI in the last 72 hours. PT/INR No results for input(s): LABPROT, INR in the last 72 hours.   Studies/Results: No results found.  Impression/Plan: - drop in hemoglobin with occult blood positive stool. Reported black tarry streaks in the brown color stool by nursing staff. - dysphagia mostly to liquid. - Stage IV NSCLC - currently on palliative chemotherapy. - History of alcohol use - Chronic  Hepatitis C - ?? Cirrhosis based on imaging findings.  Recommendations ----------------------- - Patient will benefit from EGD for further evaluation to rule out underlying ulcer disease, esophagitis versus portal gastropathy. - continue twice a day PPI for now. - Further plan based on endoscopy findings.  Risks (bleeding, infection, bowel perforation that could require surgery, sedation-related changes in cardiopulmonary systems), benefits (identification and possible treatment of source of symptoms, exclusion of certain causes of symptoms), and alternatives (watchful waiting, radiographic imaging studies, empiric medical treatment)  were explained to patient in detail and patient wishes to proceed.    LOS: 5 days   Otis Brace  MD, FACP 06/21/2017, 9:01 AM  Contact #   [phone]

## 2017-06-21 NOTE — Progress Notes (Signed)
Arnell Sieving. 2:13 PM  Subjective: Patient with no signs of further bleeding and no GI complaints unless he coughsand his case discussed with my partner Dr. Jacinto Reap and his hospital computer chart reviewed  Objective: Vital signs stable afebrile lungs bilateral rhonchi and decreased breath sounds other exam okayhemoglobin's stable posttransfusion BUN and creatinine okayPET scan from January okay from a GI standpoint  Assessment: Guaiac positive anemia hepatitis C questionable cirrhosis  Plan: Okay to proceed with endoscopy with anesthesia assistance  [facility]  Pager [phone] After 5PM or if no answer call [phone]

## 2017-06-21 NOTE — Progress Notes (Signed)
PROGRESS NOTE    Chad Sparks.  FGH:[id number] DOB: 1950/04/21 DOA: 06/16/2017 PCP: [facility]   Brief Narrative: 67 yr old  male with past medical history of stage IV  non-small cell lung cancer diagnosed in January 2019 currently on palliative chemotherapy, history of COPD, history of hepatitis C, alcohol abuse admitted to the hospital for further evaluation of syncope . Patient was found to have a mild  drop in hemoglobin.FOBT positive. GI is consulted for further evaluation    Patient was also found to have neutropenia on presentation.  Patient had large right-sided pleural effusion and underwent thoracentesis.  Currently waiting for further GI recommendations    Assessment & Plan:   Active Problems:   Hepatitis C   COPD (chronic obstructive pulmonary disease) (HCC)   Anemia   Syncope   Pleural effusion   Protein-calorie malnutrition, severe   Pressure injury of skin   Syncope/Weakness:  Occured after cough, suspected cough related syncope.  He noted that  this has been occurring for a few months.   Trop I q6h x3 (negative) CT chest without evidence of PE, very large right sided effusion On  gentle IV fluids.BP soft. Echocardiogram shows normal left ventricle size and function, grade 1 diastolic dysfunction. Carotid Doppler did not show any significant carotid artery stenosis. Patient evaluated by PT ,no specific recommendation.  Patient is wheelchair-bound.  Stage IV NSCLC: Follows with Dr. Julien Nordmann.  Receiving palliative chemo with carboplatin, alimta, and keytruda with first dose on 06/02/17.  Had port placed on 4/17.   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  Patient underwent thoracocentesis on 06/18/17 with removal of 1.7 L of bloody fluid.  No organisms seen on Gram stain.  Culture negative till date.  patient seemed to have near complete  collapse of the right lung on CT  Anemia: s/p 2 unit pRBC in total. FOBT positive 2. Noted to have melena intermittently. H/H has dropped  From 10.0> 7.9.  Patient doesnot have any bloody bowel movement and denies any change in the color of the stool. GI consulted.   Otis Brace, MD will see the patient today and make further recommendations Continue twice a day PPI, possible EGD today Thrombocytopenia likely contributing  Thrombocytopenia:  Likely 2/2 chemo.  Gradually improving   Neutropenia: Likely 2/2 chemo.  Given a dose of granix on 06/17/17,also discussed with Dr. Julien Nordmann. patient's white count is back to normal  Hyponatremia: Resolved  COPD: Not on acute exacerbation Started on bronchodilators. Started on Anoro 1puff qday Albuterol HFA 2puff q6h prn SLP  evaluation to rule out therapy evaluation  Gerd Cont PPI  Vitamin D def Cont Vitamin D  Severe Protein calorie malnutrition Nutrition following.  Pressure Injury: Wound care following  Hypomagnesemia: Supplemented.      DVT prophylaxis: SCD Code Status: Full Family Communication: None present at the bedside Disposition Plan: Likely home with home health after GI evaluation, EGD today   Consultants: None  Procedures: None  Antimicrobials: None  Subjective: Patient seen and examined the bedside this morning.  Continues to remain weak.  Denies any specific complaints.   Objective: Vitals:   06/20/17 1543 06/20/17 2026 06/21/17 0505 06/21/17 0507  BP: 103/82 117/82 100/74   Pulse: 88 89 87   Resp: 18 20 18    Temp: 97.7 F (36.5 C) 98 F (36.7 C) 98.5 F (36.9 C)   TempSrc: Oral  Oral Oral   SpO2: 100% 99% 100%   Weight:    63.4 kg (139 lb 12.8 oz)  Height:        Intake/Output Summary (Last 24 hours) at 06/21/2017 0945 Last data filed at 06/21/2017 0509 Gross per 24 hour  Intake 1612.92 ml  Output 250 ml  Net 1362.92 ml   Filed Weights   06/19/17 0535 06/20/17 0716 06/21/17 0507    Weight: 60.1 kg (132 lb 6.2 oz) 60.1 kg (132 lb 7.9 oz) 63.4 kg (139 lb 12.8 oz)    Examination:  General exam: Weak ,Not in distress,frial, cachectic HEENT:PERRL,Oral mucosa moist, Ear/Nose normal on gross exam Respiratory system: Decreased air entry in the right side Cardiovascular system: S1 & S2 heard, RRR. No JVD, murmurs, rubs, gallops or clicks, Chemo-Port Gastrointestinal system: Abdomen is nondistended, soft and nontender. No organomegaly or masses felt. Normal bowel sounds heard. Central nervous system: Alert and oriented. No focal neurological deficits. Extremities: No edema, no clubbing ,no cyanosis, distal peripheral pulses palpable. Skin: No rashes, lesions or ulcers,no icterus ,no pallor,Full-thickness wound on the medial left buttock   Data Reviewed: I have personally reviewed following labs and imaging studies  CBC: Recent Labs  Lab 06/17/17 0506 06/18/17 0448 06/19/17 0429 06/20/17 0331 06/21/17 0354  WBC 1.7* 1.4* 3.1* 6.0 9.2  NEUTROABS 0.2* 0.2* 0.8* 2.8 3.0  HGB 7.8* 8.8* 7.9* 10.4* 10.3*  HCT 22.2* 24.9* 22.7* 29.9* 29.9*  MCV 86.4 86.8 87.6 83.8 84.0  PLT 23* 51* 72* 96* 92*   Basic Metabolic Panel: Recent Labs  Lab 06/16/17 1858 06/17/17 0506 06/18/17 0448 06/19/17 0429 06/21/17 0354  NA 130* 130* 130* 135 136  K 3.5 3.5 3.6 3.9 3.8  CL 103 102 103 109 110  CO2 20* 22 20* 22 22  GLUCOSE 105* 99 88 111* 86  BUN 15 15 14 16 13   CREATININE 1.05 0.90 1.06 0.98 0.83  CALCIUM 8.2* 7.9* 7.8* 7.7* 7.8*  MG  --   --  1.4* 1.8  --    GFR: Estimated Creatinine Clearance: 76.4 mL/min (by C-G formula based on SCr of 0.83 mg/dL). Liver Function Tests: Recent Labs  Lab 06/16/17 1858 06/17/17 0506 06/18/17 0448  AST 38 31 33  ALT 24 20 19   ALKPHOS 84 70 71  BILITOT 0.9 1.8* 1.9*  PROT 6.3* 5.5* 5.5*  ALBUMIN 1.9* 1.6* 1.7*   No results for input(s): LIPASE, AMYLASE in the last 168 hours. Recent Labs  Lab 06/16/17 1958  AMMONIA 12    Coagulation Profile: Recent Labs  Lab 06/16/17 0721 06/18/17 0448  INR 1.60 1.75   Cardiac Enzymes: Recent Labs  Lab 06/17/17 0006 06/17/17 0506 06/17/17 1212  TROPONINI <0.03 <0.03 <0.03   BNP (last 3 results) No results for input(s): PROBNP in the last 8760 hours. HbA1C: No results for input(s): HGBA1C in the last 72 hours. CBG: No results for input(s): GLUCAP in the last 168 hours. Lipid Profile: No results for input(s): CHOL, HDL, LDLCALC, TRIG, CHOLHDL, LDLDIRECT in the last 72 hours. Thyroid Function Tests: No results for input(s): TSH, T4TOTAL, FREET4, T3FREE, THYROIDAB in the last 72 hours. Anemia Panel: No results for input(s): VITAMINB12, FOLATE, FERRITIN, TIBC, IRON, RETICCTPCT in the last 72 hours. Sepsis Labs: No results for input(s): PROCALCITON, LATICACIDVEN in the last 168 hours.  Recent Results (from the past 240 hour(s))  Body fluid culture     Status: None (Preliminary result)   Collection Time: 06/18/17  3:46 PM  Result Value  Ref Range Status   Specimen Description   Final    PLEURAL RIGHT Performed at [facility] [address] [place], [place] [address]    Special Requests   Final    NONE Performed at [facility] [place] [address] [place], [place] [address]    Gram Stain   Final    FEW WBC PRESENT, PREDOMINANTLY MONONUCLEAR NO ORGANISMS SEEN    Culture   Final    NO GROWTH 2 DAYS Performed at [facility] [address], [place], [place] [address]    Report Status PENDING  Incomplete         Radiology Studies: No results found.      Scheduled Meds: . feeding supplement (ENSURE ENLIVE)  237 mL Oral BID BM  . folic acid  1 mg Oral Daily  . pantoprazole  40 mg Oral BID  . polyethylene glycol  17 g Oral Daily  . umeclidinium-vilanterol  1 puff Inhalation Daily  . Vitamin D (Ergocalciferol)  50,000 Units Oral Q7 days   Continuous Infusions: . sodium chloride 75 mL/hr at  06/20/17 1427     LOS: 5 days    Time spent: 25 mins.      Reyne Dumas, MD [facility]    If 7PM-7AM, please contact night-coverage [email or web address] Password [facility] 06/21/2017, 9:45 AM

## 2017-06-21 NOTE — Evaluation (Signed)
SLP Cancellation Note  Patient Details Name: Chad Sparks. MRN: [id number] DOB: November 21, 1950   Cancelled treatment:       Reason Eval/Treat Not Completed: Other (comment)(note pt for possible endoscopy today)   Macario Golds 06/21/2017, 11:36 AM  Luanna Salk, [facility] SLP [phone]

## 2017-06-22 ENCOUNTER — Telehealth: Payer: Self-pay | Admitting: Internal Medicine

## 2017-06-22 ENCOUNTER — Encounter: Payer: Self-pay | Admitting: *Deleted

## 2017-06-22 DIAGNOSIS — D6481 Anemia due to antineoplastic chemotherapy: Secondary | ICD-10-CM

## 2017-06-22 DIAGNOSIS — E43 Unspecified severe protein-calorie malnutrition: Secondary | ICD-10-CM

## 2017-06-22 DIAGNOSIS — Z9889 Other specified postprocedural states: Secondary | ICD-10-CM

## 2017-06-22 DIAGNOSIS — T451X5A Adverse effect of antineoplastic and immunosuppressive drugs, initial encounter: Secondary | ICD-10-CM

## 2017-06-22 LAB — BODY FLUID CULTURE: CULTURE: NO GROWTH

## 2017-06-22 LAB — CBC
HCT: 32.1 % — ABNORMAL LOW (ref 39.0–52.0)
HEMOGLOBIN: 10.9 g/dL — AB (ref 13.0–17.0)
MCH: 28.7 pg (ref 26.0–34.0)
MCHC: 34 g/dL (ref 30.0–36.0)
MCV: 84.5 fL (ref 78.0–100.0)
Platelets: 94 10*3/uL — ABNORMAL LOW (ref 150–400)
RBC: 3.8 MIL/uL — AB (ref 4.22–5.81)
RDW: 18.3 % — ABNORMAL HIGH (ref 11.5–15.5)
WBC: 10 10*3/uL (ref 4.0–10.5)

## 2017-06-22 LAB — COMPREHENSIVE METABOLIC PANEL
ALK PHOS: 81 U/L (ref 38–126)
ALT: 15 U/L — AB (ref 17–63)
ANION GAP: 4 — AB (ref 5–15)
AST: 28 U/L (ref 15–41)
Albumin: 1.4 g/dL — ABNORMAL LOW (ref 3.5–5.0)
BUN: 12 mg/dL (ref 6–20)
CALCIUM: 7.9 mg/dL — AB (ref 8.9–10.3)
CO2: 20 mmol/L — ABNORMAL LOW (ref 22–32)
CREATININE: 0.93 mg/dL (ref 0.61–1.24)
Chloride: 111 mmol/L (ref 101–111)
Glucose, Bld: 96 mg/dL (ref 65–99)
Potassium: 4.1 mmol/L (ref 3.5–5.1)
Sodium: 135 mmol/L (ref 135–145)
TOTAL PROTEIN: 4.8 g/dL — AB (ref 6.5–8.1)
Total Bilirubin: 1.4 mg/dL — ABNORMAL HIGH (ref 0.3–1.2)

## 2017-06-22 MED ORDER — PANTOPRAZOLE SODIUM 40 MG PO TBEC
40.0000 mg | DELAYED_RELEASE_TABLET | Freq: Every day | ORAL | Status: DC
  Administered 2017-06-22: 40 mg via ORAL
  Filled 2017-06-22: qty 1

## 2017-06-22 MED ORDER — HYDROCOD POLST-CPM POLST ER 10-8 MG/5ML PO SUER: 5.0000 mL | Freq: Two times a day (BID) | ORAL | 0 refills | Status: AC | PRN

## 2017-06-22 MED ORDER — HEPARIN SOD (PORK) LOCK FLUSH 100 UNIT/ML IV SOLN
500.0000 [IU] | INTRAVENOUS | Status: AC | PRN
  Administered 2017-06-22: 500 [IU]

## 2017-06-22 MED ORDER — POLYETHYLENE GLYCOL 3350 17 G PO PACK
17.0000 g | PACK | Freq: Two times a day (BID) | ORAL | Status: DC
  Administered 2017-06-22: 17 g via ORAL
  Filled 2017-06-22: qty 1

## 2017-06-22 MED ORDER — OMEPRAZOLE 40 MG PO CPDR: 40.0000 mg | DELAYED_RELEASE_CAPSULE | Freq: Two times a day (BID) | ORAL | 1 refills | Status: AC

## 2017-06-22 NOTE — Discharge Summary (Signed)
Physician Discharge Summary  Chad Sparks. JJO:[id number] DOB: 1951/01/30 DOA: 06/16/2017  PCP: [facility]  Admit date: 06/16/2017 Discharge date: 06/22/2017  Admitted From: Home Disposition:  Home  Recommendations for Outpatient Follow-up:  1. Follow up with Oncology in 1-2 weeks 2. Please obtain CBC in one week  Home Health:No NoneEquipment/Devices:None  Discharge Condition:stable CODE STATUS:Full Diet recommendation: Heart Healthy  Brief/Interim Summary: 67 y.o. male past medical history with COPD non-small cell carcinoma diagnosed in 03/21/2016, hepatitis C alcohol abuse and anemia presents with several episodes of associated syncope with cough  Discharge Diagnoses:  Active Problems:   Hepatitis C   COPD (chronic obstructive pulmonary disease) (HCC)   Anemia   Syncope and collapse   Pleural effusion   Protein-calorie malnutrition, severe   Pressure injury of skin   Anemia due to antineoplastic chemotherapy  Syncope and collapse: Seems to be related to the cough, as per patient he has been coughing after surgery. Workup has turned out to be negative, with cardiac biomarkers negative x3, CT Angio of the chest show no evidence of PE.  2D echo showed no aortic stenosis. Patient is wheelchair-bound. He was started on Tussionex as needed.  Stage IV non-small cell carcinoma: Follow-up by [facility] receiving palliative chemotherapy with his first dose on 06/02/2017. Had Port-A-Cath placed on 06/16/2017.  Right-sided malignant pleural effusion: Court thoracocentesis on 03/21/2016 that showed a malignant pleural effusion.  He was re-intervene on 06/18/2017 with 1.7 L of bloody fluid. If this recurs you might need pleurodesis.  Anemia due to antineoplastic therapy: He is status post 2 units of packed red blood cells, his FOBT was positive for 22,019. As per chart there is no signs of overt bleeding GI was consulted and recommended upper endoscopy done on  06/21/2017 that showed antral gastritis. He was started empirically on Protonix will continue this as an outpatient.  Thrombocytopenia: Likely due to chemotherapy.  Neutropenia: Likely due to chemotherapy he was given a Granix dose on 06/17/2017.  This calls with Julien Nordmann he will need to follow-up with the patient as an outpatient.  Hyponatremia likely due to hypovolemia: Resolved with IV fluid hydration.   Discharge Instructions  Discharge Instructions    Diet - low sodium heart healthy   Complete by:  As directed    Increase activity slowly   Complete by:  As directed      Allergies as of 06/22/2017      Reactions   Other Shortness Of Breath, Other (See Comments)   Bee stings, causing breathing problems, itching, hives, welts      Medication List    TAKE these medications   albuterol 108 (90 Base) MCG/ACT inhaler Commonly known as:  PROVENTIL HFA;VENTOLIN HFA Inhale 2 puffs into the lungs every 6 (six) hours as needed for wheezing or shortness of breath.   Calcium 200 MG Tabs Take 200 mg by mouth daily.   chlorpheniramine-HYDROcodone 10-8 MG/5ML Suer Commonly known as:  TUSSIONEX PENNKINETIC ER Take 5 mLs by mouth every 12 (twelve) hours as needed for cough.   dexamethasone 4 MG tablet Commonly known as:  DECADRON 4 mg po bid the day before, day off and day after chemo What changed:    how much to take  how to take this  when to take this  additional instructions   fluticasone 110 MCG/ACT inhaler Commonly known as:  FLOVENT HFA Inhale 2 puffs into the lungs 2 (two) times daily.   folic acid 1 MG tablet Commonly known  as:  FOLVITE Take 1 tablet (1 mg total) by mouth daily.   gabapentin 100 MG capsule Commonly known as:  NEURONTIN Take 1 capsule (100 mg total) by mouth at bedtime.   ipratropium 17 MCG/ACT inhaler Commonly known as:  ATROVENT HFA Inhale 2 puffs into the lungs every 6 (six) hours as needed for wheezing.   lidocaine-prilocaine  cream Commonly known as:  EMLA Apply 1 application topically as needed.   metoCLOPramide 10 MG tablet Commonly known as:  REGLAN Take 1 tablet (10 mg total) by mouth 3 (three) times daily before meals.   omeprazole 40 MG capsule Commonly known as:  PRILOSEC Take 1 capsule (40 mg total) by mouth 2 (two) times daily. What changed:    medication strength  how much to take  when to take this   prochlorperazine 10 MG tablet Commonly known as:  COMPAZINE Take 1 tablet (10 mg total) by mouth every 6 (six) hours as needed for nausea or vomiting.   tamsulosin 0.4 MG Caps capsule Commonly known as:  FLOMAX Take 1 capsule (0.4 mg total) by mouth daily.   Vitamin D (Ergocalciferol) 50000 units Caps capsule Commonly known as:  DRISDOL Take 50,000 Units by mouth every 7 (seven) days.       Allergies  Allergen Reactions  . Other Shortness Of Breath and Other (See Comments)    Bee stings, causing breathing problems, itching, hives, welts    Consultations:  [facility]   Procedures/Studies: Dg Chest 1 View  Result Date: 06/18/2017 CLINICAL DATA:  Status post right thoracentesis. EXAM: CHEST  1 VIEW COMPARISON:  06/17/2017 FINDINGS: Considerable reduction in right-sided pleural effusion is noted although some fluid remains. Improved aeration of the right lung is noted with some persistent consolidation inferiorly. No pneumothorax is noted. Right chest wall port is noted in satisfactory position. Left lung is clear. IMPRESSION: No pneumothorax following thoracentesis on the right. Electronically Signed   By: Inez Catalina M.D.   On: 06/18/2017 16:17   Ct Angio Chest Pe W Or Wo Contrast  Result Date: 06/17/2017 CLINICAL DATA:  Acute onset of cough and shortness of breath. Severe tiredness and generalized weakness. Elevated D-dimer. Current history of Stage IV lung cancer. EXAM: CT ANGIOGRAPHY CHEST WITH CONTRAST TECHNIQUE: Multidetector CT imaging of the chest was performed using the  standard protocol during bolus administration of intravenous contrast. Multiplanar CT image reconstructions and MIPs were obtained to evaluate the vascular anatomy. CONTRAST:  40mL ISOVUE-370 IOPAMIDOL (ISOVUE-370) INJECTION 76% COMPARISON:  PET/CT performed 03/17/2017 FINDINGS: Cardiovascular: There is no evidence of pulmonary embolus. Evaluation for pulmonary embolus is suboptimal in areas of airspace consolidation. The heart is normal in size. The thoracic aorta is grossly unremarkable. Scattered coronary artery calcifications are seen. The great vessels are grossly unremarkable. Mediastinum/Nodes: The mediastinum is grossly unremarkable in appearance. No mediastinal lymphadenopathy is seen. No pericardial effusion is identified. The visualized portions of the thyroid gland is unremarkable. No axillary lymphadenopathy is seen. A right-sided chest port is noted ending at the cavoatrial junction. Soft tissue air is noted about the chest port. Lungs/Pleura: A very large right-sided pleural effusion is noted, occupying nearly the entirety of the right lung. There is near complete collapse of the right lung. A prominent nodule is again noted at the left lung base. No pneumothorax is seen. Upper Abdomen: The visualized portions of the liver and spleen are unremarkable. Ascites is noted at the upper abdomen. Musculoskeletal: No acute osseous abnormalities are identified. The visualized musculature is  unremarkable in appearance. Review of the MIP images confirms the above findings. IMPRESSION: 1. No evidence of pulmonary embolus. 2. Very large right-sided pleural effusion, occupying nearly the entirety of the right lung. Near complete collapse of the right lung. Underlying known metastatic disease is not well characterized. 3. Prominent nodule again noted at the left lung base. 4. Scattered coronary artery calcifications. 5. Ascites noted at the upper abdomen. Electronically Signed   By: Garald Balding M.D.   On:  06/17/2017 04:59   Ir US Guide Vasc Access Right  Result Date: 06/16/2017 INDICATION: 67 year old male with newly diagnosed right lower lobe lung cancer. He presents for placement of a port catheter for durable venous access to facilitate chemotherapy. EXAM: IMPLANTED PORT A CATH PLACEMENT WITH ULTRASOUND AND FLUOROSCOPIC GUIDANCE MEDICATIONS: 2 g Ancef; The antibiotic was administered within an appropriate time interval prior to skin puncture. ANESTHESIA/SEDATION: Versed 0.5 mg IV; Fentanyl 25 mcg IV; Moderate Sedation Time:  26 minutes The patient was continuously monitored during the procedure by the interventional radiology nurse under my direct supervision. FLUOROSCOPY TIME:  0 minutes, 12 seconds (1 mGy) COMPLICATIONS: None immediate. PROCEDURE: The right neck and chest was prepped with chlorhexidine, and draped in the usual sterile fashion using maximum barrier technique (cap and mask, sterile gown, sterile gloves, large sterile sheet, hand hygiene and cutaneous antiseptic). Antibiotic prophylaxis was provided with 2g Ancef administered IV one hour prior to skin incision. Local anesthesia was attained by infiltration with 1% lidocaine with epinephrine. Ultrasound demonstrated patency of the right internal jugular vein, and this was documented with an image. Under real-time ultrasound guidance, this vein was accessed with a 21 gauge micropuncture needle and image documentation was performed. A small dermatotomy was made at the access site with an 11 scalpel. A 0.018" wire was advanced into the SVC and the access needle exchanged for a 25F micropuncture vascular sheath. The 0.018" wire was then removed and a 0.035" wire advanced into the IVC. An appropriate location for the subcutaneous reservoir was selected below the clavicle and an incision was made through the skin and underlying soft tissues. The subcutaneous tissues were then dissected using a combination of blunt and sharp surgical technique and a  pocket was formed. A single lumen power injectable portacatheter was then tunneled through the subcutaneous tissues from the pocket to the dermatotomy and the port reservoir placed within the subcutaneous pocket. The venous access site was then serially dilated and a peel away vascular sheath placed over the wire. The wire was removed and the port catheter advanced into position under fluoroscopic guidance. The catheter tip is positioned in the upper right atrium. This was documented with a spot image. The portacatheter was then tested and found to flush and aspirate well. The port was flushed with saline followed by 100 units/mL heparinized saline. The pocket was then closed in two layers using first subdermal inverted interrupted absorbable sutures followed by a running subcuticular suture. The epidermis was then sealed with Dermabond. The dermatotomy at the venous access site was also closed with a single inverted subdermal suture and the epidermis sealed with Dermabond. IMPRESSION: Successful placement of a right IJ approach Power Port with ultrasound and fluoroscopic guidance. The catheter is ready for use. Electronically Signed   By: Jacqulynn Cadet M.D.   On: 06/16/2017 10:39   Ir Fluoro Guide Port Insertion Right  Result Date: 06/16/2017 INDICATION: 67 year old male with newly diagnosed right lower lobe lung cancer. He presents for placement of a port catheter  for durable venous access to facilitate chemotherapy. EXAM: IMPLANTED PORT A CATH PLACEMENT WITH ULTRASOUND AND FLUOROSCOPIC GUIDANCE MEDICATIONS: 2 g Ancef; The antibiotic was administered within an appropriate time interval prior to skin puncture. ANESTHESIA/SEDATION: Versed 0.5 mg IV; Fentanyl 25 mcg IV; Moderate Sedation Time:  26 minutes The patient was continuously monitored during the procedure by the interventional radiology nurse under my direct supervision. FLUOROSCOPY TIME:  0 minutes, 12 seconds (1 mGy) COMPLICATIONS: None  immediate. PROCEDURE: The right neck and chest was prepped with chlorhexidine, and draped in the usual sterile fashion using maximum barrier technique (cap and mask, sterile gown, sterile gloves, large sterile sheet, hand hygiene and cutaneous antiseptic). Antibiotic prophylaxis was provided with 2g Ancef administered IV one hour prior to skin incision. Local anesthesia was attained by infiltration with 1% lidocaine with epinephrine. Ultrasound demonstrated patency of the right internal jugular vein, and this was documented with an image. Under real-time ultrasound guidance, this vein was accessed with a 21 gauge micropuncture needle and image documentation was performed. A small dermatotomy was made at the access site with an 11 scalpel. A 0.018" wire was advanced into the SVC and the access needle exchanged for a 72F micropuncture vascular sheath. The 0.018" wire was then removed and a 0.035" wire advanced into the IVC. An appropriate location for the subcutaneous reservoir was selected below the clavicle and an incision was made through the skin and underlying soft tissues. The subcutaneous tissues were then dissected using a combination of blunt and sharp surgical technique and a pocket was formed. A single lumen power injectable portacatheter was then tunneled through the subcutaneous tissues from the pocket to the dermatotomy and the port reservoir placed within the subcutaneous pocket. The venous access site was then serially dilated and a peel away vascular sheath placed over the wire. The wire was removed and the port catheter advanced into position under fluoroscopic guidance. The catheter tip is positioned in the upper right atrium. This was documented with a spot image. The portacatheter was then tested and found to flush and aspirate well. The port was flushed with saline followed by 100 units/mL heparinized saline. The pocket was then closed in two layers using first subdermal inverted interrupted  absorbable sutures followed by a running subcuticular suture. The epidermis was then sealed with Dermabond. The dermatotomy at the venous access site was also closed with a single inverted subdermal suture and the epidermis sealed with Dermabond. IMPRESSION: Successful placement of a right IJ approach Power Port with ultrasound and fluoroscopic guidance. The catheter is ready for use. Electronically Signed   By: Jacqulynn Cadet M.D.   On: 06/16/2017 10:39   US Thoracentesis Asp Pleural Space W/img Guide  Result Date: 06/18/2017 INDICATION: Patient with history of stage IV non-small cell lung cancer, hepatitis-C, anemia, thrombocytopenia, dyspnea, cough, right pleural effusion. Request made for diagnostic and therapeutic right thoracentesis. EXAM: ULTRASOUND GUIDED DIAGNOSTIC AND THERAPEUTIC RIGHT THORACENTESIS MEDICATIONS: None COMPLICATIONS: None immediate. PROCEDURE: An ultrasound guided thoracentesis was thoroughly discussed with the patient and questions answered. The benefits, risks, alternatives and complications were also discussed. The patient understands and wishes to proceed with the procedure. Written consent was obtained. Ultrasound was performed to localize and mark an adequate pocket of fluid in the right chest. The area was then prepped and draped in the normal sterile fashion. 1% Lidocaine was used for local anesthesia. Under ultrasound guidance a 6 Fr Safe-T-Centesis catheter was introduced. Thoracentesis was performed. The catheter was removed and a dressing applied.  FINDINGS: A total of approximately 1.7 liters of bloody fluid was removed. Samples were sent to the laboratory as requested by the clinical team. Due to patient coughing/chest discomfort/initial thoracentesis/hypotension only the above amount of fluid was removed today. IMPRESSION: Successful ultrasound guided diagnostic and therapeutic right thoracentesis yielding 1.7 liters of pleural fluid. Follow-up chest x-ray revealed no  pneumothorax. Read by: Rowe Robert, PA-C Electronically Signed   By: Lucrezia Europe M.D.   On: 06/18/2017 16:17     Subjective:  Relates his cough is better, no new complaints tolerating his diet. Discharge Exam: Vitals:   06/21/17 2210 06/22/17 0450  BP: 91/73 92/69  Pulse: 87 97  Resp: (!) 26 17  Temp: 98.1 F (36.7 C) 98.1 F (36.7 C)  SpO2: 99% 100%   Vitals:   06/21/17 1717 06/21/17 2210 06/22/17 0450 06/22/17 0459  BP: 104/76 91/73 92/69    Pulse: (!) 110 87 97   Resp: 18 (!) 26 17   Temp: 98 F (36.7 C) 98.1 F (36.7 C) 98.1 F (36.7 C)   TempSrc: Oral Oral Oral   SpO2: 99% 99% 100%   Weight:    64 kg (141 lb 1.5 oz)  Height:        General: Pt is alert, awake, not in acute distress Cardiovascular: RRR, S1/S2 +, no rubs, no gallops Respiratory: CTA bilaterally, no wheezing, no rhonchi Abdominal: Soft, NT, ND, bowel sounds + Extremities: no edema, no cyanosis    The results of significant diagnostics from this hospitalization (including imaging, microbiology, ancillary and laboratory) are listed below for reference.     Microbiology: Recent Results (from the past 240 hour(s))  Body fluid culture     Status: None (Preliminary result)   Collection Time: 06/18/17  3:46 PM  Result Value Ref Range Status   Specimen Description   Final    PLEURAL RIGHT Performed at [facility], [place] [address] [place], [place] [address]    Special Requests   Final    NONE Performed at [facility], [place] [address] [place], [place] [address]    Gram Stain   Final    FEW WBC PRESENT, PREDOMINANTLY MONONUCLEAR NO ORGANISMS SEEN    Culture   Final    NO GROWTH 3 DAYS Performed at [facility] [address] [place], [place] [address]    Report Status PENDING  Incomplete     Labs: BNP (last 3 results) No results for input(s): BNP in the last 8760 hours. Basic Metabolic Panel: Recent Labs  Lab 06/17/17 0506 06/18/17 0448  06/19/17 0429 06/21/17 0354 06/22/17 0439  NA 130* 130* 135 136 135  K 3.5 3.6 3.9 3.8 4.1  CL 102 103 109 110 111  CO2 22 20* 22 22 20*  GLUCOSE 99 88 111* 86 96  BUN 15 14 16 13 12   CREATININE 0.90 1.06 0.98 0.83 0.93  CALCIUM 7.9* 7.8* 7.7* 7.8* 7.9*  MG  --  1.4* 1.8  --   --    Liver Function Tests: Recent Labs  Lab 06/16/17 1858 06/17/17 0506 06/18/17 0448 06/22/17 0439  AST 38 31 33 28  ALT 24 20 19  15*  ALKPHOS 84 70 71 81  BILITOT 0.9 1.8* 1.9* 1.4*  PROT 6.3* 5.5* 5.5* 4.8*  ALBUMIN 1.9* 1.6* 1.7* 1.4*   No results for input(s): LIPASE, AMYLASE in the last 168 hours. Recent Labs  Lab 06/16/17 1958  AMMONIA 12   CBC: Recent Labs  Lab 06/17/17 0506 06/18/17 0448 06/19/17 0429 06/20/17 0331 06/21/17 0354 06/22/17 0439  WBC 1.7* 1.4* 3.1* 6.0 9.2 10.0  NEUTROABS 0.2* 0.2* 0.8* 2.8 3.0  --   HGB 7.8* 8.8* 7.9* 10.4* 10.3* 10.9*  HCT 22.2* 24.9* 22.7* 29.9* 29.9* 32.1*  MCV 86.4 86.8 87.6 83.8 84.0 84.5  PLT 23* 51* 72* 96* 92* 94*   Cardiac Enzymes: Recent Labs  Lab 06/17/17 0006 06/17/17 0506 06/17/17 1212  TROPONINI <0.03 <0.03 <0.03   BNP: Invalid input(s): POCBNP CBG: No results for input(s): GLUCAP in the last 168 hours. D-Dimer No results for input(s): DDIMER in the last 72 hours. Hgb A1c No results for input(s): HGBA1C in the last 72 hours. Lipid Profile No results for input(s): CHOL, HDL, LDLCALC, TRIG, CHOLHDL, LDLDIRECT in the last 72 hours. Thyroid function studies No results for input(s): TSH, T4TOTAL, T3FREE, THYROIDAB in the last 72 hours.  Invalid input(s): FREET3 Anemia work up No results for input(s): VITAMINB12, FOLATE, FERRITIN, TIBC, IRON, RETICCTPCT in the last 72 hours. Urinalysis    Component Value Date/Time   COLORURINE AMBER BIOCHEMICALS MAY BE AFFECTED BY COLOR (A) 01/18/2009 2327   APPEARANCEUR CLEAR 01/18/2009 2327   LABSPEC 1.026 01/18/2009 2327   PHURINE 6.0 01/18/2009 2327   GLUCOSEU NEGATIVE  01/18/2009 2327   HGBUR NEGATIVE 01/18/2009 2327   BILIRUBINUR neg 02/06/2015 1019   KETONESUR TRACE (A) 01/18/2009 2327   PROTEINUR neg 02/06/2015 1019   PROTEINUR NEGATIVE 01/18/2009 2327   UROBILINOGEN 0.2 02/06/2015 1019   UROBILINOGEN 1.0 01/18/2009 2327   NITRITE neg 02/06/2015 1019   NITRITE NEGATIVE 01/18/2009 2327   LEUKOCYTESUR Negative 02/06/2015 1019   Sepsis Labs Invalid input(s): PROCALCITONIN,  WBC,  LACTICIDVEN Microbiology Recent Results (from the past 240 hour(s))  Body fluid culture     Status: None (Preliminary result)   Collection Time: 06/18/17  3:46 PM  Result Value Ref Range Status   Specimen Description   Final    PLEURAL RIGHT Performed at [facility], [place] [address], [place], [place] [address]    Special Requests   Final    NONE Performed at [facility], [place] [address] [place], [place] [address]    Gram Stain   Final    FEW WBC PRESENT, PREDOMINANTLY MONONUCLEAR NO ORGANISMS SEEN    Culture   Final    NO GROWTH 3 DAYS Performed at [facility] [address] [place], [place] [address]    Report Status PENDING  Incomplete     Time coordinating discharge: 35 minutes  SIGNED:   Charlynne Cousins, MD  [facility] 06/22/2017, 10:06 AM Pager   If 7PM-7AM, please contact night-coverage [email or web address] Password [email or web address]

## 2017-06-22 NOTE — Evaluation (Signed)
Clinical/Bedside Swallow Evaluation Patient Details  Name: Chad Sparks. MRN: [id number] Date of Birth: 03/06/1950  Today's Date: 06/22/2017 Time: SLP Start Time (ACUTE ONLY): 0830 SLP Stop Time (ACUTE ONLY): 0902 SLP Time Calculation (min) (ACUTE ONLY): 32 min  Past Medical History:  Past Medical History:  Diagnosis Date  . Alcohol abuse   . Allergy   . Chronic kidney disease   . COPD (chronic obstructive pulmonary disease) ([place])   . Emphysema of lung ([place])   . Heavy cigarette smoker   . Hepatitis C   . Lung cancer [facility])    Past Surgical History:  Past Surgical History:  Procedure Laterality Date  . ESOPHAGOGASTRODUODENOSCOPY (EGD) WITH PROPOFOL N/A 10/15/2014   Procedure: ESOPHAGOGASTRODUODENOSCOPY (EGD) WITH PROPOFOL;  Surgeon: Wonda Horner, MD;  Location: [facility] ENDOSCOPY;  Service: Endoscopy;  Laterality: N/A;  . IR FLUORO GUIDE PORT INSERTION RIGHT  06/16/2017  . IR US GUIDE VASC ACCESS RIGHT  06/16/2017   HPI:  pt is a 67 yo male adm to [facility] with syncope after cough.  Pt with h/o NSCLC diagnosed 2019 and is undergoing tx.  He is s/p thoracentesis 03/2017 and 05/2017 - Found to have malignant cells and adenocarcinoma.  Pt EGD with small HH, mild inflammation, edema and edematous folds, normal larynx.  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  Swallow eval ordered.    Assessment / Plan / Recommendation Clinical Impression  Pt without indication of oropharyngeal dysphagia via clinical swallow evaluation.  Cranial nerve exam unremarkable.  Pt observed consuming 4 graham crackers, 4 ounces applesauce and water with adequate mastication, clearance and appearance of timely swallow.  Pt does admit to premorbid dysphagia x 8 months to large boluses of liquids *he compensates by taking small sips.  Recommend regular/thin diet with general precautions. No SlP follow up indicated.  SLP Visit Diagnosis: Dysphagia, unspecified (R13.10)    Aspiration Risk  Mild aspiration risk     Diet Recommendation Regular;Thin liquid   Liquid Administration via: Cup Medication Administration: Whole meds with liquid Supervision: Patient able to self feed Compensations: Slow rate;Small sips/bites    Other  Recommendations Oral Care Recommendations: Oral care BID   Follow up Recommendations None      Frequency and Duration            Prognosis        Swallow Study   General Date of Onset: 06/22/17 HPI: pt is a 67 yo male adm to [facility] with syncope after cough.  Pt with h/o NSCLC diagnosed 2019 and is undergoing tx.  He is s/p thoracentesis 03/2017 and 05/2017 - Found to have malignant cells and adenocarcinoma.  Pt EGD with small HH, mild inflammation, edema and edematous folds, normal larynx.  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  Swallow eval ordered.  Type of Study: Bedside Swallow Evaluation Diet Prior to this Study: Thin liquids Respiratory Status: Room air History of Recent Intubation: No Behavior/Cognition: Alert;Cooperative;Pleasant mood Oral Cavity Assessment: Within Functional Limits Oral Care Completed by SLP: No Oral Cavity - Dentition: Missing dentition(upper dentition present) Vision: Functional for self-feeding Self-Feeding Abilities: Able to feed self Patient Positioning: Upright in bed Baseline Vocal Quality: Normal Volitional Cough: Strong Volitional Swallow: Able to elicit    Oral/Motor/Sensory Function Overall Oral Motor/Sensory Function: Within functional limits   Ice Chips Ice chips: Not tested   Thin Liquid Thin Liquid: Within functional limits Presentation: Cup    Nectar  Thick Nectar Thick Liquid: Not tested   Honey Thick Honey Thick Liquid: Not tested   Puree Puree: Within functional limits Presentation: Self Fed;Spoon   Solid   GO   Solid: Within functional limits Presentation: Self Fredirick Lathe 06/22/2017,9:14 AM  Luanna Salk, [facility] SLP [phone]

## 2017-06-22 NOTE — Telephone Encounter (Signed)
Scheduled appt per 4/23 sch message - left message with appt date and time.

## 2017-06-22 NOTE — Progress Notes (Signed)
[facility] Progress Note  Chad Sparks. 67 y.o. 1951-01-16  CC:  Drop in hemoglobin, occult blood positive stool.   Subjective: patient denied any black stool or bright blood per rectum today. Had a bowel movement this morning but complaining of constipation. Denied any nausea or vomiting.  ROS : negative for chest pain. Baseline SOB.    Objective: Vital signs in last 24 hours: Vitals:   06/21/17 2210 06/22/17 0450  BP: 91/73 92/69  Pulse: 87 97  Resp: (!) 26 17  Temp: 98.1 F (36.7 C) 98.1 F (36.7 C)  SpO2: 99% 100%    Physical Exam:  General. Alert, cooperative. Not in acute distress Abdomen. Soft, nontender, mild distended , bowel sounds present.  Lab Results: Recent Labs    06/21/17 0354 06/22/17 0439  NA 136 135  K 3.8 4.1  CL 110 111  CO2 22 20*  GLUCOSE 86 96  BUN 13 12  CREATININE 0.83 0.93  CALCIUM 7.8* 7.9*   Recent Labs    06/22/17 0439  AST 28  ALT 15*  ALKPHOS 81  BILITOT 1.4*  PROT 4.8*  ALBUMIN 1.4*   Recent Labs    06/20/17 0331 06/21/17 0354 06/22/17 0439  WBC 6.0 9.2 10.0  NEUTROABS 2.8 3.0  --   HGB 10.4* 10.3* 10.9*  HCT 29.9* 29.9* 32.1*  MCV 83.8 84.0 84.5  PLT 96* 92* 94*   No results for input(s): LABPROT, INR in the last 72 hours.    Assessment/Plan: - drop in hemoglobin with occult blood positive stool. Reported black tarry streaks in the brown color stool by nursing staff.EGD yesterday showed mild antral gastritis. No evidence of active bleeding. - dysphagia mostly to liquid.EGD negative for esophageal stricture or stenosis yesterday. - Stage IV NSCLC - currently on palliative chemotherapy. - History of alcohol use - Chronic  Hepatitis C - ?? Cirrhosis based on imaging findings. - constipation  Recommendations ----------------------- - patient's hemoglobin is stable.change Protonix to once a day. Increase MiraLAX to twice a day. - PET scan in 03/2017  showed no evidence of colonic lesion.  No plan for inpatient colonoscopy in absence of overt bleeding. - Follow-up with her primary GI at [facility]. - GI will sign off. Call us back if needed    Otis Brace MD, [place] 06/22/2017, 9:07 AM  Contact #  [phone]

## 2017-06-22 NOTE — Progress Notes (Signed)
Oncology Nurse Navigator Documentation  Oncology Nurse Navigator Flowsheets 06/22/2017  Navigator Location [facility]  Navigator Encounter Type Other/I received a call from the [place] case manager. She states patient does not want to come here tomorrow due to not feeling well. I updated Dr. Julien Nordmann. He would like to see patient before starting treatment. I notified scheduling to call and schedule.   Treatment Phase Treatment  Barriers/Navigation Needs Coordination of Care  Interventions Coordination of Care  Coordination of Care Other  Acuity Level 2  Time Spent with Patient 30

## 2017-06-22 NOTE — Progress Notes (Signed)
There are no discharge needs at present time.

## 2017-06-23 ENCOUNTER — Inpatient Hospital Stay: Payer: Non-veteran care

## 2017-06-23 ENCOUNTER — Encounter (HOSPITAL_COMMUNITY): Payer: Self-pay | Admitting: Gastroenterology

## 2017-06-23 ENCOUNTER — Inpatient Hospital Stay: Payer: Non-veteran care | Admitting: Internal Medicine

## 2017-06-23 ENCOUNTER — Ambulatory Visit: Payer: Non-veteran care

## 2017-06-23 ENCOUNTER — Other Ambulatory Visit: Payer: Non-veteran care

## 2017-06-23 ENCOUNTER — Encounter (HOSPITAL_COMMUNITY): Payer: Self-pay | Admitting: Internal Medicine

## 2017-06-29 ENCOUNTER — Telehealth: Payer: Self-pay | Admitting: *Deleted

## 2017-06-29 NOTE — Telephone Encounter (Signed)
S/w [name] regarding pt appointments. [facility] 5/[facility] to appt

## 2017-06-30 ENCOUNTER — Encounter: Payer: Self-pay | Admitting: Nurse Practitioner

## 2017-06-30 ENCOUNTER — Inpatient Hospital Stay: Payer: Non-veteran care | Attending: Urology

## 2017-06-30 ENCOUNTER — Telehealth: Payer: Self-pay | Admitting: Nurse Practitioner

## 2017-06-30 ENCOUNTER — Inpatient Hospital Stay (HOSPITAL_BASED_OUTPATIENT_CLINIC_OR_DEPARTMENT_OTHER): Payer: Non-veteran care | Admitting: Nurse Practitioner

## 2017-06-30 ENCOUNTER — Inpatient Hospital Stay: Payer: Non-veteran care

## 2017-06-30 VITALS — BP 117/81 | HR 100 | Temp 97.7°F | Resp 17 | Ht 74.5 in | Wt 145.6 lb

## 2017-06-30 DIAGNOSIS — F17210 Nicotine dependence, cigarettes, uncomplicated: Secondary | ICD-10-CM | POA: Insufficient documentation

## 2017-06-30 DIAGNOSIS — R600 Localized edema: Secondary | ICD-10-CM

## 2017-06-30 DIAGNOSIS — C3431 Malignant neoplasm of lower lobe, right bronchus or lung: Secondary | ICD-10-CM | POA: Insufficient documentation

## 2017-06-30 DIAGNOSIS — J910 Malignant pleural effusion: Secondary | ICD-10-CM | POA: Insufficient documentation

## 2017-06-30 DIAGNOSIS — D696 Thrombocytopenia, unspecified: Secondary | ICD-10-CM | POA: Diagnosis not present

## 2017-06-30 DIAGNOSIS — C3491 Malignant neoplasm of unspecified part of right bronchus or lung: Secondary | ICD-10-CM

## 2017-06-30 LAB — CBC WITH DIFFERENTIAL (CANCER CENTER ONLY)
Basophils Absolute: 0 10*3/uL (ref 0.0–0.1)
Basophils Relative: 0 %
EOS ABS: 0 10*3/uL (ref 0.0–0.5)
Eosinophils Relative: 0 %
HCT: 29.7 % — ABNORMAL LOW (ref 38.4–49.9)
HEMOGLOBIN: 10.1 g/dL — AB (ref 13.0–17.1)
Lymphocytes Relative: 10 %
Lymphs Abs: 0.9 10*3/uL (ref 0.9–3.3)
MCH: 29 pg (ref 27.2–33.4)
MCHC: 34 g/dL (ref 32.0–36.0)
MCV: 85.3 fL (ref 79.3–98.0)
Monocytes Absolute: 1.5 10*3/uL — ABNORMAL HIGH (ref 0.1–0.9)
Monocytes Relative: 17 %
NEUTROS ABS: 6.6 10*3/uL — AB (ref 1.5–6.5)
NEUTROS PCT: 73 %
Platelet Count: 82 10*3/uL — ABNORMAL LOW (ref 140–400)
RBC: 3.48 MIL/uL — AB (ref 4.20–5.82)
RDW: 19.6 % — ABNORMAL HIGH (ref 11.0–14.6)
WBC: 9.1 10*3/uL (ref 4.0–10.3)

## 2017-06-30 LAB — CMP (CANCER CENTER ONLY)
ALBUMIN: 1.7 g/dL — AB (ref 3.5–5.0)
ALK PHOS: 115 U/L (ref 40–150)
ALT: 17 U/L (ref 0–55)
AST: 41 U/L — ABNORMAL HIGH (ref 5–34)
Anion gap: 4 (ref 3–11)
BUN: 7 mg/dL (ref 7–26)
CALCIUM: 8.5 mg/dL (ref 8.4–10.4)
CO2: 22 mmol/L (ref 22–29)
CREATININE: 0.81 mg/dL (ref 0.70–1.30)
Chloride: 107 mmol/L (ref 98–109)
GFR, Estimated: >60 mL/min (ref >60)
Glucose, Bld: 105 mg/dL (ref 70–140)
Potassium: 3.3 mmol/L — ABNORMAL LOW (ref 3.5–5.1)
SODIUM: 133 mmol/L — AB (ref 136–145)
Total Bilirubin: 1.8 mg/dL — ABNORMAL HIGH (ref 0.2–1.2)
Total Protein: 6 g/dL — ABNORMAL LOW (ref 6.4–8.3)

## 2017-06-30 LAB — TSH: TSH: 2.358 u[IU]/mL (ref 0.320–4.118)

## 2017-06-30 MED ORDER — SODIUM CHLORIDE 0.9% FLUSH
10.0000 mL | INTRAVENOUS | Status: DC | PRN
  Administered 2017-06-30: 10 mL
  Filled 2017-06-30: qty 10

## 2017-06-30 MED ORDER — HEPARIN SOD (PORK) LOCK FLUSH 100 UNIT/ML IV SOLN
500.0000 [IU] | Freq: Once | INTRAVENOUS | Status: AC | PRN
  Administered 2017-06-30: 500 [IU]
  Filled 2017-06-30: qty 5

## 2017-06-30 NOTE — Telephone Encounter (Signed)
Unable to schedule appt per 5/1 los due to capped day - logged and will contact pt when appt is added.

## 2017-06-30 NOTE — Progress Notes (Addendum)
[place] OFFICE PROGRESS NOTE   DIAGNOSIS: Stage IV (T3,N2, M1 a) non-small cell lung cancer, adenocarcinoma presented with large right lower lobe lung mass in addition to mediastinal lymphadenopathy, bilateral pulmonary nodules as well as highly suspicious malignant pleural effusion diagnosed and February 2018. The patient was supposed to start stereotactic body radiotherapy to the early stage disease at that time but he was lost to follow-up and did not show up for his radiotherapy.  PRIOR THERAPY: None  CURRENT THERAPY: Palliative systemic chemotherapy with carboplatin for AUC of 5, Alimta 500 mg/M2 and Keytruda 200 mg IV every 3 weeks.  First dose of 06/02/2017.    INTERVAL HISTORY:   He completed cycle 1 carboplatin/Alimta/Keytruda 06/02/2017.  He was hospitalized 06/16/2017 through 06/22/2017 with syncope.  Per the discharge summary work-up was negative.  He denies nausea/vomiting.  No mouth sores.  No diarrhea.  No rash.  He reports a continued cough.  He notes shortness of breath at times with excessive coughing.  He has had no further syncopal episodes when he coughs.  He has noted swelling of the left arm for the past several days.  Earlier this week his wife also noted swelling of the right leg.  Objective:  Vital signs in last 24 hours:  Blood pressure 117/81, pulse 100, temperature 97.7 F (36.5 C), temperature source Oral, resp. rate 17, height 6' 2.5" (1.892 m), weight 145 lb 9.6 oz (66 kg), SpO2 95 %.    HEENT: No thrush or ulcers. Resp: Clear bilaterally. Cardio: Regular rate and rhythm. GI: Abdomen soft and nontender. Vascular: Right leg with edema below the knee.  Trace edema left leg below the knee.  Left arm edematous elbow to hand. Neuro: Alert and oriented. Skin: No rash. Port-A-Cath without erythema.  Lab Results:  Lab Results  Component Value Date   WBC 9.1 06/30/2017   HGB 10.1 (L) 06/30/2017   HCT 29.7 (L) 06/30/2017   MCV 85.3  06/30/2017   PLT 82 (L) 06/30/2017   NEUTROABS 6.6 (H) 06/30/2017    Imaging:  No results found.  Medications: I have reviewed the patient's current medications.  Assessment/Plan: 1. Stage IV non-small cell lung cancer, adenocarcinoma presented with large right lower lobe lung mass in addition to mediastinal lymphadenopathy as well as bilateral pulmonary nodules and malignant pleural effusion.    Status post cycle 1 carboplatin/Alimta/Keytruda 06/02/2017. 2. Hospitalization with cough/syncope 06/16/2017 through 06/22/2017. 3. Right lower extremity edema.  Referred for venous Doppler.    Disposition: Mr. Risinger has completed 1 cycle of carboplatin/Alimta/Keytruda.  He seems to have tolerated the treatment well.  He has thrombocytopenia on labs today.  We will hold today's treatment and reschedule for 1 week.  He has right leg edema on exam today.  He is being referred for a venous Doppler.  He will return for lab, follow-up and cycle 2 carboplatin/Alimta/Keytruda in 1 week.  He will contact the office in the interim with any problems.  Patient seen with Dr. Julien Nordmann.    Ned Card ANP/GNP-BC   06/30/2017  1:24 PM   ADDENDUM: Hematology/Oncology Attending: I had a face-to-face encounter with the patient today.  I recommended his care plan.  This is a very pleasant 67 years old African-American male with a stage IV non-small cell lung cancer, adenocarcinoma.  The patient was a started on systemic chemotherapy with carboplatin, Alimta and Ketruda (pembrolizumab) status post 1 cycle.  He was supposed to start cycle #2 last week but he was hospitalized  with syncopal episode and cough which significantly improved.  The patient is here today for evaluation before starting cycle #2 of his treatment.  His platelets count are low and we will delay his treatment by 1 week until improvement of his blood count. He also has a swelling of the right lower extremity and we will arrange for the patient  to have ultrasound venous Doppler to rule out deep venous thrombosis. He will come back for follow-up visit in 1 week for evaluation before resuming his systemic therapy. The patient was advised to call immediately if he has any concerning symptoms in the interval.  Disclaimer: This note was dictated with voice recognition software. Similar sounding words can inadvertently be transcribed and may be missed upon review. Eilleen Kempf, MD 06/30/17

## 2017-07-02 ENCOUNTER — Telehealth: Payer: Self-pay | Admitting: *Deleted

## 2017-07-02 ENCOUNTER — Ambulatory Visit (HOSPITAL_COMMUNITY)
Admission: RE | Admit: 2017-07-02 | Discharge: 2017-07-02 | Disposition: A | Discharge status: Home / Self Care | Payer: Non-veteran care | Source: Ambulatory Visit | Attending: Nurse Practitioner | Admitting: Nurse Practitioner

## 2017-07-02 DIAGNOSIS — R609 Edema, unspecified: Secondary | ICD-10-CM

## 2017-07-02 DIAGNOSIS — R600 Localized edema: Secondary | ICD-10-CM | POA: Insufficient documentation

## 2017-07-02 DIAGNOSIS — C3431 Malignant neoplasm of lower lobe, right bronchus or lung: Secondary | ICD-10-CM | POA: Diagnosis not present

## 2017-07-02 NOTE — Telephone Encounter (Signed)
Received call from [place] in vascular lab, pt is negative for DVT. "He has a tremendous amount of fluid there." Message to [place], NP.

## 2017-07-02 NOTE — Progress Notes (Signed)
Right lower extremity veous duplex completed. Preliminary results -There is no evidence of DVT,superficial thrombosis or Baker's cyst. Toma Copier, RVS 07/02/2017 RVS 07/02/2017 9:38 AM

## 2017-07-05 ENCOUNTER — Encounter: Payer: Self-pay | Admitting: Nutrition

## 2017-07-05 NOTE — Progress Notes (Signed)
Provide one case of Ensure Enlive on Wednesday, May 8.

## 2017-07-07 ENCOUNTER — Telehealth: Payer: Self-pay | Admitting: Oncology

## 2017-07-07 ENCOUNTER — Encounter: Payer: Self-pay | Admitting: Oncology

## 2017-07-07 ENCOUNTER — Inpatient Hospital Stay (HOSPITAL_BASED_OUTPATIENT_CLINIC_OR_DEPARTMENT_OTHER): Payer: Non-veteran care | Admitting: Oncology

## 2017-07-07 ENCOUNTER — Inpatient Hospital Stay: Payer: Non-veteran care

## 2017-07-07 VITALS — BP 125/92 | HR 60 | Temp 97.5°F | Resp 18 | Ht 74.5 in | Wt 144.1 lb

## 2017-07-07 DIAGNOSIS — D6481 Anemia due to antineoplastic chemotherapy: Secondary | ICD-10-CM

## 2017-07-07 DIAGNOSIS — J910 Malignant pleural effusion: Secondary | ICD-10-CM | POA: Diagnosis not present

## 2017-07-07 DIAGNOSIS — C3431 Malignant neoplasm of lower lobe, right bronchus or lung: Secondary | ICD-10-CM | POA: Diagnosis not present

## 2017-07-07 DIAGNOSIS — D649 Anemia, unspecified: Secondary | ICD-10-CM

## 2017-07-07 DIAGNOSIS — R600 Localized edema: Secondary | ICD-10-CM | POA: Diagnosis not present

## 2017-07-07 DIAGNOSIS — Z5111 Encounter for antineoplastic chemotherapy: Secondary | ICD-10-CM

## 2017-07-07 DIAGNOSIS — D696 Thrombocytopenia, unspecified: Secondary | ICD-10-CM | POA: Insufficient documentation

## 2017-07-07 DIAGNOSIS — T451X5A Adverse effect of antineoplastic and immunosuppressive drugs, initial encounter: Secondary | ICD-10-CM

## 2017-07-07 DIAGNOSIS — C3491 Malignant neoplasm of unspecified part of right bronchus or lung: Secondary | ICD-10-CM

## 2017-07-07 LAB — CBC WITH DIFFERENTIAL (CANCER CENTER ONLY)
Basophils Absolute: 0 10*3/uL (ref 0.0–0.1)
Basophils Relative: 0 %
Eosinophils Absolute: 0 10*3/uL (ref 0.0–0.5)
Eosinophils Relative: 0 %
HCT: 27 % — ABNORMAL LOW (ref 38.4–49.9)
Hemoglobin: 8.8 g/dL — ABNORMAL LOW (ref 13.0–17.1)
LYMPHS ABS: 0.9 10*3/uL (ref 0.9–3.3)
LYMPHS PCT: 14 %
MCH: 29.4 pg (ref 27.2–33.4)
MCHC: 32.7 g/dL (ref 32.0–36.0)
MCV: 89.9 fL (ref 79.3–98.0)
MONO ABS: 0.4 10*3/uL (ref 0.1–0.9)
MONOS PCT: 7 %
Neutro Abs: 4.6 10*3/uL (ref 1.5–6.5)
Neutrophils Relative %: 79 %
PLATELETS: 69 10*3/uL — AB (ref 140–400)
RBC: 3.01 MIL/uL — AB (ref 4.20–5.82)
RDW: 22.5 % — ABNORMAL HIGH (ref 11.0–14.6)
WBC: 5.9 10*3/uL (ref 4.0–10.3)

## 2017-07-07 LAB — CMP (CANCER CENTER ONLY)
ALBUMIN: 1.8 g/dL — AB (ref 3.5–5.0)
ALT: 19 U/L (ref 0–55)
AST: 42 U/L — AB (ref 5–34)
Alkaline Phosphatase: 118 U/L (ref 40–150)
Anion gap: 3 (ref 3–11)
BUN: 7 mg/dL (ref 7–26)
CHLORIDE: 105 mmol/L (ref 98–109)
CO2: 24 mmol/L (ref 22–29)
Calcium: 8.5 mg/dL (ref 8.4–10.4)
Creatinine: 0.77 mg/dL (ref 0.70–1.30)
GFR, Est AFR Am: >60 mL/min (ref >60)
GFR, Estimated: >60 mL/min (ref >60)
GLUCOSE: 121 mg/dL (ref 70–140)
POTASSIUM: 3.5 mmol/L (ref 3.5–5.1)
SODIUM: 132 mmol/L — AB (ref 136–145)
Total Bilirubin: 1.1 mg/dL (ref 0.2–1.2)
Total Protein: 6.4 g/dL (ref 6.4–8.3)

## 2017-07-07 MED ORDER — SODIUM CHLORIDE 0.9% FLUSH
10.0000 mL | INTRAVENOUS | Status: DC | PRN
  Administered 2017-07-07: 10 mL
  Filled 2017-07-07: qty 10

## 2017-07-07 MED ORDER — HEPARIN SOD (PORK) LOCK FLUSH 100 UNIT/ML IV SOLN
500.0000 [IU] | Freq: Once | INTRAVENOUS | Status: AC
  Administered 2017-07-07: 500 [IU]
  Filled 2017-07-07: qty 5

## 2017-07-07 MED ORDER — FERROUS SULFATE 325 (65 FE) MG PO TBEC: 325.0000 mg | DELAYED_RELEASE_TABLET | Freq: Three times a day (TID) | ORAL | 3 refills | Status: AC

## 2017-07-07 MED ORDER — SODIUM CHLORIDE 0.9% FLUSH
10.0000 mL | Freq: Once | INTRAVENOUS | Status: AC
  Administered 2017-07-07: 10 mL
  Filled 2017-07-07: qty 10

## 2017-07-07 NOTE — Patient Instructions (Signed)

## 2017-07-07 NOTE — Telephone Encounter (Signed)
R/s all appts per 5/8 los - unable to schedule appt for next week due to capped day - logged - will contact patient with appt when added.

## 2017-07-07 NOTE — Progress Notes (Signed)
[place] [id number]  DIAGNOSIS: Stage IV (T3,N2, M1 a) non-small cell lung cancer, adenocarcinoma presented with large right lower lobe lung mass in addition to mediastinal lymphadenopathy, bilateral pulmonary nodules as well as highly suspicious malignant pleural effusion diagnosed and February 2018. The patient was supposed to start stereotactic body radiotherapy to the early stage disease at that time but he was lost to follow-up and did not show up for his radiotherapy.  PRIOR THERAPY: None  CURRENT THERAPY: Palliative systemic chemotherapy with carboplatin for AUC of 5, Alimta 500 mg/M2 and Keytruda 200 mg IV every 3 weeks.First dose of 06/02/2017.  INTERVAL HISTORY: Chad Sparks. 67 y.o. male returns for routine follow-up visit accompanied by his sister.  The patient is feeling fine today has no specific complaints except for ongoing swelling to his bilateral lower extremities.  Right is greater than left.  Doppler ultrasound was performed last week which was negative for DVT.  He does not elevate his legs much.  Does not wear compression stockings.  Patient denies fevers and chills.  Denies chest pain, shortness of breath, cough, hemoptysis.  Denies nausea, vomiting, constipation, diarrhea.  Nuys recent weight loss or night sweats.  Denies bleeding.  Treatment was held last week due to thrombocytopenia.  The patient is here for evaluation prior to cycle #2 of his treatment.  MEDICAL HISTORY: Past Medical History:  Diagnosis Date  . Alcohol abuse   . Allergy   . Chronic kidney disease   . COPD (chronic obstructive pulmonary disease) ([place])   . Emphysema of lung ([place])   . Heavy cigarette smoker   . Hepatitis C   . Lung cancer ([place])     ALLERGIES:  is allergic to other.  MEDICATIONS:  Current Outpatient Medications  Medication Sig Dispense Refill  . albuterol  (PROVENTIL HFA;VENTOLIN HFA) 108 (90 Base) MCG/ACT inhaler Inhale 2 puffs into the lungs every 6 (six) hours as needed for wheezing or shortness of breath. (Patient not taking: Reported on 06/30/2017) 1 Inhaler 3  . Calcium 200 MG TABS Take 200 mg by mouth daily.    . chlorpheniramine-HYDROcodone (TUSSIONEX PENNKINETIC ER) 10-8 MG/5ML SUER Take 5 mLs by mouth every 12 (twelve) hours as needed for cough. 140 mL 0  . dexamethasone (DECADRON) 4 MG tablet 4 mg po bid the day before, day off and day after chemo (Patient taking differently: Take 4 mg by mouth See admin instructions. Take 4 mg by mouth twice the day before, day off and day after chemo) 40 tablet 1  . ferrous sulfate 325 (65 FE) MG EC tablet Take 1 tablet (325 mg total) by mouth 3 (three) times daily with meals. 90 tablet 3  . fluticasone (FLOVENT HFA) 110 MCG/ACT inhaler Inhale 2 puffs into the lungs 2 (two) times daily. (Patient not taking: Reported on 06/30/2017) 1 Inhaler 3  . folic acid (FOLVITE) 1 MG tablet Take 1 tablet (1 mg total) by mouth daily. 30 tablet 4  . gabapentin (NEURONTIN) 100 MG capsule Take 1 capsule (100 mg total) by mouth at bedtime. 30 capsule 0  . ipratropium (ATROVENT HFA) 17 MCG/ACT inhaler Inhale 2 puffs into the lungs every 6 (six) hours as needed for wheezing. (Patient not taking: Reported on 06/30/2017) 1 Inhaler 12  . lidocaine-prilocaine (EMLA) cream Apply 1 application topically as needed. 30 g 0  . metoCLOPramide (REGLAN) 10 MG tablet Take 1 tablet (10 mg  total) by mouth 3 (three) times daily before meals. 30 tablet 0  . omeprazole (PRILOSEC) 40 MG capsule Take 1 capsule (40 mg total) by mouth 2 (two) times daily. 30 capsule 1  . prochlorperazine (COMPAZINE) 10 MG tablet Take 1 tablet (10 mg total) by mouth every 6 (six) hours as needed for nausea or vomiting. 30 tablet 0  . tamsulosin (FLOMAX) 0.4 MG CAPS capsule Take 1 capsule (0.4 mg total) by mouth daily. 30 capsule 3  . Vitamin D, Ergocalciferol, (DRISDOL)  50000 units CAPS capsule Take 50,000 Units by mouth every 7 (seven) days.     No current facility-administered medications for this visit.     SURGICAL HISTORY:  Past Surgical History:  Procedure Laterality Date  . ESOPHAGOGASTRODUODENOSCOPY (EGD) WITH PROPOFOL N/A 10/15/2014   Procedure: ESOPHAGOGASTRODUODENOSCOPY (EGD) WITH PROPOFOL;  Surgeon: Wonda Horner, MD;  Location: [facility] ENDOSCOPY;  Service: Endoscopy;  Laterality: N/A;  . ESOPHAGOGASTRODUODENOSCOPY (EGD) WITH PROPOFOL N/A 06/21/2017   Procedure: ESOPHAGOGASTRODUODENOSCOPY (EGD) WITH PROPOFOL;  Surgeon: Clarene Essex, MD;  Location: [facility] ENDOSCOPY;  Service: Endoscopy;  Laterality: N/A;  . IR FLUORO GUIDE PORT INSERTION RIGHT  06/16/2017  . IR US GUIDE VASC ACCESS RIGHT  06/16/2017    REVIEW OF SYSTEMS:   Review of Systems  Constitutional: Negative for appetite change, chills, fatigue, fever and unexpected weight change.  HENT:   Negative for mouth sores, nosebleeds, sore throat and trouble swallowing.   Eyes: Negative for eye problems and icterus.  Respiratory: Negative for cough, hemoptysis, shortness of breath and wheezing.   Cardiovascular: Negative for chest pain.  Positive for bilateral lower extremity edema, right greater than left.  Gastrointestinal: Negative for abdominal pain, constipation, diarrhea, nausea and vomiting.  Genitourinary: Negative for bladder incontinence, difficulty urinating, dysuria, frequency and hematuria.   Musculoskeletal: Negative for back pain, neck pain and neck stiffness.  Skin: Negative for itching and rash.  Neurological: Negative for dizziness, extremity weakness, headaches, light-headedness and seizures.  Hematological: Negative for adenopathy. Does not bruise/bleed easily.  Psychiatric/Behavioral: Negative for confusion, depression and sleep disturbance. The patient is not nervous/anxious.     PHYSICAL EXAMINATION:  Blood pressure (!) 125/92, pulse 60, temperature (!) 97.5 F (36.4 C),  temperature source Oral, resp. rate 18, height 6' 2.5" (1.892 m), weight 144 lb 1.6 oz (65.4 kg), SpO2 100 %.  ECOG PERFORMANCE STATUS: 1 - Symptomatic but completely ambulatory  Physical Exam  Constitutional: Oriented to person, place, and time and well-developed, well-nourished, and in no distress. No distress.  HENT:  Head: Normocephalic and atraumatic.  Mouth/Throat: Oropharynx is clear and moist. No oropharyngeal exudate.  Eyes: Conjunctivae are normal. Right eye exhibits no discharge. Left eye exhibits no discharge. No scleral icterus.  Neck: Normal range of motion. Neck supple.  Cardiovascular: Normal rate, regular rhythm, normal heart sounds and intact distal pulses.  Trace lower extremity edema on the left.  Right lower extremity with 1+ edema.  Pulmonary/Chest: Effort normal and breath sounds normal. No respiratory distress. No wheezes. No rales.  Abdominal: Soft. Bowel sounds are normal. Exhibits no distension and no mass. There is no tenderness.  Musculoskeletal: Normal range of motion. Exhibits no edema.  Lymphadenopathy:    No cervical adenopathy.  Neurological: Alert and oriented to person, place, and time. Exhibits normal muscle tone. Coordination normal.  Skin: Skin is warm and dry. No rash noted. Not diaphoretic. No erythema. No pallor.  Psychiatric: Mood, memory and judgment normal.  Vitals reviewed.  LABORATORY DATA: Lab Results  Component  Value Date   WBC 5.9 07/07/2017   HGB 8.8 (L) 07/07/2017   HCT 27.0 (L) 07/07/2017   MCV 89.9 07/07/2017   PLT 69 (L) 07/07/2017      Chemistry      Component Value Date/Time   NA 132 (L) 07/07/2017 0910   K 3.5 07/07/2017 0910   CL 105 07/07/2017 0910   CO2 24 07/07/2017 0910   BUN 7 07/07/2017 0910   CREATININE 0.77 07/07/2017 0910      Component Value Date/Time   CALCIUM 8.5 07/07/2017 0910   ALKPHOS 118 07/07/2017 0910   AST 42 (H) 07/07/2017 0910   ALT 19 07/07/2017 0910   BILITOT 1.1 07/07/2017 0910        RADIOGRAPHIC STUDIES:  Dg Chest 1 View  Result Date: 06/18/2017 CLINICAL DATA:  Status post right thoracentesis. EXAM: CHEST  1 VIEW COMPARISON:  06/17/2017 FINDINGS: Considerable reduction in right-sided pleural effusion is noted although some fluid remains. Improved aeration of the right lung is noted with some persistent consolidation inferiorly. No pneumothorax is noted. Right chest wall port is noted in satisfactory position. Left lung is clear. IMPRESSION: No pneumothorax following thoracentesis on the right. Electronically Signed   By: Inez Catalina M.D.   On: 06/18/2017 16:17   Ct Angio Chest Pe W Or Wo Contrast  Result Date: 06/17/2017 CLINICAL DATA:  Acute onset of cough and shortness of breath. Severe tiredness and generalized weakness. Elevated D-dimer. Current history of Stage IV lung cancer. EXAM: CT ANGIOGRAPHY CHEST WITH CONTRAST TECHNIQUE: Multidetector CT imaging of the chest was performed using the standard protocol during bolus administration of intravenous contrast. Multiplanar CT image reconstructions and MIPs were obtained to evaluate the vascular anatomy. CONTRAST:  17mL ISOVUE-370 IOPAMIDOL (ISOVUE-370) INJECTION 76% COMPARISON:  PET/CT performed 03/17/2017 FINDINGS: Cardiovascular: There is no evidence of pulmonary embolus. Evaluation for pulmonary embolus is suboptimal in areas of airspace consolidation. The heart is normal in size. The thoracic aorta is grossly unremarkable. Scattered coronary artery calcifications are seen. The great vessels are grossly unremarkable. Mediastinum/Nodes: The mediastinum is grossly unremarkable in appearance. No mediastinal lymphadenopathy is seen. No pericardial effusion is identified. The visualized portions of the thyroid gland is unremarkable. No axillary lymphadenopathy is seen. A right-sided chest port is noted ending at the cavoatrial junction. Soft tissue air is noted about the chest port. Lungs/Pleura: A very large right-sided  pleural effusion is noted, occupying nearly the entirety of the right lung. There is near complete collapse of the right lung. A prominent nodule is again noted at the left lung base. No pneumothorax is seen. Upper Abdomen: The visualized portions of the liver and spleen are unremarkable. Ascites is noted at the upper abdomen. Musculoskeletal: No acute osseous abnormalities are identified. The visualized musculature is unremarkable in appearance. Review of the MIP images confirms the above findings. IMPRESSION: 1. No evidence of pulmonary embolus. 2. Very large right-sided pleural effusion, occupying nearly the entirety of the right lung. Near complete collapse of the right lung. Underlying known metastatic disease is not well characterized. 3. Prominent nodule again noted at the left lung base. 4. Scattered coronary artery calcifications. 5. Ascites noted at the upper abdomen. Electronically Signed   By: Garald Balding M.D.   On: 06/17/2017 04:59   Ir US Guide Vasc Access Right  Result Date: 06/16/2017 INDICATION: 67 year old male with newly diagnosed right lower lobe lung cancer. He presents for placement of a port catheter for durable venous access to facilitate chemotherapy. EXAM: IMPLANTED  PORT A CATH PLACEMENT WITH ULTRASOUND AND FLUOROSCOPIC GUIDANCE MEDICATIONS: 2 g Ancef; The antibiotic was administered within an appropriate time interval prior to skin puncture. ANESTHESIA/SEDATION: Versed 0.5 mg IV; Fentanyl 25 mcg IV; Moderate Sedation Time:  26 minutes The patient was continuously monitored during the procedure by the interventional radiology nurse under my direct supervision. FLUOROSCOPY TIME:  0 minutes, 12 seconds (1 mGy) COMPLICATIONS: None immediate. PROCEDURE: The right neck and chest was prepped with chlorhexidine, and draped in the usual sterile fashion using maximum barrier technique (cap and mask, sterile gown, sterile gloves, large sterile sheet, hand hygiene and cutaneous antiseptic).  Antibiotic prophylaxis was provided with 2g Ancef administered IV one hour prior to skin incision. Local anesthesia was attained by infiltration with 1% lidocaine with epinephrine. Ultrasound demonstrated patency of the right internal jugular vein, and this was documented with an image. Under real-time ultrasound guidance, this vein was accessed with a 21 gauge micropuncture needle and image documentation was performed. A small dermatotomy was made at the access site with an 11 scalpel. A 0.018" wire was advanced into the SVC and the access needle exchanged for a 63F micropuncture vascular sheath. The 0.018" wire was then removed and a 0.035" wire advanced into the IVC. An appropriate location for the subcutaneous reservoir was selected below the clavicle and an incision was made through the skin and underlying soft tissues. The subcutaneous tissues were then dissected using a combination of blunt and sharp surgical technique and a pocket was formed. A single lumen power injectable portacatheter was then tunneled through the subcutaneous tissues from the pocket to the dermatotomy and the port reservoir placed within the subcutaneous pocket. The venous access site was then serially dilated and a peel away vascular sheath placed over the wire. The wire was removed and the port catheter advanced into position under fluoroscopic guidance. The catheter tip is positioned in the upper right atrium. This was documented with a spot image. The portacatheter was then tested and found to flush and aspirate well. The port was flushed with saline followed by 100 units/mL heparinized saline. The pocket was then closed in two layers using first subdermal inverted interrupted absorbable sutures followed by a running subcuticular suture. The epidermis was then sealed with Dermabond. The dermatotomy at the venous access site was also closed with a single inverted subdermal suture and the epidermis sealed with Dermabond. IMPRESSION:  Successful placement of a right IJ approach Power Port with ultrasound and fluoroscopic guidance. The catheter is ready for use. Electronically Signed   By: Jacqulynn Cadet M.D.   On: 06/16/2017 10:39   Ir Fluoro Guide Port Insertion Right  Result Date: 06/16/2017 INDICATION: 67 year old male with newly diagnosed right lower lobe lung cancer. He presents for placement of a port catheter for durable venous access to facilitate chemotherapy. EXAM: IMPLANTED PORT A CATH PLACEMENT WITH ULTRASOUND AND FLUOROSCOPIC GUIDANCE MEDICATIONS: 2 g Ancef; The antibiotic was administered within an appropriate time interval prior to skin puncture. ANESTHESIA/SEDATION: Versed 0.5 mg IV; Fentanyl 25 mcg IV; Moderate Sedation Time:  26 minutes The patient was continuously monitored during the procedure by the interventional radiology nurse under my direct supervision. FLUOROSCOPY TIME:  0 minutes, 12 seconds (1 mGy) COMPLICATIONS: None immediate. PROCEDURE: The right neck and chest was prepped with chlorhexidine, and draped in the usual sterile fashion using maximum barrier technique (cap and mask, sterile gown, sterile gloves, large sterile sheet, hand hygiene and cutaneous antiseptic). Antibiotic prophylaxis was provided with 2g Ancef administered IV  one hour prior to skin incision. Local anesthesia was attained by infiltration with 1% lidocaine with epinephrine. Ultrasound demonstrated patency of the right internal jugular vein, and this was documented with an image. Under real-time ultrasound guidance, this vein was accessed with a 21 gauge micropuncture needle and image documentation was performed. A small dermatotomy was made at the access site with an 11 scalpel. A 0.018" wire was advanced into the SVC and the access needle exchanged for a 69F micropuncture vascular sheath. The 0.018" wire was then removed and a 0.035" wire advanced into the IVC. An appropriate location for the subcutaneous reservoir was selected below  the clavicle and an incision was made through the skin and underlying soft tissues. The subcutaneous tissues were then dissected using a combination of blunt and sharp surgical technique and a pocket was formed. A single lumen power injectable portacatheter was then tunneled through the subcutaneous tissues from the pocket to the dermatotomy and the port reservoir placed within the subcutaneous pocket. The venous access site was then serially dilated and a peel away vascular sheath placed over the wire. The wire was removed and the port catheter advanced into position under fluoroscopic guidance. The catheter tip is positioned in the upper right atrium. This was documented with a spot image. The portacatheter was then tested and found to flush and aspirate well. The port was flushed with saline followed by 100 units/mL heparinized saline. The pocket was then closed in two layers using first subdermal inverted interrupted absorbable sutures followed by a running subcuticular suture. The epidermis was then sealed with Dermabond. The dermatotomy at the venous access site was also closed with a single inverted subdermal suture and the epidermis sealed with Dermabond. IMPRESSION: Successful placement of a right IJ approach Power Port with ultrasound and fluoroscopic guidance. The catheter is ready for use. Electronically Signed   By: Jacqulynn Cadet M.D.   On: 06/16/2017 10:39   US Thoracentesis Asp Pleural Space W/img Guide  Result Date: 06/18/2017 INDICATION: Patient with history of stage IV non-small cell lung cancer, hepatitis-C, anemia, thrombocytopenia, dyspnea, cough, right pleural effusion. Request made for diagnostic and therapeutic right thoracentesis. EXAM: ULTRASOUND GUIDED DIAGNOSTIC AND THERAPEUTIC RIGHT THORACENTESIS MEDICATIONS: None COMPLICATIONS: None immediate. PROCEDURE: An ultrasound guided thoracentesis was thoroughly discussed with the patient and questions answered. The benefits, risks,  alternatives and complications were also discussed. The patient understands and wishes to proceed with the procedure. Written consent was obtained. Ultrasound was performed to localize and mark an adequate pocket of fluid in the right chest. The area was then prepped and draped in the normal sterile fashion. 1% Lidocaine was used for local anesthesia. Under ultrasound guidance a 6 Fr Safe-T-Centesis catheter was introduced. Thoracentesis was performed. The catheter was removed and a dressing applied. FINDINGS: A total of approximately 1.7 liters of bloody fluid was removed. Samples were sent to the laboratory as requested by the clinical team. Due to patient coughing/chest discomfort/initial thoracentesis/hypotension only the above amount of fluid was removed today. IMPRESSION: Successful ultrasound guided diagnostic and therapeutic right thoracentesis yielding 1.7 liters of pleural fluid. Follow-up chest x-ray revealed no pneumothorax. Read by: Rowe Robert, PA-C Electronically Signed   By: Lucrezia Europe M.D.   On: 06/18/2017 16:17     ASSESSMENT/PLAN:  Primary cancer of right lower lobe of lung [facility]) This is a very pleasant 67 year old African-American male recently diagnosed with stage IV (T3,N2, M1 a) non-small cell lung cancer, adenocarcinoma presented with large right lower lobe lung  mass in addition to mediastinal lymphadenopathy, bilateral pulmonary nodules as well as highly suspicious malignant pleural effusion diagnosed and February 2018. The patient was supposed to start stereotactic body radiotherapy to the early stage disease at that time but he was lost to follow-up and did not show up for his radiotherapy. The patient is currently on palliative systemic chemotherapy with carboplatin for AUC of 5, Alimta 500 mg/M2 and Keytruda 200 mg IV every 3 weeks.  Status post 1 cycle which he tolerated fairly well.  Treatment was held last week due to thrombocytopenia. Returns today for evaluation prior to  cycle #2.  Counts been reviewed.  Platelet count remains low at 69,000.  Logan is 8.8.  He is asymptomatic.  Denies bleeding. Counts reviewed with Dr. Julien Nordmann.  Will hold his chemotherapy again this week. He was given a prescription for ferrous sulfate 325 mg 2-3 times a day.  The patient's sister will bring this prescription to the [place] so that he can get this medication at little or no cost. The patient will return next week for repeat lab work and possible treatment if his platelet count improves.  For lower extremity edema, his Doppler ultrasound was negative.  Recommend that he elevate his legs as much as possible.  We also discussed reducing his sodium intake and obtaining compression stockings.  Follow-up visit will be in approximately 4 weeks for evaluation prior to cycle #3 of treatment.  The patient was advised to call immediately if he has any concerning symptoms in the interval. The patient voices understanding of current disease status and treatment options and is in agreement with the current care plan.  All questions were answered. The patient knows to call the clinic with any problems, questions or concerns. We can certainly see the patient much sooner if necessary.   No orders of the defined types were placed in this encounter.  Mikey Bussing, DNP, AGPCNP-BC, AOCNP 07/07/17

## 2017-07-07 NOTE — Assessment & Plan Note (Addendum)
This is a very pleasant 67 year old African-American male recently diagnosed with stage IV (T3,N2, M1 a) non-small cell lung cancer, adenocarcinoma presented with large right lower lobe lung mass in addition to mediastinal lymphadenopathy, bilateral pulmonary nodules as well as highly suspicious malignant pleural effusion diagnosed and February 2018. The patient was supposed to start stereotactic body radiotherapy to the early stage disease at that time but he was lost to follow-up and did not show up for his radiotherapy. The patient is currently on palliative systemic chemotherapy with carboplatin for AUC of 5, Alimta 500 mg/M2 and Keytruda 200 mg IV every 3 weeks.  Status post 1 cycle which he tolerated fairly well.  Treatment was held last week due to thrombocytopenia. Returns today for evaluation prior to cycle #2.  Counts been reviewed.  Platelet count remains low at 69,000.  Logan is 8.8.  He is asymptomatic.  Denies bleeding. Counts reviewed with Dr. Julien Nordmann.  Will hold his chemotherapy again this week. He was given a prescription for ferrous sulfate 325 mg 2-3 times a day.  The patient's sister will bring this prescription to the [place] so that he can get this medication at little or no cost. The patient will return next week for repeat lab work and possible treatment if his platelet count improves.  For lower extremity edema, his Doppler ultrasound was negative.  Recommend that he elevate his legs as much as possible.  We also discussed reducing his sodium intake and obtaining compression stockings.  Follow-up visit will be in approximately 4 weeks for evaluation prior to cycle #3 of treatment.  The patient was advised to call immediately if he has any concerning symptoms in the interval. The patient voices understanding of current disease status and treatment options and is in agreement with the current care plan.  All questions were answered. The patient knows to call the clinic with any  problems, questions or concerns. We can certainly see the patient much sooner if necessary.

## 2017-07-08 ENCOUNTER — Telehealth: Payer: Self-pay | Admitting: Oncology

## 2017-07-08 NOTE — Telephone Encounter (Signed)
Scheduled appt per 5/8 los- sister Vanita Ingles aware of appts added for 5/15.

## 2017-07-14 ENCOUNTER — Encounter: Payer: Non-veteran care | Admitting: Nutrition

## 2017-07-14 ENCOUNTER — Inpatient Hospital Stay: Payer: Non-veteran care

## 2017-07-14 ENCOUNTER — Other Ambulatory Visit: Payer: Non-veteran care

## 2017-07-14 ENCOUNTER — Ambulatory Visit: Payer: Non-veteran care | Admitting: Internal Medicine

## 2017-07-14 ENCOUNTER — Ambulatory Visit: Payer: Non-veteran care

## 2017-07-14 VITALS — BP 123/86 | HR 66 | Temp 97.7°F | Resp 18

## 2017-07-14 DIAGNOSIS — C3491 Malignant neoplasm of unspecified part of right bronchus or lung: Secondary | ICD-10-CM

## 2017-07-14 DIAGNOSIS — C3431 Malignant neoplasm of lower lobe, right bronchus or lung: Secondary | ICD-10-CM

## 2017-07-14 LAB — CMP (CANCER CENTER ONLY)
ALK PHOS: 129 U/L (ref 40–150)
ALT: 25 U/L (ref 0–55)
ANION GAP: 3 (ref 3–11)
AST: 38 U/L — ABNORMAL HIGH (ref 5–34)
Albumin: 1.8 g/dL — ABNORMAL LOW (ref 3.5–5.0)
BILIRUBIN TOTAL: 1.1 mg/dL (ref 0.2–1.2)
BUN: 8 mg/dL (ref 7–26)
CALCIUM: 8.2 mg/dL — AB (ref 8.4–10.4)
CO2: 26 mmol/L (ref 22–29)
CREATININE: 0.79 mg/dL (ref 0.70–1.30)
Chloride: 105 mmol/L (ref 98–109)
GFR, Est AFR Am: >60 mL/min (ref >60)
GFR, Estimated: >60 mL/min (ref >60)
GLUCOSE: 131 mg/dL (ref 70–140)
Potassium: 3.6 mmol/L (ref 3.5–5.1)
SODIUM: 134 mmol/L — AB (ref 136–145)
TOTAL PROTEIN: 6.6 g/dL (ref 6.4–8.3)

## 2017-07-14 LAB — CBC WITH DIFFERENTIAL (CANCER CENTER ONLY)
BASOS PCT: 0 %
Basophils Absolute: 0 10*3/uL (ref 0.0–0.1)
EOS ABS: 0 10*3/uL (ref 0.0–0.5)
EOS PCT: 0 %
HCT: 29.5 % — ABNORMAL LOW (ref 38.4–49.9)
Hemoglobin: 9.6 g/dL — ABNORMAL LOW (ref 13.0–17.1)
Lymphocytes Relative: 13 %
Lymphs Abs: 1 10*3/uL (ref 0.9–3.3)
MCH: 30.1 pg (ref 27.2–33.4)
MCHC: 32.5 g/dL (ref 32.0–36.0)
MCV: 92.7 fL (ref 79.3–98.0)
MONO ABS: 1 10*3/uL — AB (ref 0.1–0.9)
MONOS PCT: 13 %
Neutro Abs: 5.7 10*3/uL (ref 1.5–6.5)
Neutrophils Relative %: 74 %
PLATELETS: 90 10*3/uL — AB (ref 140–400)
RBC: 3.19 MIL/uL — ABNORMAL LOW (ref 4.20–5.82)
RDW: 25.2 % — AB (ref 11.0–14.6)
WBC Count: 7.7 10*3/uL (ref 4.0–10.3)

## 2017-07-14 MED ORDER — SODIUM CHLORIDE 0.9% FLUSH
10.0000 mL | INTRAVENOUS | Status: DC | PRN
  Administered 2017-07-14: 10 mL
  Filled 2017-07-14: qty 10

## 2017-07-14 MED ORDER — HEPARIN SOD (PORK) LOCK FLUSH 100 UNIT/ML IV SOLN
500.0000 [IU] | Freq: Once | INTRAVENOUS | Status: AC | PRN
  Administered 2017-07-14: 500 [IU]
  Filled 2017-07-14: qty 5

## 2017-07-14 NOTE — Progress Notes (Signed)
No treatment today due to 07/14/17 lab results per Dr. Julien Nordmann.

## 2017-07-14 NOTE — Patient Instructions (Signed)
Pancytopenia Pancytopenia is a condition in which there is an abnormally low amount (deficiency) of the following blood cells:  Red blood cells (RBCs). Having too few RBCs is called anemia.  White blood cells (WBCs). Having too few WBCs is called leukopenia.  Cells that help the blood clot (platelets). Having too few platelets is called thrombocytopenia.  Cells that become blood cells (stem cells) are made in the soft tissue inside the bones (bone marrow). All blood cells have a limited lifespan. Blood cells are constantly replaced with new blood cells from the bone marrow. Pancytopenia can be caused by any condition or disease that:  Destroys the ability of bone marrow to make blood cells.  Causes bone marrow to make blood cellsthat cannot survive after they leave the bone marrow.  What are the causes? There are many possible causes of this condition. In some cases, the cause is not known. Common causes include:  A disease that causes bone marrow to make immature blood cells (megaloblastic anemia).  A blood disorder that makes bone marrow unable to produce enough new RBCs (aplastic anemia or bone marrow failure).  An enlarged spleen (hypersplenism). An enlarged spleen can trap blood cells and destroy them faster than they can be replaced.  Inherited diseases of the blood or bone marrow.  Cancers that affect bone marrow.  Certain medicines, such as: ? Chemotherapy. ? Medicines that reduce the activity of the immune system (immunosuppressantmedicines).  Exposure to radiation.  Severe infections.  What increases the risk? The following factors may make you more likely to develop this condition:  Being 80?69 years old.  Being a man.  Having a family history of a blood or bone marrow disease.  Having certain conditions, such as: ? Alcohol use disorder. ? HIV (human immunodeficiency virus) or AIDS (acquired immunodeficiency syndrome). ? Cancer. ? Conditions in which the  body's disease-fighting system attacks normal tissues (autoimmune diseases).  What are the signs or symptoms? Symptoms of this condition vary depending on the cause and may include:  Anemia.  Weakness.  Shortness of breath.  Unusual bruising and bleeding.  Frequent infections.  Fatigue.  Fever.  Pale skin.  Bone pain.  Night sweats.  Weight loss.  Headache.  Dizziness.  Feeling unusually cold.  How is this diagnosed? This condition may be diagnosed based on:  Your symptoms.  Your medical history.  A physical exam.  Removal of a sample of bone marrow to be examined under a microscope (biopsy). This is done by inserting a needle into a bone to remove fluid and cells (aspiration).  A complete blood count (CBC). This is a group of tests that measures characteristics of WBCs, RBCs, and platelets.  A peripheral blood smear. This test examines your blood under a microscope to provide information about drugs and diseases that affect RBCs, WBCs, and platelets.  Reticulocyte count. This is a test that measures the amount of new or immature RBCs (reticulocytes) that are made by your bone marrow.  Imaging studies of your spleen or liver, such as X-rays.  A test to measure your vitamin B12 level.  Tests for viruses.  How is this treated? Treatment for this condition depends on the cause. Treatment may include:  Immunosuppressant medicines.  Antibiotic medicine.  Vitamin B12. This may be given as a treatment for megaloblastic anemia.  Medicines that help the bone marrow make blood cells (bone marrow stimulating drugs).  A bone marrow transplant.  Receiving donated blood through an IV tube (blood transfusion).  A  procedure to remove your spleen (splenectomy). This may be done as a treatment for hypersplenism.  Follow these instructions at home: Caring for Your Body   Wash your hands often with soap and water. If soap and water are not available, use hand  sanitizer.  Brush your teeth twice a day, and floss at least once a day. It is recommended that you visit the dentist every six months.  Stay up to date on your vaccinations, including a yearly (annual) flu shot. Ask your health care provider which vaccines you should get, such as a vaccine against pneumonia. Medicines  Take over-the-counter and prescription medicines only as told by your health care provider.  If you were prescribed an antibiotic medicine, take it as told by your health care provider. Do not stop taking the antibiotic even if you start to feel better. General instructions  Work with your health care provider to manage your condition and educate yourself about your condition.  Do not participate in contact sports or dangerous activities. Ask your health care provider what activities are safe for you.  Follow food safety recommendations as told by your health care provider.  During cold and flu season, avoid crowded places and avoid contact with people who are sick. Flu season is typically between the months of October and May.  Keep all follow-up visits as told by your health care provider. This is important. Contact a health care provider if:  You have a fever.  You bruise or bleed easily.  You are dizzy.  You feel unusually weak or tired. Get help right away if:  You have bleeding that does not stop.  You have wheezing or shortness of breath.  You have chest pain. These symptoms may represent a serious problem that is an emergency. Do not wait to see if the symptoms will go away. Get medical help right away. Call your local emergency services (911 in the [place] Do not drive yourself to the hospital. This information is not intended to replace advice given to you by your health care provider. Make sure you discuss any questions you have with your health care provider. Document Released: 03/15/2015 Document Revised: 07/25/2015 Document Reviewed:  03/14/2014 Elsevier Interactive Patient Education  [employer].

## 2017-07-15 ENCOUNTER — Telehealth: Payer: Self-pay | Admitting: Internal Medicine

## 2017-07-15 NOTE — Telephone Encounter (Signed)
Scheduled appt per 5/15 sch message - left message with appt date and time.

## 2017-07-21 ENCOUNTER — Other Ambulatory Visit: Payer: Non-veteran care

## 2017-07-23 ENCOUNTER — Inpatient Hospital Stay: Payer: Non-veteran care

## 2017-07-23 ENCOUNTER — Other Ambulatory Visit: Payer: Self-pay | Admitting: *Deleted

## 2017-07-23 DIAGNOSIS — C3431 Malignant neoplasm of lower lobe, right bronchus or lung: Secondary | ICD-10-CM | POA: Diagnosis not present

## 2017-07-23 DIAGNOSIS — C3491 Malignant neoplasm of unspecified part of right bronchus or lung: Secondary | ICD-10-CM

## 2017-07-23 LAB — CMP (CANCER CENTER ONLY)
ALT: 24 U/L (ref 0–55)
AST: 44 U/L — ABNORMAL HIGH (ref 5–34)
Albumin: 1.8 g/dL — ABNORMAL LOW (ref 3.5–5.0)
Alkaline Phosphatase: 154 U/L — ABNORMAL HIGH (ref 40–150)
Anion gap: 7 (ref 3–11)
BUN: 8 mg/dL (ref 7–26)
CHLORIDE: 104 mmol/L (ref 98–109)
CO2: 21 mmol/L — AB (ref 22–29)
CREATININE: 0.84 mg/dL (ref 0.70–1.30)
Calcium: 7.8 mg/dL — ABNORMAL LOW (ref 8.4–10.4)
GFR, Estimated: >60 mL/min (ref >60)
GLUCOSE: 108 mg/dL (ref 70–140)
Potassium: 3.6 mmol/L (ref 3.5–5.1)
SODIUM: 132 mmol/L — AB (ref 136–145)
Total Bilirubin: 1.8 mg/dL — ABNORMAL HIGH (ref 0.2–1.2)
Total Protein: 6.9 g/dL (ref 6.4–8.3)

## 2017-07-23 LAB — CBC WITH DIFFERENTIAL (CANCER CENTER ONLY)
BASOS ABS: 0 10*3/uL (ref 0.0–0.1)
BASOS PCT: 0 %
EOS PCT: 0 %
Eosinophils Absolute: 0 10*3/uL (ref 0.0–0.5)
HCT: 28 % — ABNORMAL LOW (ref 38.4–49.9)
HEMOGLOBIN: 9.6 g/dL — AB (ref 13.0–17.1)
LYMPHS ABS: 0.6 10*3/uL — AB (ref 0.9–3.3)
Lymphocytes Relative: 9 %
MCH: 30.7 pg (ref 27.2–33.4)
MCHC: 34.3 g/dL (ref 32.0–36.0)
MCV: 89.5 fL (ref 79.3–98.0)
Monocytes Absolute: 0.3 10*3/uL (ref 0.1–0.9)
Monocytes Relative: 4 %
NEUTROS PCT: 87 %
Neutro Abs: 6 10*3/uL (ref 1.5–6.5)
PLATELETS: 71 10*3/uL — AB (ref 140–400)
RBC: 3.13 MIL/uL — AB (ref 4.20–5.82)
RDW: 23.3 % — ABNORMAL HIGH (ref 11.0–14.6)
WBC: 6.9 10*3/uL (ref 4.0–10.3)

## 2017-07-23 LAB — TSH: TSH: 1.864 u[IU]/mL (ref 0.320–4.118)

## 2017-07-23 MED ORDER — SODIUM CHLORIDE 0.9% FLUSH
10.0000 mL | INTRAVENOUS | Status: DC | PRN
  Filled 2017-07-23: qty 10

## 2017-07-23 MED ORDER — DEXAMETHASONE 4 MG PO TABS: ORAL_TABLET | ORAL | 1 refills | Status: AC

## 2017-07-23 MED ORDER — HEPARIN SOD (PORK) LOCK FLUSH 100 UNIT/ML IV SOLN
500.0000 [IU] | Freq: Once | INTRAVENOUS | Status: DC | PRN
  Filled 2017-07-23: qty 5

## 2017-07-23 MED ORDER — SODIUM CHLORIDE 0.9% FLUSH
10.0000 mL | INTRAVENOUS | Status: DC | PRN
  Administered 2017-07-23: 10 mL
  Filled 2017-07-23: qty 10

## 2017-07-23 MED ORDER — DEXAMETHASONE 4 MG PO TABS: ORAL_TABLET | ORAL | 1 refills | Status: DC

## 2017-07-23 NOTE — Patient Instructions (Signed)
[facility] Discharge Instructions for Patients Receiving Chemotherapy  Today you received the following chemotherapy agents Keytruda, Alimta, and Carboplatin  To help prevent nausea and vomiting after your treatment, we encourage you to take your nausea medication as directed.  If you develop nausea and vomiting that is not controlled by your nausea medication, call the clinic.   BELOW ARE SYMPTOMS THAT SHOULD BE REPORTED IMMEDIATELY:  *FEVER GREATER THAN 100.5 F  *CHILLS WITH OR WITHOUT FEVER  NAUSEA AND VOMITING THAT IS NOT CONTROLLED WITH YOUR NAUSEA MEDICATION  *UNUSUAL SHORTNESS OF BREATH  *UNUSUAL BRUISING OR BLEEDING  TENDERNESS IN MOUTH AND THROAT WITH OR WITHOUT PRESENCE OF ULCERS  *URINARY PROBLEMS  *BOWEL PROBLEMS  UNUSUAL RASH Items with * indicate a potential emergency and should be followed up as soon as possible.  Feel free to call the clinic should you have any questions or concerns. The clinic phone number is [phone].  Please show the CHEMO ALERT CARD at check-in to the Emergency Department and triage nurse.   

## 2017-07-23 NOTE — Progress Notes (Signed)
Per Dr. Benay Spice and Mikey Bussing, no treatment today due to platelets of 71. Infusion RN made aware. Scheduling message sent for reschedule.

## 2017-07-27 ENCOUNTER — Telehealth: Payer: Self-pay | Admitting: Medical Oncology

## 2017-07-27 NOTE — Telephone Encounter (Signed)
appts confirmed.

## 2017-07-28 ENCOUNTER — Ambulatory Visit: Payer: Non-veteran care | Admitting: Internal Medicine

## 2017-07-28 ENCOUNTER — Other Ambulatory Visit: Payer: Non-veteran care

## 2017-07-28 ENCOUNTER — Inpatient Hospital Stay: Payer: Non-veteran care

## 2017-07-28 ENCOUNTER — Encounter: Payer: Non-veteran care | Admitting: Nutrition

## 2017-07-28 ENCOUNTER — Ambulatory Visit: Payer: Non-veteran care

## 2017-08-04 ENCOUNTER — Telehealth: Payer: Self-pay | Admitting: Oncology

## 2017-08-04 ENCOUNTER — Other Ambulatory Visit: Payer: Non-veteran care

## 2017-08-04 ENCOUNTER — Inpatient Hospital Stay: Payer: Non-veteran care

## 2017-08-04 ENCOUNTER — Ambulatory Visit: Payer: Non-veteran care | Admitting: Internal Medicine

## 2017-08-04 ENCOUNTER — Encounter: Payer: Self-pay | Admitting: Oncology

## 2017-08-04 ENCOUNTER — Inpatient Hospital Stay: Payer: Non-veteran care | Attending: Urology

## 2017-08-04 ENCOUNTER — Inpatient Hospital Stay (HOSPITAL_BASED_OUTPATIENT_CLINIC_OR_DEPARTMENT_OTHER): Payer: Non-veteran care | Admitting: Oncology

## 2017-08-04 ENCOUNTER — Ambulatory Visit: Payer: Non-veteran care | Admitting: Nurse Practitioner

## 2017-08-04 ENCOUNTER — Encounter: Payer: Non-veteran care | Admitting: Nutrition

## 2017-08-04 ENCOUNTER — Ambulatory Visit: Payer: Non-veteran care

## 2017-08-04 VITALS — BP 114/81 | HR 81 | Temp 97.9°F | Resp 19 | Wt 142.7 lb

## 2017-08-04 DIAGNOSIS — R5383 Other fatigue: Secondary | ICD-10-CM

## 2017-08-04 DIAGNOSIS — R600 Localized edema: Secondary | ICD-10-CM | POA: Diagnosis not present

## 2017-08-04 DIAGNOSIS — F17210 Nicotine dependence, cigarettes, uncomplicated: Secondary | ICD-10-CM | POA: Diagnosis not present

## 2017-08-04 DIAGNOSIS — J910 Malignant pleural effusion: Secondary | ICD-10-CM

## 2017-08-04 DIAGNOSIS — C3431 Malignant neoplasm of lower lobe, right bronchus or lung: Secondary | ICD-10-CM

## 2017-08-04 DIAGNOSIS — R590 Localized enlarged lymph nodes: Secondary | ICD-10-CM

## 2017-08-04 DIAGNOSIS — J449 Chronic obstructive pulmonary disease, unspecified: Secondary | ICD-10-CM | POA: Diagnosis not present

## 2017-08-04 DIAGNOSIS — C3491 Malignant neoplasm of unspecified part of right bronchus or lung: Secondary | ICD-10-CM

## 2017-08-04 DIAGNOSIS — T451X5S Adverse effect of antineoplastic and immunosuppressive drugs, sequela: Secondary | ICD-10-CM

## 2017-08-04 DIAGNOSIS — D6959 Other secondary thrombocytopenia: Secondary | ICD-10-CM | POA: Insufficient documentation

## 2017-08-04 DIAGNOSIS — B1920 Unspecified viral hepatitis C without hepatic coma: Secondary | ICD-10-CM | POA: Insufficient documentation

## 2017-08-04 DIAGNOSIS — Z79899 Other long term (current) drug therapy: Secondary | ICD-10-CM | POA: Insufficient documentation

## 2017-08-04 DIAGNOSIS — Z5112 Encounter for antineoplastic immunotherapy: Secondary | ICD-10-CM

## 2017-08-04 DIAGNOSIS — Z5111 Encounter for antineoplastic chemotherapy: Secondary | ICD-10-CM

## 2017-08-04 LAB — CBC WITH DIFFERENTIAL (CANCER CENTER ONLY)
BASOS ABS: 0 10*3/uL (ref 0.0–0.1)
BASOS PCT: 0 %
Eosinophils Absolute: 0 10*3/uL (ref 0.0–0.5)
Eosinophils Relative: 0 %
HEMATOCRIT: 26.3 % — AB (ref 38.4–49.9)
Hemoglobin: 8.9 g/dL — ABNORMAL LOW (ref 13.0–17.1)
LYMPHS PCT: 22 %
Lymphs Abs: 1.4 10*3/uL (ref 0.9–3.3)
MCH: 31.6 pg (ref 27.2–33.4)
MCHC: 33.8 g/dL (ref 32.0–36.0)
MCV: 93.3 fL (ref 79.3–98.0)
Monocytes Absolute: 0.4 10*3/uL (ref 0.1–0.9)
Monocytes Relative: 7 %
NEUTROS ABS: 4.6 10*3/uL (ref 1.5–6.5)
NEUTROS PCT: 71 %
Platelet Count: 77 10*3/uL — ABNORMAL LOW (ref 140–400)
RBC: 2.82 MIL/uL — ABNORMAL LOW (ref 4.20–5.82)
RDW: 21 % — ABNORMAL HIGH (ref 11.0–14.6)
WBC Count: 6.5 10*3/uL (ref 4.0–10.3)

## 2017-08-04 LAB — CMP (CANCER CENTER ONLY)
ALBUMIN: 1.9 g/dL — AB (ref 3.5–5.0)
ALT: 20 U/L (ref 0–55)
AST: 39 U/L — AB (ref 5–34)
Alkaline Phosphatase: 135 U/L (ref 40–150)
Anion gap: 6 (ref 3–11)
BILIRUBIN TOTAL: 1.1 mg/dL (ref 0.2–1.2)
BUN: 8 mg/dL (ref 7–26)
CALCIUM: 8.2 mg/dL — AB (ref 8.4–10.4)
CO2: 21 mmol/L — ABNORMAL LOW (ref 22–29)
Chloride: 106 mmol/L (ref 98–109)
Creatinine: 0.81 mg/dL (ref 0.70–1.30)
GFR, Est AFR Am: >60 mL/min (ref >60)
GLUCOSE: 112 mg/dL (ref 70–140)
Potassium: 3.8 mmol/L (ref 3.5–5.1)
Sodium: 133 mmol/L — ABNORMAL LOW (ref 136–145)
TOTAL PROTEIN: 7 g/dL (ref 6.4–8.3)

## 2017-08-04 NOTE — Progress Notes (Signed)
Chad Sparks [id number]  DIAGNOSIS: Stage IV (T3,N2, M1 a) non-small cell lung cancer, adenocarcinoma presented with large right lower lobe lung mass in addition to mediastinal lymphadenopathy, bilateral pulmonary nodules as well as highly suspicious malignant pleural effusion diagnosed and February 2018. The patient was supposed to start stereotactic body radiotherapy to the early stage disease at that time but he was lost to follow-up and did not show up for his radiotherapy.  PD-L1 0%  PRIOR THERAPY: None.  CURRENT THERAPY: Palliative systemic chemotherapy with carboplatin for AUC of 5, Alimta 500 mg/M2 and Keytruda 200 mg IV every 3 weeks.First dose of 06/02/2017.  Status post 1 cycle.  INTERVAL HISTORY: Chad Sparks. 67 y.o. male returns for routine follow-up visit accompanied by his sister.  The patient is feeling fine today and has no specific complaints except for fatigue and ongoing swelling to his bilateral lower extremities.  The patient denies fevers and chills.  He denies chest pain, shortness of breath, hemoptysis.  He has a nonproductive cough.  He denies nausea, vomiting, constipation, diarrhea.  Reports that his appetite has been decreased but he has not lost any weight.  Denies night sweats denies bleeding.  Patient is treatment has been on hold secondary to thrombocytopenia.  The patient is here for evaluation prior to cycle #2 of his treatment.  MEDICAL HISTORY: Past Medical History:  Diagnosis Date  . Alcohol abuse   . Allergy   . Chronic kidney disease   . COPD (chronic obstructive pulmonary disease) ([place])   . Emphysema of lung ([place])   . Heavy cigarette smoker   . Hepatitis C   . Lung cancer ([place])     ALLERGIES:  is allergic to other.  MEDICATIONS:  Current Outpatient Medications  Medication Sig Dispense Refill  . albuterol (PROVENTIL HFA;VENTOLIN  HFA) 108 (90 Base) MCG/ACT inhaler Inhale 2 puffs into the lungs every 6 (six) hours as needed for wheezing or shortness of breath. 1 Inhaler 3  . Calcium 200 MG TABS Take 200 mg by mouth daily.    . chlorpheniramine-HYDROcodone (TUSSIONEX PENNKINETIC ER) 10-8 MG/5ML SUER Take 5 mLs by mouth every 12 (twelve) hours as needed for cough. 140 mL 0  . dexamethasone (DECADRON) 4 MG tablet 4 mg po bid the day before, day off and day after chemo 40 tablet 1  . ferrous sulfate 325 (65 FE) MG EC tablet Take 1 tablet (325 mg total) by mouth 3 (three) times daily with meals. 90 tablet 3  . fluticasone (FLOVENT HFA) 110 MCG/ACT inhaler Inhale 2 puffs into the lungs 2 (two) times daily. 1 Inhaler 3  . folic acid (FOLVITE) 1 MG tablet Take 1 tablet (1 mg total) by mouth daily. 30 tablet 4  . gabapentin (NEURONTIN) 100 MG capsule Take 1 capsule (100 mg total) by mouth at bedtime. 30 capsule 0  . ipratropium (ATROVENT HFA) 17 MCG/ACT inhaler Inhale 2 puffs into the lungs every 6 (six) hours as needed for wheezing. 1 Inhaler 12  . lidocaine-prilocaine (EMLA) cream Apply 1 application topically as needed. 30 g 0  . metoCLOPramide (REGLAN) 10 MG tablet Take 1 tablet (10 mg total) by mouth 3 (three) times daily before meals. 30 tablet 0  . omeprazole (PRILOSEC) 40 MG capsule Take 1 capsule (40 mg total) by mouth 2 (two) times daily. 30 capsule 1  . prochlorperazine (COMPAZINE) 10 MG tablet Take 1 tablet (10  mg total) by mouth every 6 (six) hours as needed for nausea or vomiting. 30 tablet 0  . tamsulosin (FLOMAX) 0.4 MG CAPS capsule Take 1 capsule (0.4 mg total) by mouth daily. 30 capsule 3  . Vitamin D, Ergocalciferol, (DRISDOL) 50000 units CAPS capsule Take 50,000 Units by mouth every 7 (seven) days.     No current facility-administered medications for this visit.     SURGICAL HISTORY:  Past Surgical History:  Procedure Laterality Date  . ESOPHAGOGASTRODUODENOSCOPY (EGD) WITH PROPOFOL N/A 10/15/2014   Procedure:  ESOPHAGOGASTRODUODENOSCOPY (EGD) WITH PROPOFOL;  Surgeon: Chad Horner, MD;  Location[facility] ENDOSCOPY;  Service: Endoscopy;  Laterality: N/A;  . ESOPHAGOGASTRODUODENOSCOPY (EGD) WITH PROPOFOL N/A 06/21/2017   Procedure: ESOPHAGOGASTRODUODENOSCOPY (EGD) WITH PROPOFOL;  Surgeon: Chad Essex, MD;  Location: [facility] ENDOSCOPY;  Service: Endoscopy;  Laterality: N/A;  . IR FLUORO GUIDE PORT INSERTION RIGHT  06/16/2017  . IR US GUIDE VASC ACCESS RIGHT  06/16/2017    REVIEW OF SYSTEMS:   Review of Systems  Constitutional: Negative for chills, fever and unexpected weight change. Positive for decreased appetite and weight loss. HENT:   Negative for mouth sores, nosebleeds, sore throat and trouble swallowing.   Eyes: Negative for eye problems and icterus.  Respiratory: Negative for hemoptysis, shortness of breath and wheezing.  Positive for nonproductive cough.  Cardiovascular: Negative for chest pain. Positive for lower extremity edema. Gastrointestinal: Negative for abdominal pain, constipation, diarrhea, nausea and vomiting.  Genitourinary: Negative for bladder incontinence, difficulty urinating, dysuria, frequency and hematuria.   Musculoskeletal: Negative for back pain, neck pain and neck stiffness.  Skin: Negative for itching and rash.  Neurological: Negative for dizziness, extremity weakness, headaches, light-headedness and seizures.  Hematological: Negative for adenopathy. Does not bruise/bleed easily.  Psychiatric/Behavioral: Negative for confusion, depression and sleep disturbance. The patient is not nervous/anxious.     PHYSICAL EXAMINATION:  Blood pressure 114/81, pulse 81, temperature 97.9 F (36.6 C), resp. rate 19, weight 142 lb 11.2 oz (64.7 kg), SpO2 100 %.  ECOG PERFORMANCE STATUS: 1 - Symptomatic but completely ambulatory  Physical Exam  Constitutional: Oriented to person, place, and time. No distress.  HENT:  Head: Normocephalic and atraumatic.  Mouth/Throat: Oropharynx is clear and  moist. No oropharyngeal exudate.  Eyes: Conjunctivae are normal. Right eye exhibits no discharge. Left eye exhibits no discharge. No scleral icterus.  Neck: Normal range of motion. Neck supple.  Cardiovascular: Normal rate, regular rhythm, normal heart sounds and intact distal pulses.  1+ bilateral lower extremity edema. Pulmonary/Chest: Effort normal and breath sounds normal. No respiratory distress. No wheezes. No rales.  Abdominal: Soft. Bowel sounds are normal. Exhibits no distension and no mass. There is no tenderness.  Musculoskeletal: Normal range of motion.   Lymphadenopathy:    No cervical adenopathy.  Neurological: Alert and oriented to person, place, and time. Exhibits normal muscle tone. Coordination normal.  Skin: Skin is warm and dry. No rash noted. Not diaphoretic. No erythema. No pallor.  Psychiatric: Mood, memory and judgment normal.  Vitals reviewed.  LABORATORY DATA: Lab Results  Component Value Date   WBC 6.5 08/04/2017   HGB 8.9 (L) 08/04/2017   HCT 26.3 (L) 08/04/2017   MCV 93.3 08/04/2017   PLT 77 (L) 08/04/2017      Chemistry      Component Value Date/Time   NA 133 (L) 08/04/2017 0910   K 3.8 08/04/2017 0910   CL 106 08/04/2017 0910   CO2 21 (L) 08/04/2017 0910   BUN 8 08/04/2017  0910   CREATININE 0.81 08/04/2017 0910      Component Value Date/Time   CALCIUM 8.2 (L) 08/04/2017 0910   ALKPHOS 135 08/04/2017 0910   AST 39 (H) 08/04/2017 0910   ALT 20 08/04/2017 0910   BILITOT 1.1 08/04/2017 0910       RADIOGRAPHIC STUDIES:  No results found.   ASSESSMENT/PLAN:  Primary cancer of right lower lobe of lung [facility]) This is a very pleasant 67 year old African-American male recently diagnosed with stage IV (T3,N2, M1 a) non-small cell lung cancer, adenocarcinoma presented with large right lower lobe lung mass in addition to mediastinal lymphadenopathy, bilateral pulmonary nodules as well as highly suspicious malignant pleural effusion diagnosed and  February 2018. The patient was supposed to start stereotactic body radiotherapy to the early stage disease at that time but he was lost to follow-up and did not show up for his radiotherapy. The patient is currently on palliative systemic chemotherapy with carboplatin for AUC of 5, Alimta 500 mg/M2 and Keytruda 200 mg IV every 3 weeks. Status post 1 cycle which he tolerated fairly well.  Treatment has remained on hold secondary to thrombocytopenia.  The patient was seen with Dr. Julien Nordmann.  Lab results were reviewed with the patient and his sister and the patient has persistent thrombocytopenia.  His treatment will need to remain on hold.  The patient's PDL 1 is negative and there was not enough tissue to send for molecular studies.  We discussed proceeding with [facility] 360 testing to see if we can use targeted therapy for this patient.  If there are any mutations, we will consider starting him on targeted therapy.  Blood work was sent today for testing.  We will bring the patient back in approximately 2 weeks to discuss the results of guardant 360 testing and further treatment options.  For lower extremity edema, his Doppler ultrasound was negative.  Recommend that he elevate his legs as much as possible.  We also discussed reducing his sodium intake and obtaining compression stockings.  The patient was advised to call immediately if he has any concerning symptoms in the interval. The patient voices understanding of current disease status and treatment options and is in agreement with the current care plan.  All questions were answered. The patient knows to call the clinic with any problems, questions or concerns. We can certainly see the patient much sooner if necessary.   Orders Placed This Encounter  Procedures  . [facility]    Standing Status:   Future    Number of Occurrences:   1    Standing Expiration Date:   08/04/2018   Mikey Bussing, DNP, AGPCNP-BC,  AOCNP 08/04/17  ADDENDUM: Hematology/Oncology Attending: I had a face-to-face encounter with the patient today.  I recommended his care plan.  This is a very pleasant 67 years old African-American male with metastatic non-small cell lung cancer, adenocarcinoma status post 1 cycle of systemic chemotherapy with carboplatin, Alimta and Ketruda (pembrolizumab) that was giving several weeks ago.  He has persistent thrombocytopenia since that time and we were unable to start cycle #2 of his treatment.  PDL 1 expression was reported to be negative but there was insufficient material for molecular studies. I recommended for the patient to have molecular studies by blood test from [place] [facility]. I will arrange for the patient to come back for follow-up visit in 2 weeks for evaluation and discussion of his treatment options based on the molecular studies.  He could resume his systemic  therapy if there is improvement of his platelet count by the time. The patient was advised to call immediately if he has any concerning symptoms in the interval.  Disclaimer: This note was dictated with voice recognition software. Similar sounding words can inadvertently be transcribed and may be missed upon review. Eilleen Kempf, MD 08/04/17

## 2017-08-04 NOTE — Assessment & Plan Note (Addendum)
This is a very pleasant 67 year old African-American male recently diagnosed with stage IV (T3,N2, M1 a) non-small cell lung cancer, adenocarcinoma presented with large right lower lobe lung mass in addition to mediastinal lymphadenopathy, bilateral pulmonary nodules as well as highly suspicious malignant pleural effusion diagnosed and February 2018. The patient was supposed to start stereotactic body radiotherapy to the early stage disease at that time but he was lost to follow-up and did not show up for his radiotherapy. The patient is currently on palliative systemic chemotherapy with carboplatin for AUC of 5, Alimta 500 mg/M2 and Keytruda 200 mg IV every 3 weeks. Status post 1 cycle which he tolerated fairly well.  Treatment has remained on hold secondary to thrombocytopenia.  The patient was seen with Dr. Julien Nordmann.  Lab results were reviewed with the patient and his sister and the patient has persistent thrombocytopenia.  His treatment will need to remain on hold.  The patient's PDL 1 is negative and there was not enough tissue to send for molecular studies.  We discussed proceeding with guardant 360 testing to see if we can use targeted therapy for this patient.  If there are any mutations, we will consider starting him on targeted therapy.  Blood work was sent today for testing.  We will bring the patient back in approximately 2 weeks to discuss the results of guardant 360 testing and further treatment options.  For lower extremity edema, his Doppler ultrasound was negative.  Recommend that he elevate his legs as much as possible.  We also discussed reducing his sodium intake and obtaining compression stockings.  The patient was advised to call immediately if he has any concerning symptoms in the interval. The patient voices understanding of current disease status and treatment options and is in agreement with the current care plan.  All questions were answered. The patient knows to call the  clinic with any problems, questions or concerns. We can certainly see the patient much sooner if necessary.

## 2017-08-04 NOTE — Telephone Encounter (Signed)
Scheduled appt per 6/5 los - spoke with sister [name] , she is aware of appt changes.

## 2017-08-11 ENCOUNTER — Telehealth: Payer: Self-pay | Admitting: *Deleted

## 2017-08-11 ENCOUNTER — Other Ambulatory Visit: Payer: Non-veteran care

## 2017-08-11 ENCOUNTER — Ambulatory Visit: Payer: Non-veteran care | Admitting: Nurse Practitioner

## 2017-08-11 ENCOUNTER — Encounter: Payer: Non-veteran care | Admitting: Nutrition

## 2017-08-11 ENCOUNTER — Ambulatory Visit: Payer: Non-veteran care

## 2017-08-11 NOTE — Telephone Encounter (Signed)
"  [facility], [phone] calling to obtain a copy of Veteran's authorization number you all should have on file for this patient."  Routed to collaborative, call information to H.I.M.  No authorization viewed with Epic search at this time.

## 2017-08-16 ENCOUNTER — Telehealth: Payer: Self-pay | Admitting: Medical Oncology

## 2017-08-16 NOTE — Telephone Encounter (Signed)
Chad Sparks said pt seen at [facility] [place] medical team referred pt to Hospice. Per Dr Julien Nordmann I told Chad Sparks that the Guardant 360 results were not helpful in terms of other treatment and Dr Julien Nordmann agrees with Hospice.

## 2017-08-18 ENCOUNTER — Other Ambulatory Visit: Payer: Non-veteran care

## 2017-08-18 ENCOUNTER — Ambulatory Visit: Payer: Non-veteran care | Admitting: Oncology

## 2017-08-18 ENCOUNTER — Inpatient Hospital Stay: Payer: Non-veteran care | Admitting: Internal Medicine

## 2017-08-18 ENCOUNTER — Inpatient Hospital Stay: Payer: Non-veteran care

## 2017-08-18 ENCOUNTER — Ambulatory Visit: Payer: Non-veteran care

## 2017-08-18 LAB — GUARDANT 360

## 2017-08-25 ENCOUNTER — Ambulatory Visit: Payer: Non-veteran care | Admitting: Oncology

## 2017-08-25 ENCOUNTER — Other Ambulatory Visit: Payer: Non-veteran care

## 2017-08-25 ENCOUNTER — Ambulatory Visit: Payer: Non-veteran care

## 2017-09-01 ENCOUNTER — Other Ambulatory Visit: Payer: Non-veteran care

## 2017-09-01 ENCOUNTER — Ambulatory Visit: Payer: Non-veteran care

## 2017-09-01 ENCOUNTER — Ambulatory Visit: Payer: Non-veteran care | Admitting: Internal Medicine

## 2017-10-21 ENCOUNTER — Emergency Department (HOSPITAL_COMMUNITY)
Admission: EM | Admit: 2017-10-21 | Discharge: 2017-10-21 | Disposition: A | Discharge status: Home / Self Care | Attending: Emergency Medicine | Admitting: Emergency Medicine

## 2017-10-21 ENCOUNTER — Emergency Department (HOSPITAL_COMMUNITY)

## 2017-10-21 ENCOUNTER — Other Ambulatory Visit: Payer: Self-pay

## 2017-10-21 ENCOUNTER — Encounter (HOSPITAL_COMMUNITY): Payer: Self-pay

## 2017-10-21 DIAGNOSIS — R0602 Shortness of breath: Secondary | ICD-10-CM | POA: Insufficient documentation

## 2017-10-21 DIAGNOSIS — F17210 Nicotine dependence, cigarettes, uncomplicated: Secondary | ICD-10-CM | POA: Insufficient documentation

## 2017-10-21 DIAGNOSIS — Z79899 Other long term (current) drug therapy: Secondary | ICD-10-CM | POA: Insufficient documentation

## 2017-10-21 DIAGNOSIS — Z85118 Personal history of other malignant neoplasm of bronchus and lung: Secondary | ICD-10-CM | POA: Diagnosis not present

## 2017-10-21 DIAGNOSIS — F1010 Alcohol abuse, uncomplicated: Secondary | ICD-10-CM | POA: Diagnosis not present

## 2017-10-21 DIAGNOSIS — N189 Chronic kidney disease, unspecified: Secondary | ICD-10-CM | POA: Diagnosis not present

## 2017-10-21 DIAGNOSIS — J449 Chronic obstructive pulmonary disease, unspecified: Secondary | ICD-10-CM | POA: Diagnosis not present

## 2017-10-21 NOTE — ED Provider Notes (Signed)
[facility] DEPT Provider Note   CSN: [id number] Arrival date & time: 10/21/17  1246     History   Chief Complaint Chief Complaint  Patient presents with  . Shortness of Breath    HPI Chad Ingalsbe. is a 67 y.o. male.  HPI Chad Harju. is a 67 y.o. male with history of alcohol abuse, COPD, smoker, current stage IV lung cancer on hospice, presents to emergency department with complaint of shortness of breath.  Patient currently lives with his sister and has a home aide that comes in for 3 hours daily, and hospice nurse that checks on him 3 days a week.  Patient is not ambulatory.  Sister states that he was at home when he called out for help.  When she got to him he stated that he was short of breath and would like to go to the hospital.  Patient was placed on oxygen by EMS and states he feels fine now.  Patient denies any other complaints at this time, but states he is just not feeling well.  Sister reports lower extremity edema that is been there for several months.  No other complaints  Past Medical History:  Diagnosis Date  . Alcohol abuse   . Allergy   . Chronic kidney disease   . COPD (chronic obstructive pulmonary disease) ([place])   . Emphysema of lung ([place])   . Heavy cigarette smoker   . Hepatitis C   . Lung cancer [facility])     Patient Active Problem List   Diagnosis Date Noted  . Thrombocytopenia ([place]) 07/07/2017  . Anemia due to antineoplastic chemotherapy 06/22/2017  . Protein-calorie malnutrition, severe 06/17/2017  . Pressure injury of skin 06/17/2017  . Pleural effusion   . Syncope and collapse 06/16/2017  . Cough syncope   . Goals of care, counseling/discussion 06/02/2017  . Encounter for antineoplastic chemotherapy 06/02/2017  . Encounter for antineoplastic immunotherapy 06/02/2017  . Primary cancer of right lower lobe of lung ([place]) 05/25/2016  . GERD (gastroesophageal reflux disease) 01/31/2015  . Lower extremity  pain 01/31/2015  . Anemia 01/31/2015  . Symptomatic anemia 11/26/2014  . CKD (chronic kidney disease) 11/26/2014  . Pedal edema 11/26/2014  . GIB (gastrointestinal bleeding) 10/15/2014  . AKI (acute kidney injury) ([place]) 10/15/2014  . Pancytopenia ([place]) 10/15/2014  . Hepatitis C   . COPD (chronic obstructive pulmonary disease) ([place])   . Heavy cigarette smoker   . Alcohol abuse   . Acute hepatitis C virus infection without hepatic coma   . Acute upper GI bleed     Past Surgical History:  Procedure Laterality Date  . ESOPHAGOGASTRODUODENOSCOPY (EGD) WITH PROPOFOL N/A 10/15/2014   Procedure: ESOPHAGOGASTRODUODENOSCOPY (EGD) WITH PROPOFOL;  Surgeon: Wonda Horner, MD;  Location: [facility] ENDOSCOPY;  Service: Endoscopy;  Laterality: N/A;  . ESOPHAGOGASTRODUODENOSCOPY (EGD) WITH PROPOFOL N/A 06/21/2017   Procedure: ESOPHAGOGASTRODUODENOSCOPY (EGD) WITH PROPOFOL;  Surgeon: Clarene Essex, MD;  Location: [facility] ENDOSCOPY;  Service: Endoscopy;  Laterality: N/A;  . IR FLUORO GUIDE PORT INSERTION RIGHT  06/16/2017  . IR US GUIDE VASC ACCESS RIGHT  06/16/2017        Home Medications    Prior to Admission medications   Medication Sig Start Date End Date Taking? Authorizing Provider  ARTIFICIAL SALIVA MT Use as directed 4 sprays in the mouth or throat 2 (two) times daily.   Yes [provider]  gabapentin (NEURONTIN) 300 MG capsule Take 300 mg by mouth 3 (three) times  daily.   Yes [provider]  albuterol (PROVENTIL HFA;VENTOLIN HFA) 108 (90 Base) MCG/ACT inhaler Inhale 2 puffs into the lungs every 6 (six) hours as needed for wheezing or shortness of breath. 04/25/15   Saguier, Percell Miller, PA-C  Calcium 200 MG TABS Take 200 mg by mouth daily.    [provider]  chlorpheniramine-HYDROcodone (TUSSIONEX PENNKINETIC ER) 10-8 MG/5ML SUER Take 5 mLs by mouth every 12 (twelve) hours as needed for cough. Patient not taking: Reported on 10/21/2017 06/22/17   Charlynne Cousins, MD    dexamethasone (DECADRON) 4 MG tablet 4 mg po bid the day before, day off and day after chemo Patient not taking: Reported on 10/21/2017 07/23/17   Maryanna Shape, NP  ferrous sulfate 325 (65 FE) MG EC tablet Take 1 tablet (325 mg total) by mouth 3 (three) times daily with meals. 07/07/17   Maryanna Shape, NP  fluticasone (FLOVENT HFA) 110 MCG/ACT inhaler Inhale 2 puffs into the lungs 2 (two) times daily. Patient not taking: Reported on 10/21/2017 04/25/15   Saguier, Percell Miller, PA-C  folic acid (FOLVITE) 1 MG tablet Take 1 tablet (1 mg total) by mouth daily. 04/27/17   Curt Bears, MD  gabapentin (NEURONTIN) 100 MG capsule Take 1 capsule (100 mg total) by mouth at bedtime. Patient not taking: Reported on 10/21/2017 02/06/15   Saguier, Percell Miller, PA-C  ipratropium (ATROVENT HFA) 17 MCG/ACT inhaler Inhale 2 puffs into the lungs every 6 (six) hours as needed for wheezing. Patient not taking: Reported on 10/21/2017 01/31/15   Saguier, Percell Miller, PA-C  lidocaine-prilocaine (EMLA) cream Apply 1 application topically as needed. 06/02/17   Maryanna Shape, NP  metoCLOPramide (REGLAN) 10 MG tablet Take 1 tablet (10 mg total) by mouth 3 (three) times daily before meals. Patient not taking: Reported on 10/21/2017 02/20/15   Saguier, Percell Miller, PA-C  omeprazole (PRILOSEC) 40 MG capsule Take 1 capsule (40 mg total) by mouth 2 (two) times daily. 06/22/17 06/22/18  Charlynne Cousins, MD  prochlorperazine (COMPAZINE) 10 MG tablet Take 1 tablet (10 mg total) by mouth every 6 (six) hours as needed for nausea or vomiting. 04/27/17   Curt Bears, MD  tamsulosin (FLOMAX) 0.4 MG CAPS capsule Take 1 capsule (0.4 mg total) by mouth daily. Patient not taking: Reported on 10/21/2017 02/20/15   Saguier, Percell Miller, PA-C  traMADol (ULTRAM) 50 MG tablet Take 50 mg by mouth every 6 (six) hours as needed. for pain 09/06/17   [provider]    Family History Family History  Problem Relation Age of Onset  . Throat cancer Mother    . Cancer Mother        throat  . Emphysema Father   . Brain cancer Brother   . Cancer Brother        brain  . Cancer Paternal Grandmother        leukemia    Social History Social History   Tobacco Use  . Smoking status: Current Every Day Smoker    Packs/day: 0.50    Years: 53.00    Pack years: 26.50    Types: Cigarettes  . Smokeless tobacco: Never Used  Substance Use Topics  . Alcohol use: Yes    Comment:  2-4 beers a month.  . Drug use: No     Allergies   Other   Review of Systems Review of Systems  Constitutional: Negative for chills and fever.  Respiratory: Positive for shortness of breath. Negative for cough and chest tightness.  Cardiovascular: Positive for leg swelling. Negative for chest pain and palpitations.  Gastrointestinal: Negative for abdominal distention, abdominal pain, diarrhea, nausea and vomiting.  Genitourinary: Negative for dysuria, frequency, hematuria and urgency.  Musculoskeletal: Positive for arthralgias. Negative for myalgias, neck pain and neck stiffness.  Skin: Negative for rash.  Allergic/Immunologic: Negative for immunocompromised state.  Neurological: Negative for dizziness, weakness, light-headedness, numbness and headaches.     Physical Exam Updated Vital Signs BP (!) 126/96   Pulse (!) 105   Temp 97.7 F (36.5 C) (Oral)   Resp 19   Ht 6\' 2"  (1.88 m)   Wt 59 kg   SpO2 100%   BMI 16.69 kg/m   Physical Exam  Constitutional: He appears well-developed.  Cachectic  HENT:  Head: Normocephalic and atraumatic.  Eyes: Conjunctivae are normal.  Neck: Neck supple.  Cardiovascular: Normal rate, regular rhythm and normal heart sounds.  Pulmonary/Chest: Effort normal. No respiratory distress. He has wheezes. He has rales.  Abdominal: Soft. Bowel sounds are normal. He exhibits no distension. There is no tenderness. There is no rebound.  Musculoskeletal: He exhibits edema.  Ankle and foot edema bilaterally, pitting, 3+   Neurological: He is alert.  Skin: Skin is warm and dry.  Nursing note and vitals reviewed.    ED Treatments / Results  Labs (all labs ordered are listed, but only abnormal results are displayed) Labs Reviewed - No data to display  EKG None  Radiology Dg Chest 2 View  Result Date: 10/21/2017 CLINICAL DATA:  History of lung carcinoma in the right lower lobe the. EXAM: CHEST - 2 VIEW COMPARISON:  Portable chest x-ray of 06/18/2016, CT chest of 06/17/2008, and PET-CT of 03/17/2016 FINDINGS: There is now complete opacification of the right hemithorax with no significant mediastinal shift. Therefore the there is most likely a combination of large right pleural effusion as well as volume loss on the right. Mild atelectasis at the left lung base is noted. Heart size appears grossly stable although difficult to assess. No acute abnormality is seen. IMPRESSION: Complete opacification of the right hemithorax consistent with large right effusion as well as volume loss, with no significant mediastinal shift. Electronically Signed   By: Ivar Drape M.D.   On: 10/21/2017 16:04    Procedures Procedures (including critical care time)  Medications Ordered in ED Medications - No data to display   Initial Impression / Assessment and Plan / ED Course  I have reviewed the triage vital signs and the nursing notes.  Pertinent labs & imaging results that were available during my care of the patient were reviewed by me and considered in my medical decision making (see chart for details).     Patient with hospice, stage IV lung disease, here for episode of shortness of breath this morning.  He appears to be comfortable at this time denies any shortness of breath.  He is on oxygen, will turn oxygen down to see if he gets hypoxic and see if he becomes more short of breath then.  I will get a chest x-ray.  Spoke with the hospice nurse, they are aware patient is here   6:11 PM Patient with a large effusion,  worsening on today's x-ray.  We do not have an x-ray to compare this to since April, which was 4 months ago.  Patient is alert and oriented and in the department, he is sitting upright and drinking water, he is in no acute distress, he is able to talk in full sentences.  He does not appear to be in any distress.  His oxygen saturation 100% on room air and has been the entire time even without oxygen.  I did discuss patient with Dr. Ammie Dalton with oncology, who agrees that patient does not need to be admitted at this time, but may be benefits from draining the effusion for comfort.  I discussed results with both sisters, and with the patient, we will have him call Dr. Julien Nordmann in the morning and have him review the x-ray to see if he agrees with assessment and to see if this procedure is warranted.  I also spoke with the hospice nurse and discussed the plan.  Patient will have home health aide as well as a nurse come by tomorrow to help him and to check on him.  We will arrange transportation back to the house tonight.  Patient's family is very anxious about the transportation situation and worried that he may not be able to be brought to the oncology office tomorrow.  Again I do not think he needs to be admitted at this time.  We did discuss strict return precautions if he is getting worse.  Vitals:   10/21/17 1308 10/21/17 1309 10/21/17 1312 10/21/17 1315  BP:  107/85  (!) 126/96  Pulse:  (!) 103  (!) 105  Resp:  16  19  Temp:  97.7 F (36.5 C)    TempSrc:  Oral    SpO2: 100% 100%  100%  Weight:   59 kg   Height:   6\' 2"  (1.88 m)      Final Clinical Impressions(s) / ED Diagnoses   Final diagnoses:  None    ED Discharge Orders    None       Jeannett Senior, PA-C 10/21/17 1813    Milton Ferguson, MD 10/21/17 2323

## 2017-10-21 NOTE — ED Notes (Signed)
[facility] called for transport

## 2017-10-21 NOTE — Discharge Instructions (Addendum)
Please call Dr. Julien Nordmann tomorrow morning to discuss further treatment plan.  Return to emergency department worsening.

## 2017-10-21 NOTE — Progress Notes (Signed)
[facility] RN note.  Notified by [facility] primary nurse that family member activated EMS when patient stated he could not breath. Patient transported to [facility] ED for evaluation.   Visited patient at bedside with sister present. Patient is sleeping and is in no apparent distress.  He is receiving O2 @ 3L [place] with SpO2 of 100. Patient has not been assessed by physician yet.   Please call with any hospice related questions or concerns.   Please use [facility] for transport if ambulance transport is needed at discharge.  Farrel Gordon, RN, CCM [facility] hospital liaison [phone]  [facility] hospital liaisons are on [place]

## 2017-10-21 NOTE — ED Triage Notes (Signed)
Per EMS: Pt c/o of SOB.  Pt hx of stage 4 lung CA and hospice pt.  Pt has no c/o after being placed on 3L [place].

## 2017-10-21 NOTE — ED Notes (Signed)
Bed: WA08 Expected date:  Expected time:  Means of arrival:  Comments: EMS-SOB-lung cancer

## 2017-10-22 ENCOUNTER — Other Ambulatory Visit: Payer: Self-pay | Admitting: Internal Medicine

## 2017-10-22 DIAGNOSIS — C3431 Malignant neoplasm of lower lobe, right bronchus or lung: Secondary | ICD-10-CM

## 2017-11-30 DEATH — deceased

## 2019-04-05 IMAGING — CT NM PET TUM IMG RESTAG (PS) SKULL BASE T - THIGH
1 of 8 series · 1 of 25 positions shown · non-contrast
Comparison: 04/01/2016 PET

CLINICAL DATA: Subsequent treatment strategy for right lower lobe
lung cancer. Non-small-cell. Restaging..

EXAM:
NUCLEAR MEDICINE PET SKULL BASE TO THIGH
TECHNIQUE: 7.3 mCi F-18 FDG was injected intravenously. Full-ring PET imaging
was performed from the skull base to thigh after the radiotracer. CT
data was obtained and used for attenuation correction and anatomic
localization.
FASTING BLOOD GLUCOSE:  Value: 82 mg/dl

[Series 3: pet sk_thigh ac · axial · 5.0mm · 4.07mm/px · 1 of 229 slices shown]
[im 115/229]
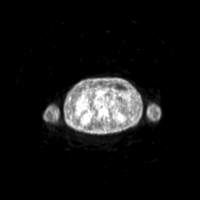

[1 of 25 positions shown; findings below may reference images not displayed]

FINDINGS: NECK: No areas of abnormal hypermetabolism. Cerebral and cerebellar
atrophy which is age advanced. No cervical adenopathy. Left carotid
calcified atherosclerosis.

CHEST: Progression of right lower lobe lung mass. Hypermetabolism
now fills the majority of the right lower lobe, with surrounding
airspace disease which is likely related to postobstructive
pneumonitis. The central hypermetabolic component measures a S.U.V.
max of 14.1 versus a S.U.V. max of 6.0 on the prior. Foci of more
peripheral hypermetabolism about the right lung are likely pleural
based and new. Example at a S.U.V. max of 4.7 including on
approximately image 69/series 4.

Bilateral hypermetabolic pulmonary nodules. A new anterior right
lung base nodule measures 13 milli meters and a S.U.V. max of 2.9 on
image 47/series 8.

A left lower lobe pulmonary nodule is also new at 2.0 cm and a
S.U.V. max of 5.5 on image 44/series 8.

New hypermetabolic thoracic lymph nodes, including a subcarinal node
which measures 11 mm a S.U.V. max of 7.0 on image 72/series 4.

Lad coronary artery atherosclerosis. Small right pleural effusion is
similar. Right lower lobe endobronchial obstruction is new.

ABDOMEN/PELVIS: No abdominopelvic parenchymal or nodal
hypermetabolism identified. Hyperattenuation in both central kidneys
may represent medullary nephrocalcinosis. A 2.6 cm interpolar left
renal cyst. Moderate volume ascites. Suspect mild cirrhosis. Small
gallstones. Abdominal aortic atherosclerosis.

SKELETON: No abnormal marrow activity. Interval partial healing of
posterior left eleventh rib fracture. No correlate hypermetabolism.
IMPRESSION: 1. Significant progression of right lower lobe primary with
development of pulmonary, thoracic nodal, and probable right pleural
metastasis.
2. No evidence of hypermetabolic extra thoracic metastatic disease.
3. Similar moderate volume ascites.  Suspect mild cirrhosis.
4. Cholelithiasis.
5. Coronary artery atherosclerosis. Aortic Atherosclerosis
([id number]).

## 2019-08-18 ENCOUNTER — Other Ambulatory Visit: Payer: Self-pay | Admitting: Pharmacist
